# Patient Record
Sex: Male | Born: 1945 | Race: White | Hispanic: No | Marital: Single | Smoking: Current every day smoker
Health system: Southern US, Community
[De-identification: ages and names within clinical notes are randomized; demographics above are authoritative.]

## PROBLEM LIST (undated history)

## (undated) DIAGNOSIS — G459 Transient cerebral ischemic attack, unspecified: Secondary | ICD-10-CM

## (undated) DIAGNOSIS — Z8711 Personal history of peptic ulcer disease: Secondary | ICD-10-CM

## (undated) DIAGNOSIS — L408 Other psoriasis: Secondary | ICD-10-CM

## (undated) DIAGNOSIS — I2510 Atherosclerotic heart disease of native coronary artery without angina pectoris: Secondary | ICD-10-CM

## (undated) DIAGNOSIS — I498 Other specified cardiac arrhythmias: Secondary | ICD-10-CM

## (undated) DIAGNOSIS — R066 Hiccough: Secondary | ICD-10-CM

## (undated) DIAGNOSIS — E119 Type 2 diabetes mellitus without complications: Secondary | ICD-10-CM

## (undated) DIAGNOSIS — E785 Hyperlipidemia, unspecified: Secondary | ICD-10-CM

## (undated) DIAGNOSIS — I255 Ischemic cardiomyopathy: Secondary | ICD-10-CM

## (undated) DIAGNOSIS — I119 Hypertensive heart disease without heart failure: Secondary | ICD-10-CM

## (undated) DIAGNOSIS — I5022 Chronic systolic (congestive) heart failure: Secondary | ICD-10-CM

## (undated) DIAGNOSIS — E538 Deficiency of other specified B group vitamins: Secondary | ICD-10-CM

## (undated) DIAGNOSIS — G450 Vertebro-basilar artery syndrome: Secondary | ICD-10-CM

## (undated) DIAGNOSIS — K219 Gastro-esophageal reflux disease without esophagitis: Secondary | ICD-10-CM

## (undated) DIAGNOSIS — N200 Calculus of kidney: Secondary | ICD-10-CM

## (undated) DIAGNOSIS — I739 Peripheral vascular disease, unspecified: Secondary | ICD-10-CM

## (undated) DIAGNOSIS — I82409 Acute embolism and thrombosis of unspecified deep veins of unspecified lower extremity: Secondary | ICD-10-CM

## (undated) DIAGNOSIS — Z8719 Personal history of other diseases of the digestive system: Secondary | ICD-10-CM

## (undated) DIAGNOSIS — I639 Cerebral infarction, unspecified: Secondary | ICD-10-CM

## (undated) DIAGNOSIS — Z720 Tobacco use: Secondary | ICD-10-CM

## (undated) DIAGNOSIS — I6529 Occlusion and stenosis of unspecified carotid artery: Principal | ICD-10-CM

## (undated) HISTORY — PX: CORONARY ARTERY BYPASS GRAFT: SHX141

## (undated) HISTORY — DX: Atherosclerotic heart disease of native coronary artery without angina pectoris: I25.10

## (undated) HISTORY — DX: Other psoriasis: L40.8

## (undated) HISTORY — DX: Hyperlipidemia, unspecified: E78.5

## (undated) HISTORY — DX: Occlusion and stenosis of unspecified carotid artery: I65.29

## (undated) HISTORY — DX: Cerebral infarction, unspecified: I63.9

## (undated) HISTORY — DX: Gastro-esophageal reflux disease without esophagitis: K21.9

## (undated) HISTORY — PX: INSERT / REPLACE / REMOVE PACEMAKER: SUR710

## (undated) HISTORY — DX: Calculus of kidney: N20.0

## (undated) HISTORY — DX: Deficiency of other specified B group vitamins: E53.8

## (undated) HISTORY — DX: Hiccough: R06.6

## (undated) HISTORY — DX: Transient cerebral ischemic attack, unspecified: G45.9

## (undated) HISTORY — DX: Vertebro-basilar artery syndrome: G45.0

## (undated) HISTORY — DX: Acute embolism and thrombosis of unspecified deep veins of unspecified lower extremity: I82.409

## (undated) HISTORY — DX: Other specified cardiac arrhythmias: I49.8

## (undated) HISTORY — PX: CORONARY ANGIOPLASTY WITH STENT PLACEMENT: SHX49

## (undated) HISTORY — PX: KIDNEY STONE SURGERY: SHX686

## (undated) HISTORY — DX: Tobacco use: Z72.0

---

## 1997-06-28 ENCOUNTER — Inpatient Hospital Stay (HOSPITAL_COMMUNITY): Admission: AD | Admit: 1997-06-28 | Discharge: 1997-07-01 | Payer: Self-pay | Admitting: Cardiovascular Disease

## 1998-07-11 ENCOUNTER — Ambulatory Visit (HOSPITAL_COMMUNITY): Admission: RE | Admit: 1998-07-11 | Discharge: 1998-07-11 | Payer: Self-pay | Admitting: Cardiovascular Disease

## 1999-04-12 ENCOUNTER — Encounter: Payer: Self-pay | Admitting: Cardiovascular Disease

## 1999-04-12 ENCOUNTER — Ambulatory Visit (HOSPITAL_COMMUNITY): Admission: RE | Admit: 1999-04-12 | Discharge: 1999-04-12 | Payer: Self-pay | Admitting: Cardiovascular Disease

## 1999-05-04 ENCOUNTER — Encounter: Payer: Self-pay | Admitting: Emergency Medicine

## 1999-05-04 ENCOUNTER — Emergency Department (HOSPITAL_COMMUNITY): Admission: EM | Admit: 1999-05-04 | Discharge: 1999-05-04 | Payer: Self-pay | Admitting: Emergency Medicine

## 1999-06-04 ENCOUNTER — Encounter: Payer: Self-pay | Admitting: *Deleted

## 1999-06-04 ENCOUNTER — Emergency Department (HOSPITAL_COMMUNITY): Admission: EM | Admit: 1999-06-04 | Discharge: 1999-06-04 | Payer: Self-pay | Admitting: *Deleted

## 1999-11-02 ENCOUNTER — Inpatient Hospital Stay (HOSPITAL_COMMUNITY): Admission: EM | Admit: 1999-11-02 | Discharge: 1999-11-05 | Payer: Self-pay | Admitting: Emergency Medicine

## 2000-03-14 ENCOUNTER — Encounter: Payer: Self-pay | Admitting: Emergency Medicine

## 2000-03-15 ENCOUNTER — Inpatient Hospital Stay (HOSPITAL_COMMUNITY): Admission: EM | Admit: 2000-03-15 | Discharge: 2000-03-16 | Payer: Self-pay | Admitting: Emergency Medicine

## 2000-05-01 ENCOUNTER — Encounter: Payer: Self-pay | Admitting: Family Medicine

## 2000-05-01 ENCOUNTER — Encounter: Admission: RE | Admit: 2000-05-01 | Discharge: 2000-05-01 | Payer: Self-pay | Admitting: Family Medicine

## 2002-07-28 ENCOUNTER — Ambulatory Visit (HOSPITAL_COMMUNITY): Admission: RE | Admit: 2002-07-28 | Discharge: 2002-07-28 | Payer: Self-pay | Admitting: Cardiovascular Disease

## 2005-10-13 ENCOUNTER — Emergency Department (HOSPITAL_COMMUNITY): Admission: EM | Admit: 2005-10-13 | Discharge: 2005-10-14 | Payer: Self-pay | Admitting: Emergency Medicine

## 2006-01-31 ENCOUNTER — Inpatient Hospital Stay (HOSPITAL_COMMUNITY): Admission: EM | Admit: 2006-01-31 | Discharge: 2006-02-02 | Payer: Self-pay | Admitting: Emergency Medicine

## 2006-02-01 ENCOUNTER — Encounter: Payer: Self-pay | Admitting: Cardiology

## 2006-03-11 ENCOUNTER — Inpatient Hospital Stay (HOSPITAL_BASED_OUTPATIENT_CLINIC_OR_DEPARTMENT_OTHER): Admission: RE | Admit: 2006-03-11 | Discharge: 2006-03-11 | Payer: Self-pay | Admitting: Cardiovascular Disease

## 2006-09-18 ENCOUNTER — Inpatient Hospital Stay (HOSPITAL_BASED_OUTPATIENT_CLINIC_OR_DEPARTMENT_OTHER): Admission: RE | Admit: 2006-09-18 | Discharge: 2006-09-18 | Payer: Self-pay | Admitting: Cardiovascular Disease

## 2006-09-18 HISTORY — PX: CORONARY ANGIOPLASTY: SHX604

## 2007-08-15 ENCOUNTER — Inpatient Hospital Stay (HOSPITAL_COMMUNITY): Admission: EM | Admit: 2007-08-15 | Discharge: 2007-08-21 | Payer: Self-pay | Admitting: Emergency Medicine

## 2007-08-19 ENCOUNTER — Ambulatory Visit: Payer: Self-pay | Admitting: Physical Medicine & Rehabilitation

## 2007-08-19 ENCOUNTER — Encounter (INDEPENDENT_AMBULATORY_CARE_PROVIDER_SITE_OTHER): Payer: Self-pay | Admitting: Internal Medicine

## 2007-08-19 ENCOUNTER — Ambulatory Visit: Payer: Self-pay | Admitting: Vascular Surgery

## 2008-05-15 ENCOUNTER — Ambulatory Visit: Payer: Self-pay | Admitting: Cardiovascular Disease

## 2008-05-15 ENCOUNTER — Ambulatory Visit: Payer: Self-pay | Admitting: Internal Medicine

## 2008-05-15 ENCOUNTER — Inpatient Hospital Stay (HOSPITAL_COMMUNITY): Admission: EM | Admit: 2008-05-15 | Discharge: 2008-05-28 | Payer: Self-pay | Admitting: Emergency Medicine

## 2008-05-17 ENCOUNTER — Ambulatory Visit: Payer: Self-pay | Admitting: Surgery

## 2008-05-17 ENCOUNTER — Encounter (INDEPENDENT_AMBULATORY_CARE_PROVIDER_SITE_OTHER): Payer: Self-pay | Admitting: *Deleted

## 2008-06-25 ENCOUNTER — Ambulatory Visit: Payer: Self-pay | Admitting: Surgery

## 2008-06-25 ENCOUNTER — Encounter: Admission: RE | Admit: 2008-06-25 | Discharge: 2008-06-25 | Payer: Self-pay | Admitting: Surgery

## 2008-09-20 ENCOUNTER — Encounter: Payer: Self-pay | Admitting: Internal Medicine

## 2008-09-20 DIAGNOSIS — E119 Type 2 diabetes mellitus without complications: Secondary | ICD-10-CM | POA: Insufficient documentation

## 2008-09-20 DIAGNOSIS — L408 Other psoriasis: Secondary | ICD-10-CM

## 2008-09-20 DIAGNOSIS — G450 Vertebro-basilar artery syndrome: Secondary | ICD-10-CM | POA: Insufficient documentation

## 2008-09-20 DIAGNOSIS — I10 Essential (primary) hypertension: Secondary | ICD-10-CM

## 2008-09-20 DIAGNOSIS — G459 Transient cerebral ischemic attack, unspecified: Secondary | ICD-10-CM | POA: Insufficient documentation

## 2008-09-20 DIAGNOSIS — I498 Other specified cardiac arrhythmias: Secondary | ICD-10-CM | POA: Insufficient documentation

## 2008-09-20 DIAGNOSIS — I119 Hypertensive heart disease without heart failure: Secondary | ICD-10-CM

## 2008-09-20 DIAGNOSIS — I2510 Atherosclerotic heart disease of native coronary artery without angina pectoris: Secondary | ICD-10-CM | POA: Insufficient documentation

## 2008-09-20 HISTORY — DX: Hypertensive heart disease without heart failure: I11.9

## 2008-09-20 HISTORY — DX: Vertebro-basilar artery syndrome: G45.0

## 2008-09-20 HISTORY — DX: Transient cerebral ischemic attack, unspecified: G45.9

## 2008-09-20 HISTORY — DX: Other psoriasis: L40.8

## 2008-10-08 ENCOUNTER — Ambulatory Visit: Payer: Self-pay | Admitting: Internal Medicine

## 2008-10-08 DIAGNOSIS — Z950 Presence of cardiac pacemaker: Secondary | ICD-10-CM

## 2008-12-17 ENCOUNTER — Emergency Department (HOSPITAL_COMMUNITY): Admission: EM | Admit: 2008-12-17 | Discharge: 2008-12-17 | Payer: Self-pay | Admitting: Emergency Medicine

## 2009-05-19 ENCOUNTER — Ambulatory Visit: Payer: Self-pay | Admitting: Internal Medicine

## 2009-08-18 ENCOUNTER — Encounter: Admission: RE | Admit: 2009-08-18 | Discharge: 2009-08-18 | Payer: Self-pay | Admitting: Neurology

## 2009-10-14 ENCOUNTER — Ambulatory Visit: Payer: Self-pay | Admitting: Cardiology

## 2010-03-25 ENCOUNTER — Encounter: Payer: Self-pay | Admitting: Internal Medicine

## 2010-03-26 ENCOUNTER — Encounter: Payer: Self-pay | Admitting: Neurology

## 2010-03-26 ENCOUNTER — Encounter: Payer: Self-pay | Admitting: Surgery

## 2010-04-04 NOTE — Cardiovascular Report (Signed)
Summary: Office Visit   Office Visit   Imported By: Roderic Ovens 06/01/2009 15:56:33  _____________________________________________________________________  External Attachment:    Type:   Image     Comment:   External Document

## 2010-04-04 NOTE — Assessment & Plan Note (Signed)
Summary: medtronic check/sl   Visit Type:  Follow-up Primary Provider:  Laren Boom, MD, [facility]   History of Present Illness: [name] returns today for followup of his PPM.  He is a pleasant middle aged man with a h/o CAD and stroke who developed bradycardia and underwent PPM in 05/2008.  He returns today with complaints of hiccups.   No c/p or sob.    Current Medications (verified): 1)  Hydrocodone-Acetaminophen 5-500 Mg Tabs (Hydrocodone-Acetaminophen) .... Take One Tablet At Bedtime 2)  Glimepiride 4 Mg Tabs (Glimepiride) .... Take 1 or 1/2 Tablets By Mouth Once Daily 3)  Ranitidine Hcl 150 Mg Caps (Ranitidine Hcl) .... Once Daily 4)  Pantoprazole Sodium 40 Mg Tbec (Pantoprazole Sodium) .... Once Daily 5)  Zetia 10 Mg Tabs (Ezetimibe) .... Once Daily 6)  Lipitor 80 Mg Tabs (Atorvastatin Calcium) .... Once Daily 7)  Toprol Xl 50 Mg Xr24h-Tab (Metoprolol Succinate) .... One Full Tablet Once Daily 8)  Lisinopril-Hydrochlorothiazide 20-12.5 Mg Tabs (Lisinopril-Hydrochlorothiazide) .... Take One Tablet Once Daily 9)  Nitrostat 0.4 Mg Subl (Nitroglycerin) .Marland Kitchen.. 1 Tablet Under Tongue At Onset of Chest Pain; You May Repeat Every 5 Minutes For Up To 3 Doses.  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         c/o hiccups.  Vital Signs:  Patient profile:   65 year old male Height:      64 inches Weight:      186 pounds BMI:     32.04 Pulse rate:    87 / minute BP sitting:   104 / 62  (left arm)  Vitals Entered By: Laurance Flatten CMA (May 19, 2009 3:21 PM) Comments Pt did not bring a current med list to this visit. Nor does he know what medications he is taking.   Physical Exam  General:  Well developed, well nourished, in no acute distress. Head:  normocephalic and atraumatic Eyes:  PERRLA/EOM intact; conjunctiva and lids normal. Mouth:  Teeth, gums and palate normal. Oral mucosa normal. Neck:  Neck supple, no JVD. No masses, thyromegaly or abnormal cervical nodes. Chest Wall:  no deformities or breast masses noted Lungs:  Clear bilaterally to auscultation.  No wheezes, rales or rhonchi. Heart:  Regular tachycardia with normal S1 and S2.  PMI is enlarged and laterally displaced.  No murmurs are present. Abdomen:  Bowel sounds positive; abdomen soft and non-tender without masses, organomegaly, or hernias noted. No hepatosplenomegaly. Msk:  Back normal, normal gait. Muscle strength and tone normal. Pulses:  pulses normal in all 4 extremities Extremities:  No clubbing or cyanosis. Neurologic:  Alert and oriented x 3.   PPM Specifications Following MD:  Lewayne Bunting, MD     Referring MD:  [facility] Vendor:  Medtronic     PPM Model Number:  ADDRL1     PPM Serial Number:  [id number] [facility] PPM DOI:  05/27/2008     PPM Implanting MD:  Lewayne Bunting, MD  Lead 1    Location: RA     DOI: 05/27/2008     Model #: 9147  Serial #: [id number]     Status: active Lead 2    Location: RV     DOI: 05/27/2008     Model #: [id number]     Serial #: [id number]     Status: active  Magnet Response Rate:  BOL  85 ERI  65  Indications:  BRADYCARDIA AFTER CABG   PPM Follow Up Remote Check?  No Battery Voltage:  2.79 V     Battery Est. Longevity:  10 years     Pacer Dependent:  No       PPM Device Measurements Atrium  Amplitude: 2.8 mV, Impedance: 611 ohms, Threshold: 1.0 V at 0.4 msec Right Ventricle  Amplitude: 16 mV, Impedance: 676 ohms, Threshold:  0.75 V at 0.4 msec  Episodes MS Episodes:  1     Percent Mode Switch:  <0.1%     Coumadin:  No Ventricular High Rate:  4     Atrial Pacing:  27.2%     Ventricular Pacing:  0.4%  Parameters Mode:  DDDR+     Lower Rate Limit:  60     Upper Rate Limit:  130 Paced AV Delay:  150     Sensed AV Delay:  120 Next Cardiology Appt Due:  11/03/2009 Tech Comments:  No parameter changes.  Device function normal.  4 VHR 5 seconds in duration.  130,394 single PVC's.  No Carelink @ this time.  ROV 6 months clinic. Altha Harm, LPN  May 19, 2009 3:36 PM  MD Comments:  Agree with above.  Impression & Recommendations:  Problem # 1:  CARDIAC PACEMAKER IN SITU (ICD-V45.01) His device is working normally.  Will recheck in several months.  Problem # 2:  HYPERTENSION (ICD-401.9) His blood pressure is well controlled. Continue his current meds and maintain a low sodium diet. His updated medication list for this problem includes:    Toprol Xl 50 Mg Xr24h-tab (Metoprolol succinate) ..... One full tablet once daily    Lisinopril-hydrochlorothiazide 20-12.5 Mg Tabs (Lisinopril-hydrochlorothiazide) .Marland Kitchen... Take one tablet once daily  Problem # 3:  CAD (ICD-414.00) He denies anginal symptoms.  Continue current meds.  I have asked him to stop smoking and spent time encouraging him on the importance of stopping. His updated medication list for this problem includes:    Toprol Xl 50 Mg Xr24h-tab (Metoprolol succinate) ..... One full tablet once daily    Lisinopril-hydrochlorothiazide 20-12.5 Mg Tabs (Lisinopril-hydrochlorothiazide) .Marland Kitchen... Take one tablet once daily    Nitrostat 0.4 Mg Subl (Nitroglycerin) .Marland Kitchen... 1 tablet under tongue at onset of chest pain; you may repeat every 5 minutes for up to 3 doses.  Patient Instructions: 1)  Your physician recommends that you schedule a follow-up appointment in: 6 months with device clinc and 12 months with Dr Ladona Ridgel

## 2010-04-06 NOTE — Miscellaneous (Signed)
Summary: corrected device information  Clinical Lists Changes  Observations: Added new observation of PPM SERL#: [id number] (03/25/2010 11:00)      PPM Specifications Following MD:  Lewayne Bunting, MD     Referring MD:  [facility] PPM Vendor:  Medtronic     PPM Model Number:  ADDRL1     PPM Serial Number:  [id number] PPM DOI:  05/27/2008     PPM Implanting MD:  Lewayne Bunting, MD  Lead 1    Location: RA     DOI: 05/27/2008     Model #: [id number]     Serial #: [id number]     Status: active Lead 2    Location: RV     DOI: 05/27/2008     Model #: 8469     Serial #: [id number]     Status: active  Magnet Response Rate:  BOL  85 ERI  65  Indications:  BRADYCARDIA AFTER CABG   PPM Follow Up Pacer Dependent:  No      Episodes Coumadin:  No  Parameters Mode:  DDDR+     Lower Rate Limit:  60     Upper Rate Limit:  130 Paced AV Delay:  150     Sensed AV Delay:  120

## 2010-06-09 LAB — URINALYSIS, ROUTINE W REFLEX MICROSCOPIC
Bilirubin Urine: NEGATIVE
Ketones, ur: 15 mg/dL — AB
Nitrite: NEGATIVE
Protein, ur: 100 mg/dL — AB
pH: 7.5 (ref 5.0–8.0)

## 2010-06-09 LAB — URINE MICROSCOPIC-ADD ON

## 2010-06-09 LAB — DIFFERENTIAL
Basophils Relative: 0 % (ref 0–1)
Eosinophils Absolute: 0 10*3/uL (ref 0.0–0.7)
Neutrophils Relative %: 89 % — ABNORMAL HIGH (ref 43–77)

## 2010-06-09 LAB — COMPREHENSIVE METABOLIC PANEL
ALT: 13 U/L (ref 0–53)
Alkaline Phosphatase: 106 U/L (ref 39–117)
BUN: 7 mg/dL (ref 6–23)
CO2: 27 mEq/L (ref 19–32)
Calcium: 9.9 mg/dL (ref 8.4–10.5)
GFR calc non Af Amer: 58 mL/min — ABNORMAL LOW (ref >60)
Glucose, Bld: 226 mg/dL — ABNORMAL HIGH (ref 70–99)
Potassium: 3.4 mEq/L — ABNORMAL LOW (ref 3.5–5.1)
Sodium: 137 mEq/L (ref 135–145)

## 2010-06-09 LAB — CBC
HCT: 50.2 % (ref 39.0–52.0)
Hemoglobin: 17.3 g/dL — ABNORMAL HIGH (ref 13.0–17.0)
MCHC: 34.5 g/dL (ref 30.0–36.0)
RBC: 5.62 MIL/uL (ref 4.22–5.81)

## 2010-06-09 LAB — LIPASE, BLOOD: Lipase: 24 U/L (ref 11–59)

## 2010-06-14 ENCOUNTER — Other Ambulatory Visit: Payer: Self-pay | Admitting: Cardiology

## 2010-06-14 DIAGNOSIS — E785 Hyperlipidemia, unspecified: Secondary | ICD-10-CM

## 2010-06-15 LAB — CBC
HCT: 32.5 % — ABNORMAL LOW (ref 39.0–52.0)
HCT: 35.1 % — ABNORMAL LOW (ref 39.0–52.0)
HCT: 37.9 % — ABNORMAL LOW (ref 39.0–52.0)
HCT: 40.8 % (ref 39.0–52.0)
HCT: 41.3 % (ref 39.0–52.0)
HCT: 43.1 % (ref 39.0–52.0)
Hemoglobin: 11.1 g/dL — ABNORMAL LOW (ref 13.0–17.0)
Hemoglobin: 11.6 g/dL — ABNORMAL LOW (ref 13.0–17.0)
Hemoglobin: 11.8 g/dL — ABNORMAL LOW (ref 13.0–17.0)
Hemoglobin: 12.2 g/dL — ABNORMAL LOW (ref 13.0–17.0)
Hemoglobin: 13 g/dL (ref 13.0–17.0)
Hemoglobin: 14.6 g/dL (ref 13.0–17.0)
Hemoglobin: 14.6 g/dL (ref 13.0–17.0)
MCHC: 34.3 g/dL (ref 30.0–36.0)
MCHC: 34.3 g/dL (ref 30.0–36.0)
MCHC: 34.3 g/dL (ref 30.0–36.0)
MCHC: 34.4 g/dL (ref 30.0–36.0)
MCHC: 34.6 g/dL (ref 30.0–36.0)
MCHC: 34.8 g/dL (ref 30.0–36.0)
MCHC: 34.8 g/dL (ref 30.0–36.0)
MCV: 88.7 fL (ref 78.0–100.0)
MCV: 88.7 fL (ref 78.0–100.0)
MCV: 89 fL (ref 78.0–100.0)
MCV: 90.8 fL (ref 78.0–100.0)
MCV: 91 fL (ref 78.0–100.0)
MCV: 91.1 fL (ref 78.0–100.0)
MCV: 89.8 fL (ref 78.0–100.0)
Platelets: 198 10*3/uL (ref 150–400)
Platelets: 203 10*3/uL (ref 150–400)
Platelets: 179 10*3/uL (ref 150–400)
Platelets: 186 10*3/uL (ref 150–400)
Platelets: 206 10*3/uL (ref 150–400)
RBC: 3.57 MIL/uL — ABNORMAL LOW (ref 4.22–5.81)
RBC: 3.7 MIL/uL — ABNORMAL LOW (ref 4.22–5.81)
RBC: 3.78 MIL/uL — ABNORMAL LOW (ref 4.22–5.81)
RBC: 4.27 MIL/uL (ref 4.22–5.81)
RBC: 4.66 MIL/uL (ref 4.22–5.81)
RBC: 4.83 MIL/uL (ref 4.22–5.81)
RBC: 4.69 MIL/uL (ref 4.22–5.81)
RDW: 13.5 % (ref 11.5–15.5)
RDW: 13.6 % (ref 11.5–15.5)
RDW: 14 % (ref 11.5–15.5)
RDW: 14.1 % (ref 11.5–15.5)
RDW: 13.5 % (ref 11.5–15.5)
WBC: 11.1 10*3/uL — ABNORMAL HIGH (ref 4.0–10.5)
WBC: 6.9 10*3/uL (ref 4.0–10.5)
WBC: 7.6 10*3/uL (ref 4.0–10.5)
WBC: 5.1 10*3/uL (ref 4.0–10.5)
WBC: 7.1 10*3/uL (ref 4.0–10.5)
WBC: 7.2 10*3/uL (ref 4.0–10.5)

## 2010-06-15 LAB — POCT I-STAT 3, ART BLOOD GAS (G3+)
Acid-base deficit: 1 mmol/L (ref 0.0–2.0)
Acid-base deficit: 4 mmol/L — ABNORMAL HIGH (ref 0.0–2.0)
Acid-base deficit: 5 mmol/L — ABNORMAL HIGH (ref 0.0–2.0)
Bicarbonate: 20.4 mEq/L (ref 20.0–24.0)
Bicarbonate: 21.6 mEq/L (ref 20.0–24.0)
O2 Saturation: 100 %
O2 Saturation: 90 %
O2 Saturation: 99 %
Patient temperature: 30.5
TCO2: 21 mmol/L (ref 0–100)
TCO2: 24 mmol/L (ref 0–100)
pCO2 arterial: 33.3 mmHg — ABNORMAL LOW (ref 35.0–45.0)
pCO2 arterial: 34 mmHg — ABNORMAL LOW (ref 35.0–45.0)
pCO2 arterial: 43.3 mmHg (ref 35.0–45.0)
pH, Arterial: 7.385 (ref 7.350–7.450)
pH, Arterial: 7.45 (ref 7.350–7.450)
pO2, Arterial: 61 mmHg — ABNORMAL LOW (ref 80.0–100.0)
pO2, Arterial: 88 mmHg (ref 80.0–100.0)

## 2010-06-15 LAB — COMPREHENSIVE METABOLIC PANEL
ALT: 15 U/L (ref 0–53)
AST: 16 U/L (ref 0–37)
Albumin: 3.2 g/dL — ABNORMAL LOW (ref 3.5–5.2)
CO2: 26 mEq/L (ref 19–32)
Chloride: 104 mEq/L (ref 96–112)
GFR calc Af Amer: >60 mL/min (ref >60)
GFR calc non Af Amer: 59 mL/min — ABNORMAL LOW (ref >60)
Potassium: 3.2 mEq/L — ABNORMAL LOW (ref 3.5–5.1)
Sodium: 140 mEq/L (ref 135–145)
Total Bilirubin: 0.5 mg/dL (ref 0.3–1.2)

## 2010-06-15 LAB — BASIC METABOLIC PANEL
BUN: 8 mg/dL (ref 6–23)
CO2: 22 mEq/L (ref 19–32)
CO2: 23 mEq/L (ref 19–32)
CO2: 25 mEq/L (ref 19–32)
Calcium: 7.8 mg/dL — ABNORMAL LOW (ref 8.4–10.5)
Calcium: 8.7 mg/dL (ref 8.4–10.5)
Calcium: 8.8 mg/dL (ref 8.4–10.5)
Calcium: 8.9 mg/dL (ref 8.4–10.5)
Chloride: 103 mEq/L (ref 96–112)
Chloride: 105 mEq/L (ref 96–112)
Creatinine, Ser: 1.14 mg/dL (ref 0.4–1.5)
Creatinine, Ser: 1.21 mg/dL (ref 0.4–1.5)
Creatinine, Ser: 1.28 mg/dL (ref 0.4–1.5)
Creatinine, Ser: 1.3 mg/dL (ref 0.4–1.5)
Creatinine, Ser: 1.31 mg/dL (ref 0.4–1.5)
GFR calc Af Amer: >60 mL/min (ref >60)
GFR calc Af Amer: >60 mL/min (ref >60)
GFR calc Af Amer: >60 mL/min (ref >60)
GFR calc Af Amer: >60 mL/min (ref >60)
GFR calc Af Amer: >60 mL/min (ref >60)
GFR calc Af Amer: >60 mL/min (ref >60)
GFR calc Af Amer: >60 mL/min (ref >60)
GFR calc non Af Amer: 57 mL/min — ABNORMAL LOW (ref >60)
GFR calc non Af Amer: >60 mL/min (ref >60)
GFR calc non Af Amer: >60 mL/min (ref >60)
GFR calc non Af Amer: 58 mL/min — ABNORMAL LOW (ref >60)
GFR calc non Af Amer: >60 mL/min (ref >60)
Potassium: 3.4 mEq/L — ABNORMAL LOW (ref 3.5–5.1)
Potassium: 4.3 mEq/L (ref 3.5–5.1)
Potassium: 3.5 mEq/L (ref 3.5–5.1)
Potassium: 3.7 mEq/L (ref 3.5–5.1)
Sodium: 134 mEq/L — ABNORMAL LOW (ref 135–145)
Sodium: 137 mEq/L (ref 135–145)
Sodium: 138 mEq/L (ref 135–145)
Sodium: 140 mEq/L (ref 135–145)
Sodium: 139 mEq/L (ref 135–145)

## 2010-06-15 LAB — GLUCOSE, CAPILLARY
Glucose-Capillary: 112 mg/dL — ABNORMAL HIGH (ref 70–99)
Glucose-Capillary: 118 mg/dL — ABNORMAL HIGH (ref 70–99)
Glucose-Capillary: 118 mg/dL — ABNORMAL HIGH (ref 70–99)
Glucose-Capillary: 119 mg/dL — ABNORMAL HIGH (ref 70–99)
Glucose-Capillary: 120 mg/dL — ABNORMAL HIGH (ref 70–99)
Glucose-Capillary: 120 mg/dL — ABNORMAL HIGH (ref 70–99)
Glucose-Capillary: 122 mg/dL — ABNORMAL HIGH (ref 70–99)
Glucose-Capillary: 126 mg/dL — ABNORMAL HIGH (ref 70–99)
Glucose-Capillary: 128 mg/dL — ABNORMAL HIGH (ref 70–99)
Glucose-Capillary: 132 mg/dL — ABNORMAL HIGH (ref 70–99)
Glucose-Capillary: 136 mg/dL — ABNORMAL HIGH (ref 70–99)
Glucose-Capillary: 139 mg/dL — ABNORMAL HIGH (ref 70–99)
Glucose-Capillary: 153 mg/dL — ABNORMAL HIGH (ref 70–99)
Glucose-Capillary: 155 mg/dL — ABNORMAL HIGH (ref 70–99)
Glucose-Capillary: 178 mg/dL — ABNORMAL HIGH (ref 70–99)
Glucose-Capillary: 219 mg/dL — ABNORMAL HIGH (ref 70–99)
Glucose-Capillary: 73 mg/dL (ref 70–99)
Glucose-Capillary: 75 mg/dL (ref 70–99)
Glucose-Capillary: 85 mg/dL (ref 70–99)
Glucose-Capillary: 87 mg/dL (ref 70–99)
Glucose-Capillary: 94 mg/dL (ref 70–99)
Glucose-Capillary: 96 mg/dL (ref 70–99)
Glucose-Capillary: 96 mg/dL (ref 70–99)
Glucose-Capillary: 149 mg/dL — ABNORMAL HIGH (ref 70–99)
Glucose-Capillary: 159 mg/dL — ABNORMAL HIGH (ref 70–99)
Glucose-Capillary: 161 mg/dL — ABNORMAL HIGH (ref 70–99)
Glucose-Capillary: 176 mg/dL — ABNORMAL HIGH (ref 70–99)
Glucose-Capillary: 191 mg/dL — ABNORMAL HIGH (ref 70–99)
Glucose-Capillary: 197 mg/dL — ABNORMAL HIGH (ref 70–99)

## 2010-06-15 LAB — DIFFERENTIAL
Basophils Absolute: 0 10*3/uL (ref 0.0–0.1)
Basophils Relative: 1 % (ref 0–1)
Eosinophils Absolute: 0.1 10*3/uL (ref 0.0–0.7)
Lymphs Abs: 1.4 10*3/uL (ref 0.7–4.0)
Neutrophils Relative %: 62 % (ref 43–77)

## 2010-06-15 LAB — LIPID PANEL
Cholesterol: 268 mg/dL — ABNORMAL HIGH (ref 0–200)
HDL: 24 mg/dL — ABNORMAL LOW (ref >39)
Total CHOL/HDL Ratio: 11.2 RATIO
Triglycerides: 600 mg/dL — ABNORMAL HIGH (ref <150)

## 2010-06-15 LAB — POCT I-STAT, CHEM 8
BUN: 18 mg/dL (ref 6–23)
BUN: 9 mg/dL (ref 6–23)
Chloride: 107 mEq/L (ref 96–112)
Creatinine, Ser: 1.2 mg/dL (ref 0.4–1.5)
Creatinine, Ser: 1.6 mg/dL — ABNORMAL HIGH (ref 0.4–1.5)
Glucose, Bld: 133 mg/dL — ABNORMAL HIGH (ref 70–99)
Potassium: 4.5 mEq/L (ref 3.5–5.1)
Potassium: 4.6 mEq/L (ref 3.5–5.1)
Sodium: 138 mEq/L (ref 135–145)
Sodium: 138 mEq/L (ref 135–145)

## 2010-06-15 LAB — APTT: aPTT: 22 seconds — ABNORMAL LOW (ref 24–37)

## 2010-06-15 LAB — POCT I-STAT 4, (NA,K, GLUC, HGB,HCT)
Glucose, Bld: 106 mg/dL — ABNORMAL HIGH (ref 70–99)
Glucose, Bld: 109 mg/dL — ABNORMAL HIGH (ref 70–99)
Glucose, Bld: 110 mg/dL — ABNORMAL HIGH (ref 70–99)
HCT: 28 % — ABNORMAL LOW (ref 39.0–52.0)
HCT: 37 % — ABNORMAL LOW (ref 39.0–52.0)
HCT: 39 % (ref 39.0–52.0)
HCT: 42 % (ref 39.0–52.0)
Hemoglobin: 11.2 g/dL — ABNORMAL LOW (ref 13.0–17.0)
Hemoglobin: 12.6 g/dL — ABNORMAL LOW (ref 13.0–17.0)
Hemoglobin: 14.3 g/dL (ref 13.0–17.0)
Potassium: 4 mEq/L (ref 3.5–5.1)
Sodium: 132 mEq/L — ABNORMAL LOW (ref 135–145)
Sodium: 135 mEq/L (ref 135–145)
Sodium: 136 mEq/L (ref 135–145)
Sodium: 137 mEq/L (ref 135–145)
Sodium: 139 mEq/L (ref 135–145)

## 2010-06-15 LAB — HEPARIN LEVEL (UNFRACTIONATED)
Heparin Unfractionated: 0.36 IU/mL (ref 0.30–0.70)
Heparin Unfractionated: 0.46 IU/mL (ref 0.30–0.70)
Heparin Unfractionated: <0.1 IU/mL — ABNORMAL LOW (ref 0.30–0.70)

## 2010-06-15 LAB — BLOOD GAS, ARTERIAL
Bicarbonate: 20.3 mEq/L (ref 20.0–24.0)
Drawn by: 12971
O2 Saturation: 94.5 %
pCO2 arterial: 32.7 mmHg — ABNORMAL LOW (ref 35.0–45.0)
pH, Arterial: 7.409 (ref 7.350–7.450)
pO2, Arterial: 71.9 mmHg — ABNORMAL LOW (ref 80.0–100.0)

## 2010-06-15 LAB — POCT I-STAT 3, VENOUS BLOOD GAS (G3P V)
O2 Saturation: 87 %
pCO2, Ven: 36.8 mmHg — ABNORMAL LOW (ref 45.0–50.0)
pH, Ven: 7.395 — ABNORMAL HIGH (ref 7.250–7.300)
pO2, Ven: 38 mmHg (ref 30.0–45.0)

## 2010-06-15 LAB — CARDIAC PANEL(CRET KIN+CKTOT+MB+TROPI)
Total CK: 16 U/L (ref 7–232)
Troponin I: 0.42 ng/mL — ABNORMAL HIGH (ref 0.00–0.06)

## 2010-06-15 LAB — URINE MICROSCOPIC-ADD ON

## 2010-06-15 LAB — BRAIN NATRIURETIC PEPTIDE: Pro B Natriuretic peptide (BNP): 495 pg/mL — ABNORMAL HIGH (ref 0.0–100.0)

## 2010-06-15 LAB — CK TOTAL AND CKMB (NOT AT ARMC)
CK, MB: 2.8 ng/mL (ref 0.3–4.0)
Relative Index: INVALID (ref 0.0–2.5)
Total CK: 46 U/L (ref 7–232)

## 2010-06-15 LAB — MAGNESIUM
Magnesium: 2.6 mg/dL — ABNORMAL HIGH (ref 1.5–2.5)
Magnesium: 3 mg/dL — ABNORMAL HIGH (ref 1.5–2.5)

## 2010-06-15 LAB — URINALYSIS, ROUTINE W REFLEX MICROSCOPIC
Bilirubin Urine: NEGATIVE
Glucose, UA: NEGATIVE mg/dL
Ketones, ur: 15 mg/dL — AB
Protein, ur: NEGATIVE mg/dL
pH: 6.5 (ref 5.0–8.0)

## 2010-06-15 LAB — TROPONIN I: Troponin I: 0.2 ng/mL — ABNORMAL HIGH (ref 0.00–0.06)

## 2010-06-15 LAB — HEPATIC FUNCTION PANEL
Albumin: 2.5 g/dL — ABNORMAL LOW (ref 3.5–5.2)
Total Protein: 5.7 g/dL — ABNORMAL LOW (ref 6.0–8.3)

## 2010-06-15 LAB — VALPROIC ACID LEVEL: Valproic Acid Lvl: 54.2 ug/mL (ref 50.0–100.0)

## 2010-06-15 LAB — PLATELET COUNT: Platelets: 180 10*3/uL (ref 150–400)

## 2010-06-15 LAB — TYPE AND SCREEN
ABO/RH(D): A POS
Antibody Screen: NEGATIVE

## 2010-06-15 LAB — CREATININE, SERUM: GFR calc Af Amer: >60 mL/min (ref >60)

## 2010-06-15 LAB — TSH: TSH: 1.654 u[IU]/mL (ref 0.350–4.500)

## 2010-06-15 LAB — PROTIME-INR: INR: 1 (ref 0.00–1.49)

## 2010-07-13 ENCOUNTER — Other Ambulatory Visit: Payer: Self-pay | Admitting: *Deleted

## 2010-07-13 DIAGNOSIS — R066 Hiccough: Secondary | ICD-10-CM

## 2010-07-13 MED ORDER — PANTOPRAZOLE SODIUM 40 MG PO TBEC: 40.0000 mg | DELAYED_RELEASE_TABLET | Freq: Every day | ORAL | Status: DC

## 2010-07-13 NOTE — Telephone Encounter (Signed)
Faxed refill for pantoprazole to [facility] [place]

## 2010-07-18 ENCOUNTER — Other Ambulatory Visit: Payer: Self-pay | Admitting: Cardiology

## 2010-07-18 DIAGNOSIS — E7800 Pure hypercholesterolemia, unspecified: Secondary | ICD-10-CM

## 2010-07-18 NOTE — Discharge Summary (Signed)
[name], [name]               ACCOUNT NO.:  [id number]   MEDICAL RECORD NO.:  [id number]          PATIENT TYPE:  INP   LOCATION:  2022                         FACILITY:  [facility]   PHYSICIAN:  Evelene Croon, M.D.     DATE OF BIRTH:  Dec 21, 1945   DATE OF ADMISSION:  05/15/2008  DATE OF DISCHARGE:                               DISCHARGE SUMMARY   FINAL DIAGNOSIS:  Severe three-vessel coronary artery disease.   IN-HOSPITAL DIAGNOSES:  1. Postoperative bradycardia.  2. Diabetes mellitus, uncontrolled with a hemoglobin A1c of 10.3      preoperatively.  3. 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  4. Mild volume overload postoperatively.   SECONDARY DIAGNOSES:  1. History of heavy smoking.  2. Diabetes.  3. Hypertension.  4. Cerebrovascular disease status post transient ischemic attack in      2007 and stroke June 2009 with left hemiparesis.  The patient made      fairly good recovery from that stroke and is ambulatory with      chronic hiccups.  5. Medical noncompliance.  6. History of multivessel coronary artery disease status post      myocardial infarction and stenting in the past.  7. 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  8. History of psoriasis.  9. History of kidney stones status post surgery.   IN-HOSPITAL OPERATIONS AND PROCEDURES:  1. Cardiac catheterization.  2. Coronary artery bypass grafting x5 using a left internal mammary      artery graft to left anterior descending coronary artery, saphenous      vein graft to second diagonal branch of left anterior descending,      saphenous vein graft to posterior descending branch of right      coronary artery, sequential saphenous vein graft to first and third      marginal branches of the left circumflex coronary artery.      Endoscopic vein harvesting from right leg was done.   HISTORY AND PHYSICAL AND HOSPITAL COURSE:  The patient is a 65 year old  noncompliant diabetic, hypertensive heavy smoker with a known history of  coronary artery disease status post stenting in the past.  He also  suffered a stroke in June 2006 and history of TIA in 2007.  He now  presents with intermittent substernal chest pain.  A non-ST-segment  elevation MI.  Cardiac catheterization was done by Dr. Reyes Ivan which  showed severe three-vessel coronary artery disease.  Ejection fraction  was 40%.  Following cardiac catheterization, Dr. Laneta Simmers was consulted.  Dr. Laneta Simmers discussed with the patient undergoing coronary artery bypass  grafting.  He discussed risks and benefits with the patient.  The  patient acknowledged understanding and agreed to proceed.  Surgery was  scheduled for May 21, 2008.  Preoperatively, the patient underwent  bilateral carotid duplex ultrasound showing bilateral 60-80% ICA  stenosis.  He also had preoperative ABIs showing greater than 1.0.  Preoperative hemoglobin A1c was elevated at 10.3.  The patient remained  stable preoperatively.  For details of the patient's past medical  history and  physical exam, please see dictated H and P.   The patient was taken to the operating room on May 21, 2008 where he  underwent coronary artery bypass grafting x5 using a left internal  mammary artery graft to left anterior descending coronary artery,  saphenous vein graft to second diagonal branch to the left anterior  descending, saphenous vein graft to posterior descending branch of the  right coronary artery, sequential saphenous vein graft to first and  third marginal branches of the left circumflex coronary artery.  Endoscopic vein harvesting was done from the right leg.  The patient  tolerated this procedure and was transferred to the Intensive Care Unit  in stable condition.  Postoperatively, the patient was noted to be  hemodynamically stable.  He was extubated on the evening of the surgery.  Post-extubation, the patient was noted to be  alert and oriented x4.  While in the Intensive Care Unit, the patient was noted to be  vasodilated.  He was on Neoral and this was able to be weaned and  discontinued.  His blood pressure was noted to be stable.  He was  started on a low-dose beta blocker.  The patient was in normal sinus  rhythm.  Chest x-ray done postop day #1 was clear.  He had minimal  drainage from chest tubes and chest tubes discontinued in normal  fashion.  The patient was noted preoperatively to have poorly controlled  diabetes with a hemoglobin A1c of 10.3.  He was continued on Lantus  insulin and blood sugars were followed closely.  His  hemoglobin/hematocrit were stable at 11.8 and 34.3%.  He had mild volume  overload and received several doses of Lasix.  The patient was  ambulating well with cardiac rehab.  He was felt to be stable and  transferred out to 2000 on postop day #2.  While on telemetry floor, the  patient's vitals continued to be monitored.  His blood pressure remained  stable.  Unfortunately, the patient had several episodes of bradycardia  with heart rate down to the 20s-30s.  His first episode was associated  with nausea and felt to be a vagal event.  His Lopressor was  discontinued and external pacer was turned on.  Cardiology was consulted  and agreed with a possible vagal event.  His heart rate was monitored.  Unfortunately, the patient had several more episodes of bradycardia over  the next several days.  Due to persistent bradycardia, it was felt that  the patient will require a permanent pacemaker.  The patient was placed  n.p.o. and on May 27, 2008 a dual-chamber pacemaker was implanted  without difficulty.  It was working appropriately.  On May 27, 2008,  the patient was restarted on Norvasc for blood pressure.  We will  continue to monitor and may restart the patient on Lopressor prior to  discharge.  We will continue to monitor the patient's blood pressure.  During this time, the  patient remained hemodynamically stable.  His  volume overload improved.  The patient was below his preoperative weight  by 1 kg on May 27, 2008.  The patient's blood sugars were continued to  be monitored.  Lasix insulin was discontinued and he was started on  metformin 1000 mg b.i.d.  Blood sugars were followed closely and they  remained stable.  All incisions were noted to be clean, dry, and intact  and healing well.  The patient was out of bed and ambulating well  without difficulty.  During this time from pulmonary function, the  patient remained stable.  He was able to be weaned off oxygen saturating  greater than 90% on room air.   On May 27, 2008, the patient's vital signs were noted to be stable.  He was afebrile.  Dual-chamber Pacemaker was working appropriately.  Blood pressure was stable.  Saturating greater than 90% on room air.  Most recent lab work showed sodium of 140, potassium of 3.9, chloride of  103, bicarbonate of 30, BUN of 13, creatinine of 1.2, and glucose of 94.  White blood cell count of 11.1, hemoglobin of 11.1, hematocrit 32.5, and  platelet count was 140.  The patient is tentatively ready for discharge  home in the a.m. pending he remained stable and interrogation of  pacemaker stable.  We will plan to discontinue the patient's external  pacing wires prior to discharge.   FOLLOWUP APPOINTMENTS:  A followup appointment has been arranged with  Dr. Laneta Simmers for June 15, 2008 at 1 o'clock p.m.  The patient will need  to obtain PMI chest x-ray 30 minutes prior to this appointment.  The  patient will need to follow up with Dr. Patty Sermons in 2 weeks.  He need  to contact Dr. Yevonne Pax office to make these arrangements.   ACTIVITY:  The patient was instructed no driving until released to do  so, no heavy lifting over 10 pounds.  He is told to ambulate 3-4 times  per day, progress as tolerated, and continue his breathing exercises.   DIET:  The patient educated  on diet to be low-fat, low-salt well as  diabetic diet.   INCISIONAL CARE:  The patient is told to shower washing his incisions  using soap and water.  He is to contact us if he develops any drainage  or opening from any of his incision sites.   DISCHARGE MEDICATIONS:  1. Norvasc 5 mg daily.  2. Depakote 500 mg b.i.d.  3. Folate 1 mg daily.  4. Nexium 40 mg daily.  5. Lipitor 80 mg at night.  6. Aspirin 325 mg daily.  7. Metformin 1000 mg b.i.d.  8. Oxycodone 5 mg 1-2 tablets q.4-6 h. p.r.n. pain.      Theda Belfast, [place]      Evelene Croon, M.D.  Electronically Signed    KMD/MEDQ  D:  05/27/2008  T:  05/28/2008  Job:  [id number]   cc:   [name], M.D.

## 2010-07-18 NOTE — Telephone Encounter (Signed)
escribe request  

## 2010-07-18 NOTE — Discharge Summary (Signed)
[name], [name]               ACCOUNT NO.:  [id number]   MEDICAL RECORD NO.:  [id number]          PATIENT TYPE:  INP   LOCATION:  4732                         FACILITY:  [facility]   PHYSICIAN:  Herbie Saxon, MDDATE OF BIRTH:  1946/01/04   DATE OF ADMISSION:  08/15/2007  DATE OF DISCHARGE:  08/21/2007                               DISCHARGE SUMMARY   ADDENDUM:   DISCHARGE DIAGNOSES:  1. Right cerebellar cerebrovascular accident.  2. Bradycardia resolved.  3. Coronary artery disease, status post percutaneous transluminal      coronary angioplasty.  4. Diabetes, stable.  5. Hypertension, stable.  6. Tobacco abuse.  7. Medical noncompliance.  8. Hyperlipidemia.  9. Hypo homocystinemia.  10.History of psoriasis.   Now that the patient's bradycardia has improved since he has been taken  off the beta-blocker in the last 48 hours, cardiac is not considering  pacemaker placement at present.  The patient declined inpatient rehab  placement and is also undecided about the stent procedure for his  vertebrobasilar stenosis.  He is declining the vertebrobasilar artery  stenting at this time.   DISCHARGE CONDITION:  Stable.   DIET:  Diet will be low-sodium, hearty-healthy, 1800 calorie ADA, low-  cholesterol.   ACTIVITY:  Increase slowly as tolerated.  The patient still has  dizziness on standing or walking.   FOLLOWUP:  Follow up with primary care physician at [facility] in the next 3 to 5 days.  Followup with Dr. Patty Sermons of  Cardiology in 3 weeks.   MEDICATIONS ON DISCHARGE:  1. Amlodipine 5 mg daily.  2. Meclizine 25 mg q.8 h.  3. K-Dur 20 mEq daily.  4. Depakote 500 mg twice daily.[to be possibly discontinued after dr      Caryl Never reviews patient]  5. Folic acid 1 mg daily.  6. Aspirin 81 mg daily.  7. Plavix 75 mg daily.  8. Pravastatin 40 mg daily.  9. Nexium 40 mg daily.  10.Lisinopril and hydrochlorothiazide 20/12.5 mg daily.  11.Spironolactone 25 mg daily.   PHYSICAL EXAMINATION:  GENERAL:  On examination, a middle-aged man,  obese, not in acute respiratory distress.  VITAL SIGNS:  Temperature is 98, pulse is 73, respiratory rate 18, and  blood pressure 133/71.  HEENT:  Pupils equal, round, reactive to light and accommodation.  LUNGS:  Chest is clinically clear.  HEART:  Sounds S1 and S2, regular, rate, and rhythm.  ABDOMEN:  Benign.  NEUROLOGIC:  No acute ataxia.  There is no dysmetria, no  disdiadochokinesia.  He is alert and oriented in time, place, and  person.  Power is 4+ globally.  Peripheral pulses present.  No pedal  edema.  Cranial nerves II through XII grossly intact.   LABORATORY DATA:  Glucose is 148.  WBC is 10.6, hematocrit 50, platelet  count is 210.  Chemistry shows sodium of 137, potassium 3.7, chloride  104, bicarbonate 21, glucose 130, BUN 10, and creatinine 1.1.   He has been sent home with home PT and OT assistance.      Herbie Saxon, MD  Electronically Signed  MIO/MEDQ  D:  08/21/2007  T:  08/21/2007  Job:  [id number]   cc:   [name], M.D.  [facility]

## 2010-07-18 NOTE — H&P (Signed)
[name], [name]               ACCOUNT NO.:  [id number]   MEDICAL RECORD NO.:  [id number]          PATIENT TYPE:  INP   LOCATION:  1844                         FACILITY:  [facility]   PHYSICIAN:  Altha Harm, MDDATE OF BIRTH:  1946/02/26   DATE OF ADMISSION:  08/15/2007  DATE OF DISCHARGE:                              HISTORY & PHYSICAL   PRIMARY CARE PHYSICIAN:  [facility].   CHIEF COMPLAINT:  Weakness and falling over to the left side.   HISTORY OF PRESENT ILLNESS:  This is a 65 year old gentleman with a  history of hypertension, diabetes, coronary artery disease, status post  CVA in the past who is noncompliant with his medications.  The patient,  according to his wife, was driving today when he complained of tingling  on the right side of his face and weakness in his left upper extremity.  She states that the patient fell over to his left side, but never lost  consciousness.  At that time, she called EMS and the patient was brought  to the emergency room for further evaluation.  According to the  patient's wife, the patient has been in his usual state of health and  has had no complaints prior to the episode today.  The patient has had  no fever or chills.  He has had no nausea, vomiting or diarrhea until  being seen in the emergency room where he has been having some nausea  and vomiting since arrival to the emergency room.  In the emergency  room, the patient was found to have a markedly elevated blood pressure  and was placed on a nitroglycerin drip.  The patient was also found to  be bradycardic, in addition to his blood pressure being excessively  elevated.   PAST MEDICAL HISTORY:  1. Significant for coronary artery disease, status post PTCA.  2. Diabetes, type 2.  3. Hypertension.  4. Questionable CVA in the past.   FAMILY HISTORY:  Noncontributory.   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  He  smokes approximately 1  pack per day times 40 years.  There is no alcohol  or drug use.   CURRENT MEDICATIONS:  According to the patient's wife, the patient is  noncompliant with all of his medications and she does not know all the  medications.  However, she does note the patient is supposed to be  taking Plavix and aspirin.  The wife will get the medications and bring  them back to the hospital within the next 24 hours.   ALLERGIES:  NO KNOWN DRUG ALLERGIES.   REVIEW OF SYSTEMS:  The patient is unable to contribute to review of  systems at this time, except as stated.  He is having dizziness and  nausea at this time.   STUDIES IN EMERGENCY ROOM:  Show the following:  A white blood cell  count of 6.1, hemoglobin of 15.9, hematocrit of 46, platelet count of  228.  Sodium 139, potassium 3.5, chloride 106, bicarb 24, BUN eight,  creatinine 1.13.  CT of the head without contrast  shows no CT evidence  of acute infarction.  Impression:  Interval lacunar type infarct in the  right thalamus compared with January 31, 2006.  Subcortical  periventricular white matter, disease consistent with small vessel  ischemia.   PHYSICAL EXAMINATION:  When I am seeing the patient in the emergency  room, the patient appears in some distress secondary to his symptoms of  dizziness.  He is laying in a supine position in bed.  VITAL SIGNS:  Blood pressure is 198/110, his heart rate is 46 and  respiratory rate is 20, O2 sats were 99% on 2 liters.  His temperature  is 96.7.  HEENT EXAMINATION:  The patient is normocephalic, atraumatic.  The patient has some discrepancy pupillary size with the left pupil  being approximately 6-mm in the right pupil being 4-mm.  His oropharynx  is moist.  No exudates, erythema or lesions are noted.  I cannot  appreciate any facial asymmetry.  NECK EXAMINATION:  Trachea is midline.  No masses, no thyromegaly, no  JVD, no carotid bruit.  RESPIRATORY EXAMINATION:  The patient has normal respiratory  effort,  equal excursion bilaterally.  No wheezing or rhonchi noted.  CARDIOVASCULAR:  He is profoundly bradycardia with heart rate in the  40s.  On the monitor, he is sinus bradycardic with a heart rate of 42.  I cannot appreciate any murmurs, rubs or gallops.  ABDOMINAL EXAMINATION:  Abdomen is obese, soft, nontender, nondistended.  No masses, no hepatosplenomegaly.  The patient does have a annular rash  on the surface of the abdomen.  LYMPH NODE SURVEY:  He has got no cervical, axillary or inguinal  lymphadenopathy noted.  NEUROLOGICAL:  The patient has no asymmetry noted.  He has got no gross  neurological deficits noted and he has no focal neurological deficits  noted.  His cranial nerves II-XII are grossly intact. Hand grip is equal  bilaterally.  There is no ulnar drift noted.  DTRs are hyperactive in  bilateral upper extremities and hypoactive in bilateral lower  extremities.  The patient is able to move his bilateral lower  extremities without difficulty and equally.  That is the extent that can  be obtained on neurologic examination on this patient at this time.  PSYCHIATRIC:  The patient is alert and oriented x3.  However, because of  his nausea and dizziness, he is unable to participate in the examination  any further.   ASSESSMENT/PLAN:  The patient presents with:  Hypertensive urgency/emergency: The patient is currently on a  nitroglycerin drip.  I will switch over to a Cardene drip and right now  give him hydralazine emergently to lower his blood pressure somewhat, as  I do believe that the bradycardia may be related to Cushing's reflex  with the elevated blood pressures.   In terms of the bradycardia, I will moniter pt's heart rate and make  further plans dependent on pt's condition.   I am also going to have this patient get an urgent MRI of the brain as I  suspect a cerebellar infarct although the CT does not show any bleeding.  The patient has what appears to be  some anisocoria developing; by report  from the nurse, this was not present on his initial presentation to the  emergency room.   In terms of diabetes, that will be managed with a sliding scale as the  patient's wife does not know his medications at this time.   The patient be given best practice with GI prophylaxis  and he will be  given SCDs for deep vein thrombosis prophylaxis as the patient with this  blood pressure is at a risk for a bleed and I will not use any  hematologic agents at this time.   The patient is supposed to be on Plavix and aspirin; however, has not  been taking them and we will employ those when the patient is out of his  period of hypertensive urgency.   The patient is requesting that he was seen by his cardiologist, Dr.  Patty Sermons, and I have called and notified the service of Dr. Patty Sermons  of this.  He will see the patient in the morning unless there is an  urgent need for the patient to be seen tonight.  At this time, I do not  see an urgent need for Cardiology to see the patient as the immediate  therapy is to lower the blood pressure to abate the symptoms.  I will  try to maintain the systolic blood pressures to 160 and 180, diastolic  blood pressures no greater than 100 and the patient will be transferred  to ICU as soon as possible.      Altha Harm, MD  Electronically Signed     MAM/MEDQ  D:  08/15/2007  T:  08/15/2007  Job:  [id number]

## 2010-07-18 NOTE — Consult Note (Signed)
[name], [name]               ACCOUNT NO.:  [id number]   MEDICAL RECORD NO.:  [id number]          PATIENT TYPE:  INP   LOCATION:                               FACILITY:  [facility]   PHYSICIAN:  Hillis Range, MD       DATE OF BIRTH:  26-Oct-1945   DATE OF CONSULTATION:  DATE OF DISCHARGE:                                 CONSULTATION   CONSULTING PHYSICIAN:  Evelene Croon, MD   REASON FOR CONSULTATION:  Bradycardia.   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  He was initially admitted on May 15, 2008,  with unstable angina and an elevated troponin.  He underwent left heart  catheterization, which revealed severe three-vessel coronary artery  disease.  He subsequently had a five-vessel CABG performed on May 21, 2008.  He has had a relatively uneventful postoperative course.  He was  extubated on May 21, 2008.  He was noted to have mild bradycardia.  He  did well without any symptoms until May 24, 2008, at 4:57 p.m. when he  developed significant nausea and retching.  He did not have emesis, but  was observed on the monitor to have significant bradycardia with heart  rates in the 30s and several pauses up to 2.9 seconds.  Despite this  bradycardia, the patient denies any symptoms of presyncope or syncope.  He reports chronic and frequent dizziness, though his monitor has  predominantly shown heart rates in the 70s and 80s during these dizzy  episodes.  He denies any recent episodes of syncope.  He did apparently  have altered mental status with his prior strokes, but denies any  unexplained syncope or presyncope in the past.  He is otherwise without  complaint at this time.   PAST MEDICAL HISTORY:  1. Coronary artery  disease status post five-vessel coronary artery      bypass graft.  2. 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  3. 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  4. Diabetes mellitus.  5. Hypertension.  6. Hyperlipidemia.  7. Psoriasis.  8. Renal calculi.  9. Prior bradycardia with beta-blockers.  10.Ongoing tobacco use.  11.Medical noncompliance.   ALLERGIES:  No known drug allergies.   CURRENT MEDICATIONS:  1. Crestor 40 mg daily.  2. Depakote 500 mg b.i.d.  3. Aspirin 325 mg daily.  4. Bisacodyl 10 mg daily.  5. Tylenol 1000 mg q.6 h.  6. Colace 200 mg daily.  7. Protonix 40 mg daily.  8. Lasix 40 mg daily.  9. Potassium chloride 40 mEq daily.  10.Insulin sliding scale.  11.Metformin 1000 mg b.i.d.  12.Metoprolol 12.5 mg b.i.d. (currently on hold).   SOCIAL HISTORY:  The patient lives in [place].  He has a history  of  tobacco use and smokes about 30 cigarettes per day.  He denies alcohol  or drug use.   FAMILY HISTORY:  Notable for coronary artery disease.   REVIEW OF SYSTEMS:  All systems are reviewed and negative except as  outlined in the HPI above.   PHYSICAL EXAMINATION:  VITAL SIGNS:  Blood pressure 136/76, heart rate  82, respirations 18, sats 98% on room air, afebrile.  Telemetry reveals  sinus rhythm with heart rates in the 70s.  GENERAL:  The patient is an obese male in no acute distress.  He is  alert and oriented x3.  HEENT:  Normocephalic, atraumatic.  Sclerae clear.  Conjunctivae pink.  Oropharynx clear.  NECK:  Supple.  No thyromegaly, JVD, or bruits.  LUNGS:  Clear to auscultation bilaterally with a few bibasilar rales.  HEART:  Regular rate and rhythm.  There is a mild friction rub.  No  murmurs or gallops.  GI:  Soft, nontender, nondistended.  Positive bowel sounds.  EXTREMITIES:  No clubbing or cyanosis.  The patient does  have 1+ edema  bilaterally.  SKIN:  The patient has diffuse psoriasis.  His surgical scar appears to  be healing nicely.  MUSCULOSKELETAL:  No deformity or atrophy.  NEUROLOGIC:  Cranial nerves II through XII are intact.  Strength and  sensation are intact.  PSYCH:  Flat affect, depressed mood.   EKG reveals sinus rhythm at 84 beats per minute with nonspecific ST/T-  wave changes.   LABORATORY DATA:  TSH normal.  Creatinine 1.2, potassium 3.9, sodium  140, valproic acid 54, hematocrit 32, platelets 140.  White blood cell  count 11.   IMPRESSION:  [name] is a pleasant 65 year old gentleman with  multiple comorbidities as outlined above.  He was now recovering status  post recent coronary artery bypass grafting.  He has had bradycardia  following surgery, which was most pronounced yesterday evening during an  episode of nausea with significant retching.  I suspect that this  represents a vagal episode and is a reversible cause of bradycardia.  The patient has also been treated previously with beta-blockers with  bradycardias observed.  He does not appear to have significant symptoms  with his bradycardia and denies any recent presyncope or syncope.  I  therefore think that the most prudent choice of action would be to  discontinue his beta-blocker and to observe him on telemetry for another  24 hours.  I have decreased his pacemaker backup rate to 30 beats per  minute, which will allow [place] to assess his underlying conduction.  The  patient  were to have any pauses of 4 seconds, presyncope, syncope, or  symptomatic bradycardia outside of the setting of a vagal event, then  pacemaker might be warranted at that time.  However, should he do well  without any further episodes of bradycardia, then I do not think that  further electrophysiologic evaluation will be necessary.      Hillis Range, MD  Electronically Signed     JA/MEDQ  D:  05/25/2008  T:  05/26/2008  Job:   [id number]   cc:   Evelene Croon, M.D.  [name], M.D.

## 2010-07-18 NOTE — Op Note (Signed)
[name], [name]               ACCOUNT NO.:  [id number]   MEDICAL RECORD NO.:  [id number]          PATIENT TYPE:  INP   LOCATION:  2022                         FACILITY:  [facility]   PHYSICIAN:  Doylene Canning. Ladona Ridgel, MD    DATE OF BIRTH:  1945/03/21   DATE OF PROCEDURE:  05/27/2008  DATE OF DISCHARGE:                               OPERATIVE REPORT   PROCEDURE PERFORMED:  Implantation of a dual-chamber pacemaker.   INDICATION:  Symptomatic bradycardia after coronary artery bypass  grafting.   INTRODUCTION:  The patient is a 65 year old man with known coronary  artery disease, status post bypass surgery.  Postoperatively, he has had  persistence of intermittent episodes of symptomatic bradycardia with  pauses of over 2 seconds and sustained heart rates in the 20-30 range.  These are symptomatic and he is now referred for permanent pacemaker  insertion.   PROCEDURE:  After informed consent was obtained, the patient was taken  to the diagnostic EP lab in the fasting state.  After usual preparation  and draping, intravenous fentanyl and midazolam was given for sedation.  A 30 mL of lidocaine was infiltrated in the left infraclavicular region.  Initial attempts to cannulate the left subclavian vein were unsuccessful  and 10 mL of IV contrast was injected into the left upper extremity  venous system demonstrating that the left subclavian vein was in fact  patent, though it was somewhat superiorly oriented.  The vein was then  punctured x2 and the Medtronic model 5076, 58-cm active fixation pacing  lead, serial #[id number] was advanced into the right ventricle.  The  Medtronic model [id number], 52-cm active fixation pacing lead, serial  #[id number] was advanced into the right atrium.  Mapping was  successfully carried out in the right ventricle.  At the final site, the  R-waves were 11, the impedance was 780 ohms and threshold 0.6 V of 0.5  msec.  A prominent injury current was noted with active  fixation of the  lead.  With the ventricular lead in satisfactory position, our attention  was then turned to placement of the atrial lead, which was placed in the  anterolateral portion of the right atrium.  In this location, P-waves  measured 3 mV and the patient's impedance was 544 ohms once the lead was  actively fixed, threshold 0.6 V at 0.5 msec, 10 V pacing did not  stimulate the diaphragm and again there was a large injury current noted  with active fixation of the lead.  With both the atrial and ventricular  leads in satisfactory position, they were secured to the subpectoralis  fascia with a figure-of-eight silk suture.  The sewing sleeve was also  secured with silk suture.  Electrocautery was then utilized to make  subcutaneous pocket.  Kanamycin irrigation was utilized to irrigate the  pocket and electrocautery was utilized to assure hemostasis.  The  Medtronic Adapta L dual-chamber pacemaker, serial [id number] was  connected to the atrial and ventricular leads and placed back into the  subcutaneous pocket.  Generator secured with silk suture.  Additional  kanamycin was then utilized to irrigate  the pocket and the incision was  closed with 2-0 Vicryl and 3-0 Vicryl.  Benzoin was painted on the skin.  Steri-Strips were applied and a pressure dressing was placed.  The  patient was returned to his room in satisfactory condition.   COMPLICATIONS:  There were no immediate procedure complications.   RESULTS:  This demonstrate successful implantation of a Medtronic dual-  chamber pacemaker and the patient with symptomatic bradycardia.       Doylene Canning. Ladona Ridgel, MD  Electronically Signed     GWT/MEDQ  D:  05/27/2008  T:  05/27/2008  Job:  [id number]   cc:   [name], M.D.

## 2010-07-18 NOTE — Cardiovascular Report (Signed)
NAMEESAIAS, [name]               ACCOUNT NO.:  [id number]   MEDICAL RECORD NO.:  [id number]          PATIENT TYPE:  OIB   LOCATION:  1965                         FACILITY:  [facility]   PHYSICIAN:  Vesta Mixer, M.D. DATE OF BIRTH:  March 04, 1946   DATE OF PROCEDURE:  09/18/2006  DATE OF DISCHARGE:                            CARDIAC CATHETERIZATION   HISTORY:  [name] is a 65 year old gentleman with a known history of  coronary artery disease.  He is status post PTCA and stenting of his  left anterior descending artery many years ago.  He returns with  symptoms of angina at rest and with exertion.  He is referred for heart  catheterization for further evaluation.   The right femoral artery was easily cannulated using the modified  Seldinger technique.   HEMODYNAMICS:  LV pressure is 141/3 with an aortic pressure of 148/77.   ANGIOGRAPHY:  Left main - the left main has minor luminal  irregularities.   Left anterior descending artery has mild to moderate diffuse disease.  There is a 30-40% proximal stenosis.  There is a stent in the mid-LAD  which has mild in-stent restenosis of about 20-30%.  The first diagonal  artery is very small.  This vessel has a 90% stenosis, and this vessel  is so small that it is not a candidate for angioplasty.   The second diagonal artery is a moderate to large vessel.  There is a  50% stenosis at its origin.   The left circumflex artery is a moderate-sized branch.  There is a  proximal 30-40% stenosis.  There is a mid stenosis of about 50-60% at  the division of the OM and the obtuse marginal branches.   The first obtuse marginal artery is fairly small.  The second obtuse  marginal artery is very tiny and has a severe proximal stenosis.  The  third obtuse marginal artery/posterolateral branch is a fairly large  vessel.  There is an 80% stenosis in its mid section.  This could be the  stenosis that is responsible for his angina pain.  The right  coronary  artery is occluded proximally.  There are extensive collaterals that  fill the right coronary artery from right-to-right collaterals as well  as left-to-right collaterals.   The left ventriculogram was performed in the 30 RAO position.  It  reveals overall well-preserved left ventricular systolic function.  There appears to be some hypokinesis of the posterior wall.  The  ejection fraction is approximately 60%.   COMPLICATIONS:  None.   CONCLUSIONS:  Moderate coronary artery disease involving all 3 coronary  arteries.  He has severe disease involving the right coronary artery and  moderate to severe disease of the left circumflex artery.  We can  consider PTCA and stenting of the obtuse marginal artery.   The vessel is moderately calcified so that there is some increased risk.  His LAD remains fairly patent, so I do not think that coronary artery  bypass grafting is a good option at this point.  We will also try to  increase his medical therapy.  We  will also try encourage him to stop  smoking.           ______________________________  Vesta Mixer, M.D.     PJN/MEDQ  D:  09/18/2006  T:  09/19/2006  Job:  [id number]   cc:   [name], M.D.

## 2010-07-18 NOTE — Consult Note (Signed)
NAMECERRONE, DEBOLD               ACCOUNT NO.:  [id number]   MEDICAL RECORD NO.:  [id number]          PATIENT TYPE:  INP   LOCATION:  4732                         FACILITY:  [facility]   PHYSICIAN:  Gustavus Messing. Orlin Hilding, M.D.DATE OF BIRTH:  Sep 14, 1945   DATE OF CONSULTATION:  08/17/2007  DATE OF DISCHARGE:                                 CONSULTATION   CHIEF COMPLAINT:  Dizziness, nausea, and hiccups.   HISTORY OF PRESENT ILLNESS:  Mr. Steele is a 65 year old white male  with history of hypertension, diabetes, coronary artery disease, and  previous stroke and TIAs as well as medical noncompliance.  According to  the patient, he was driving down [facility] Friday when he complained of  feeling dizzy, nauseated, tingling on the right side of his face and  left side of body, was apparently able to pull over, and slumped to the  left but without loss of consciousness.  It is unclear that he actually  lost consciousness to me or what is listed in the history.  His wife  called EMS.  When he arrived to the emergency room, he was found to have  significant hypertension with blood pressure of 211/116 and bradycardic  in the 50s.  There is a history of previous TIAs with at least 3  episodes of right body weakness and previous TIA more remotely affecting  the left side.  Once again, one note says that he actually passed out  while driving, another noted says that he did not lose consciousness.  When he was seen in the emergency room, he did not have any focal  abnormalities but has had problems with nausea and vomiting and  dizziness.  A CT was negative for any acute abnormalities although an  interval lacunar infarction was noted in the right thalamus as compared  to a study from 2007.  However, he has continued to have nausea,  vomiting, and hiccups.  His blood pressure is being treated as this is  bradycardia, but an MRI scan of the brain was obtained which shows  several small acute strokes in  the right posterior inferior cerebellum  which corresponds with the pica territory and MRA also shows new  occlusion or stenosis of the right vertebral artery and right pica.  This was diseased before but this appears to have progressed.  The  patient denies any headache currently, although he had had headaches  intermittently in the past.   REVIEW OF SYSTEMS:  Out of the 12-system review, the patient denies  pain.  He has had angina in the past.  He has had headaches as noted.  Obviously, does have the dizziness as already described.  He has some  psoriasis and that is the extent of his review of systems.   PAST MEDICAL HISTORY:  Significant for multiple TIAs in the past at the  hospitalization in 2007 for problems related to that.  He has  hypertension, peripheral vascular disease, angina, he has had PTCA with  stent, coronary artery disease, and COPD.  Last cardiac catheterization  was September 18, 2006.  He does have chronic migraine,  stroke, psoriasis,  and diabetes.  There are concerns about noncompliance with medication.   MEDICATIONS:  He was supposed to be taking on admission were  spironolactone, lisinopril, hydrochlorothiazide, Nexium, pravastatin,  metoprolol, Plavix, and aspirin.  Once again, it is uncertain how  compliant he has been.  His current medications are Norvasc, aspirin,  Plavix, Presalin, hydrochlorothiazide, NovoLog insulin, lisinopril,  meclizine, nicardipine, nitroglycerin he was on a drip because of his  blood pressure, ondansetron, Protonix, Phenergan, Zocor, Aldactone, and  Thorazine for the hiccups as well as additional Zofran.  He was also  been given some potassium supplementation.   ALLERGIES:  He had abdominal pain with METFORMIN in the past.   SOCIAL HISTORY:  He is married.  He does smoke a pack cigarettes a day  and has never used alcohol or recreational drugs according to his  history.   FAMILY HISTORY:  Negative for strokes according to the  patient.   OBJECTIVE:  VITAL SIGNS:  Temperature is 98.5, pulse 57, blood pressure  170/77, respirations 16, CBG is 126.  HEAD:  Normocephalic, atraumatic.  NECK:  Supple without bruits.  HEART:  Regular rate and rhythm.  LUNGS:  Clear except for a few quite crackles in the right posterior  lung base.  NEUROLOGIC:  He is awake but drowsy which is probably sedation from  medication.  He answers 2/2 questions correctly, obeys 2/2 commands  correctly.  He has normal gaze, no visual field loss.  He has a slight  right nasolabial fold flattening.  No drift in either upper or lower  extremities with pretty good grip strength.  No ataxia on finger-to-nose  or heel-to-shin on either side.  Decreased sensation in the left lower  extremity.  No aphasia.  Moderate dysarthria.  No neglect.  He scores 4  on NIH stroke scale.  In addition, deep tendon reflexes were absent.  He  had an upgoing toe on the left, neutral on the right.  Pupils were small  but equal.   MRI shows old small vessel strokes including the pons and thalamus as  well as multiple small acute strokes in the right posterior inferior  cerebellum and stenosis or occlusion of the right vertebral and pica.   LABORATORY DATA:  Fairly unremarkable.  Hemoglobin A1c is 6.4, lipids of  303, LDL 231, HDL 35.   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  The right vertebral artery and PICA  occlusion is the immediate cause of his infarction, however, the  etiology of the occlusion is unclear (i.e.,whether this is secondary to  hypertension or an embolic process).  I think an embolic event is  probably less likely.  He has had a series of transient ischemic attacks  over the years.   RECOMMENDATIONS:  He needs ST evaluation for speech and swallow.  He  needs a bedside swallow evaluation more immediately.  ST will need to  see him any way for language and speech.  He needs OT and PT.   We need  to check a homocystine level.  I agree with carotid Doppler and echo.  I  will give him a trial of Depakote for the hiccups, hopefully this will  allow [place] to cut down on the Thorazine and reduce the drowsiness.  Stroke  team will follow.      Catherine A. Orlin Hilding, M.D.  Electronically Signed     CAW/MEDQ  D:  08/17/2007  T:  08/17/2007  Job:  [id number] 

## 2010-07-18 NOTE — Consult Note (Signed)
NAMEJEET, SHOUGH               ACCOUNT NO.:  [id number]   MEDICAL RECORD NO.:  [id number]          PATIENT TYPE:  INP   LOCATION:  2926                         FACILITY:  [facility]   PHYSICIAN:  Evelene Croon, M.D.     DATE OF BIRTH:  [birth year]   DATE OF CONSULTATION:  05/17/2008  DATE OF DISCHARGE:                                 CONSULTATION   REASON FOR CONSULTATION:  Severe 3-vessel coronary artery disease status  post  non-ST segment elevation MI.   CLINICAL HISTORY:  I was asked by Dr. Reyes Ivan to evaluate Mr. Silveria for  consideration of coronary artery bypass graft surgery.  He is a 65-year-  old gentleman with history of heavy smoking, diabetes, hypertension,  cerebrovascular disease, and medical noncompliance as well as history of  multivessel coronary artery disease status post myocardial infarction  and stenting in the past.  He was admitted on May 15, 2008 after  presenting with intermittent 8/10 chest pain off and on since the prior  morning.  He ruled in for non-ST segment elevation MI with initial  troponin of 0.2 increasing to 0.42, initial CPK of 46 increasing to 59  with MB of 2.8 increasing to 4.2.  He underwent cardiac catheterization  today which showed mild distal left main tapering.  The LAD had proximal  70% stenosis.  There is a patent mid LAD stenosis with less than 20% in-  stent restenosis.  There is some moderate distal irregularity in the LAD  with 50% stenosis distally.  The diagonal branch was a moderate-sized  vessel with mild luminal irregularities.  Left circumflex was a  nondominant vessel with proximal 70% stenosis.  There were 3 moderate-to-  large size marginal branches.  The second marginal had ostial narrowing.  The third marginal had 95% proximal in-stent restenosis.  The fourth  marginal had mild irregularities.  The right coronary was a dominant  vessel with proximal occlusion.  The distal posterior descending and  posterior lot of  branches filled by collaterals from the right and left.  Ejection fraction is 40% with moderate inferior hypokinesis and  diastolic pressure of about 30 mmHg.   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  He has made a  fairly good recovery from that stroke and is ambulatory, but has chronic  hiccups related to the stroke.  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  He has a history  of coronary artery disease status post stenting of his LAD and left  circumflex 3 or 4 years ago.  He has a history of psoriasis.  He has a  history of kidney stones and has had surgery for that.   REVIEW OF SYSTEMS:  GENERAL:  He has had no fever or chills.  He has had  no recent weight changes.  He does report fatigue but is not very  active.  EYES:  Negative.  ENT:  Negative.  ENDOCRINE:  He has adult-  onset diabetes.  Denies hypothyroidism.  CARDIOVASCULAR:  As above.  He  denies PND or orthopnea.  He has had exertional dyspnea.  Denies  palpitations or peripheral edema.  RESPIRATORY:  Denies cough or sputum  production.  He does have chronic hiccups since his stroke.  He said  these occurred for about 3 days at times at least once per week.  GI:  He has had some nausea and vomiting intolerance which he relates to his  hiccups.  He has a history of gastroesophageal reflux disease and has  been treated with proton pump inhibitor.  Denies melena or bright red  blood per rectum.  NEUROLOGICAL:  As noted above.  He is status post  stroke in June 2009.  Denies any focal weakness or numbness.  He said he  did have some burning pain in his left arm.  Denies any headache or  syncope.  MUSCULOSKELETAL:  Denies arthralgias or myalgias.  PSYCHIATRIC:  Negative.  HEMATOLOGICAL:  Negative.   ALLERGIES:  None.   SOCIAL  HISTORY:  He lives alone.  He smokes daily.  He is married, but  does not live with his wife.  He has been out of work since his stroke.  He smokes about 30 cigarettes per day.  He denies alcohol use.   FAMILY HISTORY:  Strongly positive for cardiac disease in his mother,  father, and multiple siblings.   MEDICATIONS:  Prior to his admission, he has been on Plavix in the past  but only intermittently.  He had been on aspirin but stopped that really  ran out.   OTHER MEDICATIONS:  1. Amlodipine 5 mg daily.  2. Meclizine 25 mg q.8 h.  3. Depakote 500 mg b.i.d.  4. Potassium 20 mEq daily.  5. Folate 1 mg daily.  6. Aspirin 81 mg daily.  7. Plavix 75 mg daily which he takes intermittently.  8. Pravachol 40 mg daily.  9. Nexium 40 mg daily.  10.Lisinopril/HCTZ 20/12.5 daily.  11.Spironolactone 25 mg daily.   PHYSICAL EXAMINATION:  VITAL SIGNS:  Blood pressure 143/79, pulse 69 and  regular.  Respiratory rate is 16 and unlabored.  GENERAL:  He is a disheveled-appearing white male in no distress.  HEENT:  Normocephalic and atraumatic.  Pupils are equal and reactive to  light.  Extraocular muscles are intact.  His throat is clear.  NECK:  Normal carotid pulses bilaterally.  There are no bruits.  There  is no JVD.  There is no adenopathy or thyromegaly.  CARDIAC:  Regular rate and rhythm with normal S1 and S2.  There is no  murmur, rub, or gallop.  LUNGS:  Clear.  ABDOMINAL:  Active bowel sounds.  His abdomen is soft, mildly obese, and  nontender.  There are no palpable masses or organomegaly.  EXTREMITIES:  No peripheral edema.  Pedal pulses are strongly palpable  bilaterally.  SKIN:  Warm and dry.  He does have psoriasis involving back and  extremities.  NEUROLOGIC:  He is awake, alert, and oriented x3.  Motor strength is 5/5  in all extremities.  Sensation is grossly intact.   Electrocardiogram shows normal sinus rhythm with minimal voltage for  LVH.  No repolarization  abnormality.   IMPRESSION:  Mr. Vanvranken has severe three-vessel coronary artery disease  presenting with non-ST segment elevation myocardial infarction.  I  agreed coronary artery bypass graft surgery is the probably best long-  term treatment for him following by maximum cardiac risk  factor  reduction.  I do not think stenting will be a good option for him, given  the diffuse nature of his coronary artery disease with right coronary  occlusion and the fact that he has been noncompliant with medications.  He did receive 600 mg of Plavix on admission and therefore, we will  await until Friday of this week to do surgery.  I discussed the  operative procedure with him including alternatives, benefits, and risks  including but not limited to bleeding, blood transfusion, infection,  stroke, myocardial infarction,  graft failure and death.  I also discussed the importance of maximum  cardiac risk factor reduction including complete smoking cessation.  He  understands and agrees to proceed.  We will plan to obtain preoperative  carotid Dopplers this week.      Evelene Croon, M.D.  Electronically Signed     BB/MEDQ  D:  05/17/2008  T:  05/18/2008  Job:  [id number]   cc:   [name], M.D.

## 2010-07-18 NOTE — Cardiovascular Report (Signed)
NAMEZAIR, BORAWSKI               ACCOUNT NO.:  [id number]   MEDICAL RECORD NO.:  [id number]          PATIENT TYPE:  INP   LOCATION:  2926                         FACILITY:  [facility]   PHYSICIAN:  Elmore Guise., M.D.DATE OF BIRTH:  Sep 12, 1945   DATE OF PROCEDURE:  05/17/2008  DATE OF DISCHARGE:                            CARDIAC CATHETERIZATION   INDICATIONS FOR PROCEDURE:  Unstable angina, mild troponin elevation.   HISTORY OF PRESENT ILLNESS:  Mr. Kotarski is a 65 year old male with past  medical history of coronary artery disease, stroke, diabetes mellitus,  hypertension, dyslipidemia, and medical noncompliance who presented with  a 3-day history of unstable angina symptoms.  He had mild troponin  elevation with peak troponin of 0.57.  He is now referred for cardiac  catheterization.   DESCRIPTION OF PROCEDURE:  The patient was brought to the cardiac cath  lab.  After appropriate informed consent, he was prepped and draped in  sterile fashion.  Approximately 10 mL of 1% lidocaine was used for local  anesthesia.  A 5-French sheath was placed in the right femoral artery.  Coronary angiography, LV angiography were then performed.  The patient  tolerated the procedure well and was transferred from the cardiac cath  lab in stable condition.  Due to hypertension, the patient was given 10  mg of IV labetalol during the procedure.   FINDINGS:  1. Left main:  Mild distal tapering noted.  2. LAD:  Proximal 70% stenosis with patent mid stent with less than      20% in-stent restenosis, mild-to-moderate distal luminal      irregularities noted.  3. D1:  Small-to-moderate size vessel with mild-to-moderate luminal      irregularities.  4. LCX:  Nondominant with proximal 70% stenosis, has a very high      branching obtuse marginal which is a small vessel with mild-to-      moderate luminal irregularities.  5. OM-2 is also a small vessel with moderate ostial/proximal disease.  6. OM-3  has a proximal/ostial stent with 95% in-stent restenosis.      There is a mid 40% stenosis noted with distal luminal      irregularities.  7. OM-4:  Mild-to-moderate luminal irregularities.  8. RCA:  Dominant with proximal occlusion.  Distal vessel fills      faintly via left-to-right collaterals.  There is moderate right-to-      right collaterals from proximal RCA noted filling the PDA and PLV      with retrograde filling to the distal right coronary.  9. LV:  EF 40% with moderate inferior hypokinesis.  LVEDP is 30 mmHg.   IMPRESSION:  1. Obstructive native vessel disease.  2. Mild left ventricular dysfunction, ejection fraction 40%.  3. Elevated left ventricular end-diastolic pressures.   PLAN:  At this time, I would recommend aggressive medical therapy.  We  will restart his heparin if his symptoms return.  He will need a CVTS  consult for surgical revascularization.  With his intermittent use of  Plavix, he will need to wait approximately 3 days to limit his  postoperative bleeding  potential.      Elmore Guise., M.D.  Electronically Signed     TWK/MEDQ  D:  05/17/2008  T:  05/17/2008  Job:  [id number]

## 2010-07-18 NOTE — H&P (Signed)
[name], [name]               ACCOUNT NO.:  [id number]   MEDICAL RECORD NO.:  [id number]          PATIENT TYPE:  INP   LOCATION:  2926                         FACILITY:  [facility]   PHYSICIAN:  Christell Faith, MD   DATE OF BIRTH:  1945/07/24   DATE OF ADMISSION:  05/15/2008  DATE OF DISCHARGE:                              HISTORY & PHYSICAL   PRIMARY CARE PHYSICIAN:  [facility].   CHIEF COMPLAINT:  Chest pain.   HISTORY OF PRESENT ILLNESS:  This is a 65 year old white man with a  history of multivessel coronary artery disease.  He is an ongoing smoker  who is noncompliant with his medicines due to chronic hiccups  following stroke in the past.  He has been having intermittent 8/10  chest pressure since yesterday morning.  He is having pain at rest  approximately every 2 hours.  He has taken greater than 25 sublingual  nitroglycerin over the past 2 days.  The patient has been out of work  for the past 10 months, no longer lives with his wife, and is not sure  what medicines he may or may not be taking at the current time.  He does  note that he recently ran out of aspirin and that he keeps forgetting  to buy more.   PAST MEDICAL HISTORY:  1. Diabetes mellitus type 2.  2. Hypertension.  3. History of TIA in 2007 and cerebellar stroke in June 2009.  4. History of coronary artery disease.  Last catheterization was July      2008, it showed 20%-30% in-stent restenosis in the mid LAD, 90%      lesion of a small first diagonal vessel, 60% mid left circumflex,      80% large obtuse marginal, and occluded right coronary with left-to-      right collaterals.  5. 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  6. 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  7. Psoriasis.   SOCIAL HISTORY:  Lives in [place], alone.  He formerly worked in a  quarry.  He has been out of work for 10 months.  He smokes greater than  a pack a day of  cigarettes, does not use alcohol, and drinks a lot of  [facility].   FAMILY HISTORY:  Mother, father, and multiple siblings all have coronary  artery disease.   ALLERGIES:  None.   MEDICINES:  He does not know his medicine list.  He states that he  recently stopped taking aspirin because he ran out.  He intermittently  takes Plavix.   REVIEW OF SYSTEMS:  Positive for angina in the past 2 days, previous to  that no angina.  He saw Dr. Patty Sermons in clinic last week and was  feeling fairly well.  He has chronic dyspnea and this is probably due to  COPD.  He has chronic hiccups following a stroke.  He has some nausea  and GERD, which is controlled with a proton pump inhibitor.  Otherwise,  the balance of 14 systems are reviewed and are negative.  PHYSICAL EXAMINATION:  VITAL SIGNS:  Temperature 97.4, pulse 76,  respiratory rate 14.  Initial blood pressure 162/87, on my exam 117/70.  Saturation 99% on room air.  GENERAL:  This is a blunted white man, in no acute distress.  HEENT:  Pupils are round and reactive.  Sclerae clear.  No oral lesions.  Mucous membranes are moist.  NECK:  Supple.  Neck veins are flat.  No carotid bruits.  No  thyromegaly.  LUNGS:  Prominent expiratory phase, but no overt wheezing.  Otherwise  clear.  CARDIAC:  Normal rate, regular rhythm.  No murmur.  ABDOMEN: Soft, nontender, nondistended.  No bruits.  EXTREMITIES:  No edema.  Dorsalis pedis and radial pulses 2+  bilaterally.  SKIN:  Psoriasis plaquing on the back and other scattered areas.  NEUROLOGIC:  Not fully tested, but the patient is awake, alert, and  oriented x3 with no acute stroke deficits.   DIAGNOSTIC TESTS:  Chest x-ray shows no acute disease.  EKG is abnormal,  sinus rhythm, rate 79 beats per minute, with inferolateral ST depression  and T-wave inversions that were not present on previous EKG.   LABORATORY DATA:  White blood cell 5.1, hemoglobin 14.8, platelets 206.  Sodium 134,  potassium 3.7, BUN 8, creatinine 1.2, glucose 223.  CK 46,  CK-MB 2.8, troponin 0.2.   IMPRESSION:  This is a 65 year old white man with multivessel coronary  disease, now with high-risk unstable angina.  He has an abnormal EKG and  borderline troponin.   PLAN:  1. Admit to the step-down unit to a monitored bed.  Cycle cardiac      enzymes and EKGs.  2. We will aggressively medically manage with unfractionated heparin,      aspirin, and we will reload with 600 mg of Plavix and then start 75      mg a day.  If his troponins rise or he develops further chest pain,      we will initiate glycoprotein 2B3A inhibitor.  3. He will be kept n.p.o. after midnight "Sunday night, for possible      cath Monday morning; however, the patient does have a long history      of medical noncompliance and stenting may carry the risk of Plavix      noncompliance in the future.  4. He has a history of bradycardia, on beta-blockers; however, this      was also at the time of an acute stroke.  We will cautiously      initiate low-dose Lopressor and hold for heart rate less than 65.  5. We will check a fasting lipid panel and initiate 80 mg Lipitor      daily.  6. He will receive tobacco abuse counseling.  7. He is unsure if he takes any oral pills for diabetes, clearly he is      hyperglycemic and will be managed with insulin in the hospital.  8. The patient's significant other is going to work on obtaining an      accurate medicine list.  In addition, we will obtain a copy of Dr.      Brackbill's note from last week.  9. He has a history of significant hypertension, but he is currently      normotensive.  If he has persistent blood pressures above 140 or      15" 0 systolic, we will start on nitroglycerin drip.      Christell Faith, MD  Electronically Signed  NDL/MEDQ  D:  05/15/2008  T:  05/16/2008  Job:  [id number]

## 2010-07-18 NOTE — Consult Note (Signed)
[name], [name]               ACCOUNT NO.:  [id number]   MEDICAL RECORD NO.:  [id number]          PATIENT TYPE:  INP   LOCATION:  2913                         FACILITY:  [facility]   PHYSICIAN:  Colleen Can. Deborah Chalk, M.D.DATE OF BIRTH:  1945/11/21   DATE OF CONSULTATION:  08/16/2007  DATE OF DISCHARGE:                                 CONSULTATION   Thank you much for asking [place] to see [name].  He presented with  marked hypertension, bradycardia, and findings consistent with  cerebellar CVA with significant dizziness and nausea.   He is 61.  He has been noncompliant and he has a long history of  hypertension, diabetes, 3-vessel coronary artery disease, and CVA.  He  presents to the hospital with tingling of the right side of his face  with weakness in the left upper extremity while driving.  There was no  loss of consciousness.  He is brought to emergency room where he had  nausea and vomiting.  Blood pressure markedly elevated 198/110.  His  heart rate is 46.   PAST MEDICAL HISTORY:  History of coronary artery disease with remote  PCI to the LAD.  His last catheterization was in July 2008 when he had 3-  vessel disease.  He has severe disease in his right coronary artery,  moderate-to-severe disease in left circumflex.  His LAD was free of  significant obstruction.  He has had a history of diabetes,  hypertension, questionable prior CVA, history of COPD, ongoing tobacco  abuse, and noncompliance with his medicines.   ALLERGIES:  None.   CURRENT MEDICATIONS:  1. IV Protonix.  2. Zocor 40 mg a day.  3. IV nitroglycerin.  4. IV Cardene.   FAMILY HISTORY:  Strongly positive for coronary disease in his father  with multiple uncles.  His mother had severe COPD.   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.  He has a history  of ongoing tobacco abuse.  No alcohol.   REVIEW OF SYSTEMS:  He is noncompliant with medicines and a large part  related to cough.   PHYSICAL  EXAMINATION:  GENERAL:  He is a muscular well-developed male.  His eyes are closed because of symptoms of vertigo and dizziness.  VITAL SIGNS:  Blood pressure is 173/88.  Heart rate is in the 50s.  SKIN:  Warm and dry.  Color is suntanned.  LUNGS:  Reasonably clear.  HEART:  Regular rate and rhythm without murmur.  ABDOMEN:  Soft.  EXTREMITIES:  Without edema.  NEUROLOGIC:  Deferred.   LABORATORY DATA:  Hematocrit is 46, white count 6000, and platelets  228,000.  Potassium 3.1, BUN 7, creatinine 0.9, and glucose 136.  Troponin was 0.17 and decreased to 0.11.  CKs are all negative x2.  Total cholesterol was 303 with LDL cholesterol of 231, HDL 35,  triglycerides 184.  Hemoglobin A1c 6.4.  TSH is 0.567.  CT of the head  showed no acute infarction in a lacunar-type infarction of the right  thalamus since November 2007.  EKG shows sinus bradycardia with T-wave  changes and LVH.  OVERALL IMPRESSION:  1. Hypertension, hypertensive crisis/bradycardia/cerebrovascular      accident.  2. Longstanding history of noncompliance.  3. Severe 3-vessel disease with questionable significant rise in      troponin with negative cardiac enzymes.  4. Ongoing tobacco abuse.  5. Acute cerebellar infarct associated with dizziness.   PLAN:  We will gradually control blood pressure.  I think IV  nitroglycerin was a good idea in addition IV Cardene.  With pauses, we  will have to limit beta-blocker.  We will follow with you.      Colleen Can. Deborah Chalk, M.D.  Electronically Signed     SNT/MEDQ  D:  08/16/2007  T:  08/17/2007  Job:  [id number]   cc:   [name], M.D.

## 2010-07-18 NOTE — Op Note (Signed)
[name], [name]               ACCOUNT NO.:  [id number]   MEDICAL RECORD NO.:  [id number]          PATIENT TYPE:  INP   LOCATION:  2310                         FACILITY:  [facility]   PHYSICIAN:  Evelene Croon, M.D.     DATE OF BIRTH:  1945-09-24   DATE OF PROCEDURE:  05/21/2008  DATE OF DISCHARGE:                               OPERATIVE REPORT   PREOPERATIVE DIAGNOSIS:  Severe 3-vessel coronary artery disease.   POSTOPERATIVE DIAGNOSIS:  Severe 3-vessel coronary artery disease.   OPERATIVE PROCEDURE:  Median sternotomy, extracorporeal circulation,  coronary artery bypass graft surgery x5 using a left internal mammary  artery graft to the left anterior descending coronary artery, with a  saphenous vein graft to the second diagonal branch to the left anterior  descending, a saphenous vein graft to the posterior descending branch of  the right coronary artery, and a sequential saphenous vein graft to the  first and third marginal branch of the left circumflex coronary artery.  Endoscopic vein harvesting from the right leg.   ATTENDING SURGEON:  Evelene Croon, MD   ASSISTANT:  Doree Fudge, PA-C   ANESTHESIA:  General endotracheal.   CLINICAL HISTORY:  This patient is a 65 year old noncompliant, diabetic,  hypertensive, heavy smoker with a known history of coronary disease,  status post stenting in the past.  He also suffered a stroke in June  2006 and had a history of TIA in 2007.  He now presents with  intermittent substernal chest pain and non-ST-segment elevation MI.  Cardiac catheterization by Dr. Reyes Ivan shows severe 3-vessel coronary  artery disease.  The left main had mild distal tapering.  The LAD had a  proximal 70% stenosis.  There is a patent mid LAD stent with less than  20% in-stent restenosis.  The first diagonal branch was a moderate-sized  vessel with mild-to-moderate luminal irregularities.  The left  circumflex was a nondominant vessel with proximal 70%  stenosis.  There  was a small-to-moderate size first marginal branch and a very small  second marginal.  The third marginal was a largest vessel with proximal  in-stent restenosis of 95%.  The fourth marginal was a smaller vessel  with mild irregularities.  The right coronary artery was dominant vessel  and was occluded proximally with filling of distal vessel by collaterals  in the left and right.  Ejection fraction was 40%.  After review of the  catheterization examination with the patient, it was felt that coronary  artery bypass graft surgery was the best treatment to prevent further  ischemia and infarction.  I discussed the operative procedure with the  patient including alternatives, benefits, and risks including but not  limited to bleeding, blood transfusion, infection, stroke, myocardial  infarction, graft failure, and death.  Also discussed the importance of  maximum cardiac risk factor reduction.  He understood and agreed to  proceed.   OPERATIVE PROCEDURE:  The patient was taken to the operating room and  placed on the table in supine position.  After induction of general  endotracheal anesthesia, a Foley catheter was placed in the bladder  using  sterile technique.  Then the chest, abdomen, and both lower  extremities were prepped and draped in the usual sterile manner.  The  chest was opened through a median sternotomy incision and pericardium  opened in midline.  Examination of the heart showed good ventricular  contractility.  The ascending aorta was moderately diseased with  calcific plaque present distally just proximal to takeoff of the  innominate artery.  There was also a calcified plaque in the aortic  root.  The mid ascending aorta was mildly thickened, but had no  significant plaque.   Then, the left internal mammary artery was harvested from the chest wall  as a pedicle graft.  This was a medium-caliber vessel with excellent  blood flow through it.  At the same  time, a segment of greater saphenous  vein was harvested from the left leg using endoscopic vein harvest  technique.  This vein was of medium size and good quality.  We initially  examined the saphenous vein graft adjacent to the right knee, but this  vein was small and felt to be suboptimal and was therefore not  harvested.   Then, the patient was heparinized and when an adequate activated  clotting time was achieved, the distal ascending aorta was cannulated  using a 20-French aortic cannula for arterial inflow.  Venous outflow  was achieved using a 2-stage venous cannula through the right atrial  appendage.  Antegrade cardioplegia and vent cannula was inserted in the  aortic root.   The patient was placed on cardiopulmonary bypass and distal coronaries  were identified.  He had severe diffuse calcific coronary disease in all  of his vessels.  The LAD was diffusely diseased with calcific plaque and  this extended down to the apex.  There was one area in the mid portion  of the vessel that was soft enough anteriorly to graft, although in this  area of the posterior wall, the vessel was completely calcified.  The  patient had a moderate-sized first diagonal branch that did not show off  on angiogram, which was obviously occluded and I could not see any lumen  within that vessel.  The second diagonal branch was a small-to-moderate  sized vessel that was mildly diseased and was graftable.  The first  marginal was a smaller graft of the vessel.  The second marginal was  smaller.  The third marginal was a large intramyocardial vessel that was  located in the mid portion and was suitable for grafting.  The fourth  marginal was also smaller diffusely diseased vessel and not felt to be  graftable.  The right coronary artery was diffusely diseased.  It gave  off a small-to-moderate sized posterior descending vessel that was  suitable grafting.  The posterior and lateral branch was  intramyocardial  and lying beneath a large vein and was not visualized.   Then, the aorta was cross-clamped and 1000 mL of cold blood antegrade  cardioplegia was administered in the aortic root with quick arrest of  the heart.  Systemic hypothermia to 28 degrees centigrade and topical  hypothermia with iced saline was used.  Temperature probe was placed in  the septum and an insulating pad in the pericardium.   The first distal anastomosis was performed to the first marginal branch.  The internal diameter of this vessel was about 1.5 mm.  The conduit used  was a segment of greater saphenous vein.  The anastomosis was performed  in a sequential side-to-side manner using continuous  7-0 Prolene suture.  Flow was noted through the graft and was good.   The second distal anastomosis was performed to the third marginal  branch.  The internal diameter of this vessel was about 1.75 mm.  The  conduit used was the same segment of greater saphenous vein.  The  anastomosis performed in a sequential end-to-side manner using a  continuous 7-0 Prolene suture.  Flow was noted through the graft and was  excellent.  Another dose of cardioplegia was given down the vein graft  and aortic root.   The third distal anastomosis was performed to the posterior descending  coronary artery.  The internal diameter was about 1.6 mm.  The conduit  used was a second segment of greater saphenous vein. The anastomosis  performed in an end-to-side manner using continuous 7-0 Prolene suture.  Flow was noted through the graft and was excellent.   The fourth distal anastomosis was performed to the second diagonal  branch.  The internal diameter was 1.6 mm.  The conduit used was a third  segment of greater saphenous vein.  The anastomosis performed in an end-  to-side manner using continuous 7-0 Prolene suture.  Flow was noted  through the graft and was excellent.   The fifth distal anastomosis was performed to the mid  LAD.  The internal  diameter of this vessel was about 1.5-1.6 mm.  A 1-mm probe would not  pass proximally very far.  A 1.5 probe was passed distally to the apex.  The conduit used was a left internal mammary graft, was brought through  an opening in the left pericardium anterior to the phrenic nerve.  It  was anastomosed to the LAD in an end-to-side manner using continuous 8-0  Prolene suture.  The pedicle was sutured to the epicardium with 6-0  Prolene sutures.  The patient was rewarmed to 37 degrees centigrade.  Another dose of cardioplegia was given.  With cross clamp in place, the  3 proximal vein graft anastomoses were performed to the aortic root in  an end-to-side manner using continuous 6-0 Prolene suture.  Then, the  clamp was removed from mammary pedicle.  There was rapid warming of the  ventricular septum and return of spontaneous ventricular fibrillation.  The cross-clamp was removed with time of 114 minutes and there was  spontaneous return sinus rhythm.  The proximal and distal anastomoses  appeared hemostatic and allowed the grafts satisfactory.  The graft  marker was placed around the proximal anastomoses.  Two temporary right  ventricular and right atrial pacing wires placed and brought out through  the skin.   When, the patient was rewarmed to 37 degrees centigrade, he was weaned  from cardiopulmonary bypass on no inotropic agents.  Total bypass time  was 141 minutes.  Cardiac function appeared good with a cardiac output  of 6 liters per minute.  Protamine was given and venous and aortic  cannulas were removed without difficulty.  Hemostasis was achieved.  Three chest tubes were placed with the tube in the posterior  pericardium, one in left pleural space and one in the anterior  mediastinum.  The pericardium was loosely reapproximated over the heart.  The sternum was closed with #6 stainless steel wires.  Fascia was closed  with continuous #1 Vicryl suture.   Subcutaneous tissue was closed with  continuous 2-0 Vicryl and the skin with 3-0 Vicryl subcuticular closure.  The lower extremity vein harvest site was closed in layers in a similar  manner.  The sponge, needles, and instrument counts were correct  according to the scrub nurse.  Dry sterile dressing applied over the  incisions around the chest tubes with Pleur-Evac suction.  The patient  remained hemodynamically stable and was transported to the SICU in  guarded, but stable condition.   In summary, this patient has severe diffuse 3-vessel coronary artery  disease with calcified coronaries and I would not recommend attempting  redo surgery in this patient in the future.      Evelene Croon, M.D.  Electronically Signed     BB/MEDQ  D:  05/21/2008  T:  05/22/2008  Job:  [id number]   cc:   [name], M.D.

## 2010-07-18 NOTE — Assessment & Plan Note (Signed)
OFFICE VISIT   [name], [name]  DOB:  03/13/1945                                        June 25, 2008  CHART #:  [id number]   The patient has returned today for followup, status post coronary artery  bypass graft surgery x5 on May 21, 2008.  He has had no chest pain or  shortness of breath.  He has continued to smoke, although he said he is  only smoking about 8 cigarettes per day now compared 2 packs per day  preoperatively.  His only complaint is of nausea and loss of appetite  after taking metformin.  He said he has stopped taking his medicine and  these symptoms completely resolved, but as soon as he takes it, they  come right back.  This was started by Dr. Patty Sermons postoperatively for  diabetes.   PHYSICAL EXAMINATION:  VITAL SIGNS:  Blood pressure 146/81, pulse is 90  and regular, respiratory rate is 18, unlabored.  Oxygen saturation on  room air is 98%.  GENERAL:  He looks well.  CARDIAC:  Regular rate and rhythm with normal heart sounds.  LUNGS:  Clear.  CHEST:  Chest incision is healing well and the sternum is stable.  EXTREMITIES:  Leg incision is healing well.  There is no lower extremity  edema.   Chest x-ray shows clear lung fields and no pleural effusions.   His medications are amlodipine 5 mg daily, Depakote 500 mg b.i.d.,  folate 1 mg daily, aspirin 325 mg daily, Nexium 40 mg daily, Lipitor 80  mg nightly, metformin 1000 mg b.i.d., and oxycodone p.r.n. for pain.   IMPRESSION:  Overall, the patient is recovering well following his  surgery.  He is going to call Dr. Patty Sermons to discuss changing his  Glucophage to another medication for his diabetes.  I encouraged him to  continue walking as much as possible.  I told him he could return to  driving a car, but should refrain from lifting anything heavier than 10  pounds for a total of 3 months from date of surgery.  I strongly  encouraged him to completely abstain from cigarette  smoking.  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   Evelene Croon, M.D.  Electronically Signed   BB/MEDQ  D:  06/25/2008  T:  06/26/2008  Job:  [id number]

## 2010-07-18 NOTE — Discharge Summary (Signed)
NAMEBRECKON, Jacob               ACCOUNT NO.:  [id number]   MEDICAL RECORD NO.:  [id number]          PATIENT TYPE:  INP   LOCATION:  4732                         FACILITY:  [facility]   PHYSICIAN:  Altha Harm, MDDATE OF BIRTH:  Nov 08, 1945   DATE OF ADMISSION:  08/15/2007  DATE OF DISCHARGE:                               DISCHARGE SUMMARY   INTERIM DISCHARGE SUMMARY:   DATE OF DISCHARGE:  Not yet determined.   DISCHARGE DIAGNOSES:  1. 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  2. Hypertensive urgency, resolved.  3. Diabetes type 2.  4. Hyperlipidemia.  5. Gait disturbance and ataxia.  6. History of coronary artery disease, status post percutaneous      transluminal coronary angioplasty in the past.  7. Bradycardia.   DISCHARGE MEDICATIONS:  To be determined at the time of discharge.   CONSULTANTS:  1. Pramod P. Pearlean Brownie, MD, neurology.  2. [name], M.D., cardiology.   PROCEDURES:  1. Status post cerebral angiogram which shows:  An      angiographically0occluded right vertebral tubal artery at the level      of C1.  A 70-75% stenosis of the left vertebrobasilar junction      distal to the left PICA and approximately 70-80% stenosis of the      right vertebrobasilar junction distal to the right PICA.      Approximately 50% stenosis of the mid basilar artery.  A 30-40%      stenosis of the left vertebral artery origin.  Moderate calcific      atherosclerotic disease with minimal stenosis involving the left      internal carotid artery proximally.  2. MRA of the head, which shows the occlusion of the distal right      vertebral artery consistent with the findings on the angiogram.  3. MRI of the brain, which shows acute infarct in the right cerebellum      and with occluded right distal vertebral artery.  4. CT of the head without contrast done on June 12, which shows no CT      evidence of acute infarction.  5. 2-D  echocardiogram ordered at admission, results are still pending.   ALLERGIES:  None.  Metformin causes abdominal pain in the patient.   CODE STATUS:  Full code.   CHIEF COMPLAINT:  Weakness, falling to the left side and dizziness.   HISTORY OF PRESENT ILLNESS:  Please see the H&P dictated by Dr. Ashley Royalty  for details of the HPI.  However, this is a 65 year old gentleman with a  history of hypertension, diabetes, coronary artery disease and status  post CVA in the past who was driving down the road when he was noted to  have some tingling on the right side of his face and weakness on the  left.  The patient also complained of profound dizziness and was brought  to the emergency room for further evaluation.   HOSPITAL COURSE:  1. Hypertensive emergency.  In the emergency room the patient had      blood pressures in excess  of 220 systolic over 115 diastolic.  The      patient was treated initially with nitroglycerin drip and with IV      hydralazine, which initially brought the blood pressure down.      However, the patient began to have dizziness in excess and      vomiting.  The patient was sent urgently for an MR of the brain      which showed a cerebellar infarct, which would explain the      patient's symptoms of dizziness.  The patient was then changed over      to a Cardene drip and admitted to the ICU, where he resolved his      hypertension and was put on oral medications.  He is still in      discussion with neurology about alternatives to this treatment.      Norvasc 5 mg p.o. daily, hydrochlorothiazide 12.5 mg p.o. daily and      lisinopril 20 mg p.o. daily.  A beta blocker has been avoided in      this patient despite his history of having an MI in the past due to      the fact that the patient had significant bradycardia with heart      rates down into the 30s.  2. Cerebellar infarct.  As noted, the patient was found to have a      cerebellar infarct on MRI of the brain.   MRI and three-way      angiogram showed findings as noted above.  The patient was offered      opportunity for stenting of the vertebrobasilar arteries, which he      has declined at this time.  Neurology is still in discussion about      alternatives to this treatment.  A homocysteine level was also done      which was found to be 16.5, and the patient was started on folic      acid.  In terms of his gait disorder and his decreased function as      a result of the stroke, the patient has been evaluated by PT, OT      and speech therapy, all of whom feel that the patient would benefit      from inpatient rehab.  The patient, however, is very reluctant to      consent to this and is in discussion right now with rehab and with      an admissions coordinator from rehab.  I have spoken with the      patient's daughter Florentina Addison and made her aware of the patient's need      for rehab and his apprehension about consenting to it.  The family      will have further discussion with the patient to try to convince      him to accept inpatient rehab.  3. Bradycardia.  The patient was found to be bradycardic, thus beta      blockers were avoided.  Dr. Deborah Chalk, who was covering for Dr.      Patty Sermons, was consulted with this.  Currently they are still      titrating medications on the patient.  Dr. Patty Sermons has been      seeing the patient since yesterday.  At this point he has made no      further recommendations for any interventions or treatments      regarding the bradycardia.  4. Diabetes type  2.  The patient's blood sugars have been well-      controlled during this hospitalization.  A hemoglobin A1c was done      on admission to the hospital, which was found to be 6.4.  5. Hyperlipidemia.  The patient was found to have a markedly elevated      cholesterol of 303 with an LDL of 231, a triglyceride of 184.  The      patient was put on pharmacologic therapy for his hyperlipidemia and      is to  continue to have it rechecked in 6 weeks for need for      titration.   This brings me up to speed on this patient's hospital course up until  the 16th.      Altha Harm, MD  Electronically Signed     MAM/MEDQ  D:  08/19/2007  T:  08/19/2007  Job:  [id number]   cc:   [name], M.D.  Pramod P. Pearlean Brownie, MD

## 2010-07-18 NOTE — H&P (Signed)
NAME:  THINH, CUCCARO NO.:  [id number]   MEDICAL RECORD NO.:  [id number]            PATIENT TYPE:   LOCATION:                               FACILITY:  [facility]   PHYSICIAN:  Vesta Mixer, M.D.      DATE OF BIRTH:   DATE OF ADMISSION:  09/18/2006  DATE OF DISCHARGE:                              HISTORY & PHYSICAL   HISTORY:  [name] is a 65 year old gentleman with a history of  coronary artery disease.  He is admitted for a heart catheterization  after having episodes of chest pain and electrocardiogram changes.   Mr. Danese has a long history of coronary artery disease, peripheral  vascular disease and chronic obstructive pulmonary disease.  He was  admitted in January and had a heart catheterization.  At that time he  was found to have moderate to severe disease involving his left  circumflex coronary artery and a completely occluded right coronary  artery that filled via the collaterals.  His LAD had only moderate  stenosis and he was treated medically.  He did fairly well for the next  six months, but then over the past several days has started having  episodes of chest discomfort.  These episodes of chest discomfort  occurred various times and are not necessarily associated with exertion.  He has had several episodes of rest.  They typically are relieved with  nitroglycerin.  He also has noticed left exertional chest pain.  He  presented today to see Dr. [name] and was found to have some  mild electrocardiogram changes.  He is pain-free today.  He denies any  syncope or pre-syncope.  He denies any PND or orthopnea.   CURRENT MEDICATIONS:  1. Lipitor 80 mg q.d.  2. Ramipril 10 mg q.d.  3. Plavix 75 mg q.d.  4. Zetia 10 mg q.d.  5. Nexium 40 mg q.d.  6. Micardis/hydrochlorothiazide 80/12.5 mg once q.d.  7. Toprol XL 100 mg q.d.  8. Lovaza one q.d.  9. Viagra p.r.n.   ALLERGIES:  The patient is intolerant to METFORMIN which causes  abdominal pain.   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.  He smokes  one pack of cigarettes a day.  He does not drink.   FAMILY HISTORY:  Strongly positive for coronary artery disease in his  father and multiple uncles.   REVIEW OF SYSTEMS:  He has not been experiencing any bleeding ulcers.  He denies any heat or cold intolerance, weight gain or weight loss.  He  denies any syncope or pre-syncope, PND or orthopnea.   PHYSICAL EXAMINATION:  GENERAL:  He is a middle-aged gentleman, in no  acute distress.  He is alert and oriented x3.  His mood and affect are  normal.  VITAL SIGNS:  Weight 193 pounds, blood pressure 160/90, heart rate 54.  HEENT/NECK:  Reveals 2+ carotids.  He has no bruits, no jugular venous  distention, no thyromegaly.  LUNGS:  Clear to auscultation.  HEART:  Regular rate.  S1 and S2.  ABDOMEN:  Good bowel  sounds.  Nontender.  EXTREMITIES:  No clubbing, cyanosis or edema.  NEUROLOGIC:  Nonfocal.   His electrocardiogram reveals sinus bradycardia.  He has voltage for  left ventricular hypertrophy.  He has T-wave inversions in leads V5 and  V6.  These are new from his previous electrocardiogram in December 2007.   IMPRESSION:  Mr. Polo presents with known coronary artery disease.  He is having symptoms of unstable angina.  He now has some  electrocardiogram abnormalities.   PLAN:  I have strongly advised him to stop smoking.  Because of his  symptoms, we will admit him to the hospital tomorrow for a diagnostic  heart catheterization.  He has severe disease involving the left  circumflex artery, the right coronary artery and I suspect that at some  point he will need coronary artery bypass graft surgery.  We have held  off because his left anterior descending coronary artery is not all that  severe.   He is also mildly hypertensive.  We will increase the  hydrochlorothiazide component of his Micardis/hydrochlorothiazide.  We  have  discussed the risks, benefits and options of heart catheterization.  He understands and agrees to proceed.           ______________________________  Vesta Mixer, M.D.     PJN/MEDQ  D:  09/17/2006  T:  09/17/2006  Job:  [id number]   cc:   [name], M.D.

## 2010-07-21 NOTE — H&P (Signed)
NAME:  [name], [name] NO.:  [id number]   MEDICAL RECORD NO.:  [id number]                   PATIENT TYPE:  OIB   LOCATION:                                       FACILITY:  [facility]   PHYSICIAN:  Vesta Mixer, M.D.              DATE OF BIRTH:  [birth year]   DATE OF ADMISSION:  07/28/2002  DATE OF DISCHARGE:                                HISTORY & PHYSICAL   HISTORY OF PRESENT ILLNESS:  The patient is a middle-aged gentleman with a  history of hypertension, hypercholesterolemia, and coronary artery disease.  He has had a history of ongoing cigarette abuse.  He presents today for  evaluation of continued episodes of angina.   The patient was last seen by me in January of 2002.  At that time, he had a  heart catheterization which revealed a significant disease involving the  left circumflex artery and right coronary artery.  The right coronary artery  was completely occluded and filled via left to right collaterals. The  circumflex disease was fairly diffuse and did not appear to be a good  candidate for angioplasty.  He was treated medically at that time.  His left  anterior descending artery had a previous stent procedure and had a 30 to  40% just after the stent.   He did fairly well for years.  Recently he started having episodes of chest  pain.  He did not use any nitroglycerin because he states that it gives him  such a headache.  His significant other who accompanied him to the office  today stated that he really has been having chest pain for the past several  weeks, but did not want to admit it or use nitroglycerin because of the side  effects.   He presents today for further evaluation and for pre catheterization visit.   CURRENT MEDICATIONS:  1. Lipitor 80 mg a day.  2. Norvasc 10 mg a day.  3. Toprol XL 100 mg a day.  4. Altace 10 mg a day.  5. Plavix 75 mg a day.  6. Nexium 40 mg a day.  7. Hydrochlorothiazide 25 mg a day.  8.  Zetia 10 mg a day.   ALLERGIES:  No known drug allergies.   PAST MEDICAL HISTORY:  1. Coronary artery disease.  2. Hypertension.  3. Hypercholesterolemia.  4. Ongoing cigarette abuse.   SOCIAL HISTORY:  The patient continues to smoke.   REVIEW OF SYSTEMS:  Essentially negative.   PHYSICAL EXAMINATION:  GENERAL: He is a middle-aged gentleman in no acute  distress.  He is alert and oriented x3. His mood and affect are normal.  VITAL SIGNS:  Weight 191, blood pressure 140/96 with a heart rate of 80.  HEENT:  2+ carotids.  There are no bruits.  No JVD and no thyromegaly.  LUNGS:  Clear to auscultation.  HEART:  Regular rate and  rhythm S1 and S2, with no murmurs, rubs, or  gallops.  ABDOMEN:  Good bowel sounds and is nontender.  EXTREMITIES:  No cyanosis, clubbing, or edema.  NEUROLOGY:  Nonfocal.   Recent EKG reveals normal sinus rhythm. He has no acute ST or T wave  changes. He does have small Q waves in the inferior leads.   The patient presents with increasing episodes of angina.  I have agreed that  we need to proceed with heart catheterization.  I suspect that he may need  coronary artery bypass graft at some time.  I encouraged him to use  nitroglycerin or to at least use a half of nitroglycerin pill to help  relieve some of these episodes of chest pain.  We have scheduled a  catheterization for next week.  I have asked him to call me right away if he  has any worsening problems.                                               Vesta Mixer, M.D.    PJN/MEDQ  D:  07/23/2002  T:  07/23/2002  Job:  [id number]   cc:   [name], M.D.  [address]  [place]  [place] [address]  Fax: [phone]   Teena Irani. Arlyce Dice, M.D.  [address]  [place]  [place] [address]  Fax: [phone]

## 2010-07-21 NOTE — H&P (Signed)
. [facility]  Patient:    [name], [name]                      MRN: [id number] Adm. Date:  [id number] Attending:  Lyn Records. Iii Dictator:   Anselm Lis, N.P. CC:         Clovis Pu. Patty Sermons, M.D.  Teena Irani. Arlyce Dice, M.D.   History and Physical  CARDIOLOGIST:  Maisie Fus A. Patty Sermons, M.D., [facility].  PRIMARY CARE Nylan Nevel:  Teena Irani. Arlyce Dice, M.D., at [facility].  DATE OF BIRTH:  1945/07/21  PROBLEMS: 1. Unstable angina pectoris in a 65 year old with known history of coronary    artery disease; prior stent proximal/mid coronary artery disease,    percutaneous transluminal coronary angioplasty mid circumflex, and stent    proximal and ostial obtuse marginal #2, August 2001 (Richard A.    Alanda Amass, M.D.).  He has known old proximal right coronary artery stenosis    with collaterals from right and left coronary artery. 2. History of hypertensive cardiac disease with severe concentric left    ventricular hypertrophy.  Ejection fraction of 50%.  Mild hypokinesis,    basilar inferior wall; anterolateral and posterior apical hypokinesis (from    catheterization August 2001).  Internal mammary arteries okay from heart    catheterization August 2001. 3. History of dyslipidemia; Lipitor was increased from 40 to 80 p.o. q.d. a    few days earlier. 4. History of hypertension. 5. Atherosclerotic peripheral vascular disease; 40% right renal artery; 50/60%    left common iliac artery and 40% right iliac artery. 6. Ongoing tobacco abuse. 7. History of chronic obstructive pulmonary disease/chronic bronchitis. 8. No surgical history.  HISTORY OF PRESENT ILLNESS:  [name] is a pleasant 64 year old gentleman with known history of CAD; status post proximal and mid LAD stents, proximal ostial OM #2 stents and PTCA of mid circumflex August of last year.  He has known high-grade (question 100%) RCA occlusion with dual  collateral flow from proximal right and left coronary artery system.  He has known hypertensive heart disease, ongoing tobacco abuse, history of dyslipidemia and hypertension.  [name] has been experiencing anterior chest tightness with exertion over the last two days, interestingly, occurring only after noontime. He would have associated nausea.  He presented to his primary care doctor where he was triaged to [facility] emergency room.  He remained pain-free.  His first set of cardiac enzymes are negative.  His EKG reveals NSR with inferolateral T wave abnormalities.  He was started on Lovenox the evening of admission subcu.  He has had nonsustained runs of ventricular tachycardia, rare, but longest run was six beats.  PAST MEDICAL HISTORY:  As above.  PAST SURGICAL HISTORY:  None.  ALLERGIES:  No known drug allergies.  Okay with seafood, shellfish, and iodine products.  MEDICATIONS:  Patient could not remember his list of medications but believes nothing has been changed except increase in Lipitor from discharge last August.  From that admission, they are as follows: 1. Lipitor increased to 80 mg p.o. q.d. 2. Altace 2 mg p.o. q.d. 3. Aspirin 325 mg two tablets q.i.d. 4. Norvasc 10 mg p.o. q.d. 5. Toprol XL 50 mg per day. 6. Imdur 30 mg p.o. q.d. 7. Ranitidine.  SOCIAL HISTORY AND HABITS:  Tobacco:  One pack per day for 40 years. ETOH:  Negative.  Patient is divorced from his first wife; lives with a companion  with whom he has a 103-year-old child.  A 73 year old daughter and 27 year old child from prior marriage.  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  FAMILY HISTORY:  Mother is 63, alive and well, though she has had a prior heart attack, age 74s.  She is a patient of Dr. Patty Sermons.  Father died of a heart attack, age 32s.  Patient has five siblings (full-blooded) of whom one brother had a heart attack at age 89.   He has two half-brothers one of whom had a heart attack at age 65.  He has two half-sisters.  REVIEW OF SYSTEMS:  As in HPI.  PAST MEDICAL HISTORY:  Otherwise, he complains of episodic light-headedness, dizziness, and some extremity numbness and weakness.  He does get some choking episodically when he tries to swallow food, upper esophagus.  Denies melena, bright red blood per rectum, constipation, or diarrhea.  No dysuria or hematuria.  Arthritic-type complaints in his fingers.  He denies pedal edema, orthopnea, nor PND.  PHYSICAL EXAMINATION:  VITAL SIGNS:  Blood pressure 135/82, heart 67 and regular, respiratory rate 20, temperature 98.5.  GENERAL:  He is a well-nourished, middle-aged male in no acute distress.  HEAD, EYES, EARS, NOSE, and THROAT:  Brisk bilateral carotid upstroke without bruits.  NECK:  No JVD.  CHEST:  Lung sounds clear with equal bilateral excursion.  CARDIAC:  Regular rate and rhythm without murmur, rub, or gallop.  Normal S1 and S2.  ABDOMEN:  Soft, nondistended, normal active bowel sounds.  Negative abdominal aorta, renal, and femoral bruit.  EXTREMITIES:  Distal pulse intact; negative pedal edema.  NEUROLOGIC:  Cranial nerves 2-12 grossly intact; alert and oriented x 3.  GENITORECTAL:  Exam is deferred.  LABORATORY TESTS AND DATA:  Sodium 138, K 3.6, chloride 104, BUN 15, creatinine 1.8, with repeat creatinine of 1.4.  Glucose 103.  Hemoglobin 15.5 with hematocrit of 44.3, WBC 9.5, and platelets of 312.  CK 44 with MB fraction of 0.6 and troponin I of 0.01 x 2.  EKG revealed inferior lateral T wave abnormalities.  IMPRESSION:  As per problem list above.  PLAN: 1. Cardiac catheterization by Alvia Grove., M.D.  Risks, potential    complications, benefits, and alternatives of procedure discussed in detail.    Patient indicates questions and concerns have been addressed and is     agreeable to proceed. 2. Will advise patient at time  of discharge to change his aspirin to enteric    coated once daily. DD:  03/15/00 TD:  03/15/00 Job: [id number]

## 2010-07-21 NOTE — Op Note (Signed)
[place]  Patient:    CAUY, MELODY                      MRN: [id number] Proc. Date: 11/03/99 Adm. Date:  [id number] Attending:  Berry, Jonathan [place] CC:         Teena Irani. Arlyce Dice, M.D.  Lennette Bihari, M.D.   Operative Report  PROCEDURE:  Retrograde central aortic catheterization, selective coronary angiography by Judkins technique, left ventricular angiogram RAO and LAO projections, subselective LIMA, RIMA, abdominal aortic angiogram midstream PA projection, proximal iliac angiogram PA projection, PTCA and stent high grade proximal - mid LAD stenosis in the setting of unstable angina pectoris, PTCA using double wire technique, mid circumflex stenosis, and stent proximal, and ostial OM-2 stenosis, aggrastat bolus plus infusion, weight adjusted heparin, pretreatment with aspirin and plavix.  DESCRIPTION OF PROCEDURE:   This 65 year old white divorced father of 3 with 3 grandchildren is a heavy smoker and works full time at a [employer].  He has a long history of coronary disease but has been treated medically for multivessel disease.  He has hypertension, severe LVH with concentric thickening of the IVS and LVPW, good systolic function and his last catheterization was, April 12, 1999, and demonstrated moderately severe LAD, CX-OM disease and total mid RCA with collaterals and moderate PVD without claudication.  He was admitted on November 02, 1999, with unstable angina, myocardial infarction was ruled out by serial enzymes and EKGs, and he was referred for coronary angiography.  This was done with 6 French 4 cm taper preformed Cordis coronary and pigtail catheters using Hexabrix dye throughout the procedure.  The RFA was entered by modified Seldinger technique with an 18 thin wall needle with a single anterior puncture and entire procedure was done through a 6 [place] short Cordis side arm sheath.  The patient was given intermittent  bolused heparin monitoring his ACP and during the interventional procedure was given Aggrastat bolus plus infusion, 150 mg of plavix p.o., intracoronary nitroglycerin 1400 mcg and intracoronary adenosine 24 mcg.  He was given versed and nubaine for sedation.  1% Xylocaine was used for local anesthesia.  Coronary angiography was followed by LV angiogram and then subselective LIMA and RIMA, and subsequently abdominal aortic angiogram.  The patient tolerated the diagnostic procedure well.  RESULTS:  HEMODYNAMICS:   LV 140/0; LVDP 16-18 mmHg.                 CA 140/70 mmHg.  There was no gradient between the LV and CA on catheter pullback.  FLUOROSCOPY:  Fluoroscopy showed mild to moderate LAD and proximal circumflex coronary calcification fluoroscopically and heavy proximal right coronary calcification.  There was no intercardiac or valvular calcification seen.  The LIMA and RIMA were widely patent.  There was no subclavian or brachial cephalic stenosis.  There was 50% proximal left vertebral concentric narrowing that was smooth but good antegrade flow of the vertebrals bilaterally.  The infrarenal abdominal aorta had moderately severe atherosclerotic disease. There was no aneurysm or stenosis, however, except for mild dilatation of the distal aorta before the iliac bifurcation.  The left common iliac had 50-60% concentric narrowing.  The right common iliac had 40% concentric narrowing. The hypogastrics were intact bilaterally.  The left renal artery had 40% narrowing and there were irregularities of the right renal artery, both were single.  The SMA and celiac axis were intact and the IMA was intact.  LEFT VENTRICULAR ANGIOGRAM:  The left ventricular angiogram demonstrated hypokinesis of the mid anterolateral wall and the posteroapical segment and mild hypokinesis of the basilar inferior wall.  Estimated EF, however, was approximately 50% and angiographically there was severe  concentric LVH.  There was no mitral regurgitation.  The left main coronary was large and normal except for nonobstructive calcification.  Left anterior descending:  The left anterior artery had irregularities in the proximal third.  There was 75% stenosis beyond the very small first diagonal and after the large septal perforator and a tandem 75% stenotic lesion in the mid portion of the LAD.  The remainder of the LAD had irregularities and there was about 40% narrowing beyond the second diagonal at the junction of the distal third but good flow to the apex where it bifurcated.  There was diffuse disease that was not high grade in the distal LAD.  There was a small optional diagonal branch that had irregularities.  There was a very thin first marginal branch that had tandem 70% proximal lesions with good antegrade flow.  In the mid circumflex there was a large OM-2 that had a high grade 95% ostial stenosis with proximal disease and tandem 80% stenosis.  The superior bifurcation branch had 90% stenosis but was very small.  The inferior branch was the large branch.  The circumflex in the mid portion just beyond this segmentally had 60% irregular narrowing and was of moderate size.  There were collaterals to the distal right coronary from the proximal circumflex and the distal circumflex.  The right coronary had subtotal 95-99% stenosis after a 90% proximal third lesion in the mid portion.  There was however, very faint antegrade slow flow to the distal LAD.  There was also collaterals from the left atrial branch to the distal LAD as well as from the left coronary system as outlined.  There is a chronic finding.  The patient has chronic right coronary occlusion and good collateralization from the proximal RCA, circumflex and distal LAD.  There is some jeopardy from the circumflex and LAD because of progression of disease in these vessels. The LAD has progressed at tandem lesions in  the proximal-mid portion and the  CX OM-2 is severely progressed and may be the patients culprit lesion.  The patient is a candidate at age 45 for intervention.  He does have hyperlipidemia under therapy, hypertension, and smoking.  He is nondiabetic.  Informed consent was obtained to proceed.  The patient was given weight adjusted heparin and intermittent bolus heparin throughout the procedure.  He was started on aggrastat bolus plus infusion and was given plavix and aspirin.  We approached the LAD first with a 6 [place] JL-4 guiding catheter for intubation.  The lesion was crossed with an HTS 0.014 inch wire.  Initially we felt that we could stent these 2 tandem lesions primarily and separately with good intervening vessel between them.  The distal lesion was therefore stented with a 3.09 AVES7 stent which was deployed at 12-35.  A second 30-12 stent was then deployed and the length of this actually did overlap the distal stent just beyond the DX1.  This was deployed at 12-49 and redilated at 13-40. Scout injections, however, showed that there was still residual stenosis of 60-70% just after the small DX1 which we felt needed to be covered so this was covered with a third tandem 3.0/9 S7 stent just beyond the DX1 and large SP2. This was successfully deployed at 10-38 and post dilated at the overlap  with 14-44.  Scout injection showed stenosis reduction of 20% in this segment of the LAD.  The small DX1 was intact and the large septal perforator was intact. There was a 40% disease beyond the DX2 that persisted and there was good TIMI-3 flow to the distal vessel.  The patient tolerated this well.  We then approached the circumflex artery, with the same guiding catheter.  Several guidewires had to be used and reshaped to enter the circumflex because of the entrance angle.  This was finally done with a [place] choice PT wire.  We were not able, because of poor wire movement, however, even  with a 2-0 balloon to enter the OM-2 branch with this wire.  Several wires were used including a 0.010 crossit.  We were able to cross the OM-2 with a 0.014 inch traverse wire.  The 2-0 balloon was then used to dilate the high grade ostial OM-2 stenosis (2.0/15 ACS open sail balloon) and a 14-43 and a 16-42 with this undersized balloon.  This over the wire balloon was then useful to exchange the guidewire to a long HTF wire which was placed in the distal OM-2.  A second wire was then used into the distal circumflex since there was haziness beyond the OM-2 and some plaque shift into the mid circumflex.  The mid circumflex was then dilated with 2.5/15 cross sail at 6-62 and 6-44.  The 2.5 balloon was then transferred to the OM-2 guidewire and this was redilated at 8-53.  There was focal and segmental dissection with haziness and the patient had chest pain with ST elevation when first dilating the OM-2.  There was inferior ST elevation compatible with posterior ischemia and hand injection showed patent vessels so I suspected this was from atheroembolic debris from the OM-2 initial dilatation since that was the first one done with the 2-0 balloon.  This was treated with intracoronary adeonise, a total of 12 mcg x 2, with resolution of pain and ST depression in about 10-15 minutes.  During that time the OM-2 was upgraded to a 2.5 balloon for dilatation and subsequent focal dissection.  It was felt best to stent the OM-2 and the 2.5/13 BX velocity was positioned covering the ostia with good visualization because of the double wire bifurcation and hand injections and deployed at 12-33 post dilated at 16-51.  Final size was approximately 2.8 mm.  There was focal stenosis just beyond the stent that required dilatation with the 2.5 balloon cross sail with resolution with IC nitroglycerin.  We then redilated the mid circumflex at 6-44, and 6-55.  There was some haziness in the mid circumflex but no  obvious dissection.  Good TIMI-3 flow. The mid circumflex had less than 30% residual narrowing. The OM-2 ostial lesion had 0 to less than 10% narrowing and the ostial appeared covered. There was good TIMI-3 flow to both branches, ST segment depression, ST segment changes resolved, and there was no chest pain.  The patient was clinically stable.  We felt best to terminate the procedure at this point.  The patient has successful tandem stenting of his proximal-mid LAD with good results.  Ultimately this will hold up in this patient with this good sized vessel.  He has bifurcation, stenting of the OM-2 and PTCA of the circumflex with double wire technique, and will need to be followed closely for this.  He has intact collaterals to the totally occluded RCA and segmental, and a good EF of 55% despite a mid anterolateral  and basilar inferior wall motion abnormality with significant concentric hypertrophy.  He has had episodic hypertension in the lab up to a 180 and 190 with chest pain.  That came down nicely down to 140-150 with medical therapy.  I recommend vigorous continued medical therapy and obviously at his young age he needs to discontinue smoking, get into rehabilitation program, continued treatment of hyperlipidemia.  I would consider adding ACE therapy to his regimen for his coronary disease and LVH. He might be a candidate for long-term plavix therapy in addition to aspirin as well.  CATHETERIZATION DIAGNOSES:  1. Atherosclerotic heart disease, unstable angina.  2. High grade 3 vessel disease.  3. 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  4. Segmental wall motion abnormality with well preserved global LV function,     EF 55%, severe LVH.  5. Systemic hypertension, hypertensive heart disease, LVH.  6. successful tandem PTCA and stenting, proximal-mid LAD.  7. successful bifurcation PTCA and stenting with stent - ostial and  proximal     OM-2 and PTCA mid CX.  8. Cigarette abuse.  9. COPD. 10. Chronic bronchitis. 11. Hyperlipidemia. DD:  11/03/99 TD:  11/04/99 Job: [id number]

## 2010-07-21 NOTE — H&P (Signed)
[name], [name]               ACCOUNT NO.:  [id number]   MEDICAL RECORD NO.:  [id number]          PATIENT TYPE:  INP   LOCATION:  4729                         FACILITY:  [facility]   PHYSICIAN:  [name], M.D. DATE OF BIRTH:  Nov 19, 1945   DATE OF ADMISSION:  01/31/2006  DATE OF DISCHARGE:                              HISTORY & PHYSICAL   CHIEF COMPLAINT:  Possible stroke.   HISTORY:  This 65 year old Caucasian gentleman is admitted to the  emergency room at [facility] with a possible TIA or stroke manifesting with  numbness of the left side of his face.  The patient states that he has  had this type of symptom off and on in the past, perhaps dating back  several months but this time it has been more persistent.  There has  been slight slurring of speech.  He has had a headache which is not  uncommon for him. He has a history of vascular disease and has a past  history of hypertension, hypercholesterolemia, coronary artery disease,  ongoing smoking and diabetes. He had cardiac catheterization by Dr.  Elease Hashimoto in 04/2002 showing three-vessel disease and showing patency of  the previous stent to the LAD which had been placed two years earlier.  Continued medical therapy was recommended.  The patient had a normal  adenosine Cardiolite stress test on 07/04/05.  He has had no recent  chest pain.   PRESENT HOME MEDICINES:  Are Lipitor or 80 mg daily, Toprol XL 100 mg  daily, Altace 10 mg daily, Nitrostat 1/150 sublingually p.r.n., Ecotrin  325 daily, Plavix 75 mg daily, Nexium 40 mg daily, Zetia 10 mg daily,  Micardis HCT 40/12.5 daily, Amaryl 4 mg taking half tablet daily.   The patient is allergic to metformin which causes abdominal pain.   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  He smokes a few cigarettes a day by his description. He has not  drunk any alcohol in 10 years.   The patient is presently divorced and  has a steady girlfriend.  He has two children by his previous wife.   REVIEW OF SYSTEMS:  Reveals that he has not been experiencing any peptic  ulcer disease.  He has occasional hemorrhoids with bleeding.  GENITOURINARY: No symptoms.  NEUROLOGICAL:  He has frequent headaches  which he attributes to eye strain and the need for possible new glasses.  RESPIRATORY:  Reveals occasional nonproductive cough.   FAMILY HISTORY:  Strongly positive for coronary disease in his father  and multiple uncles.  He also has a strong history of diabetes.   PHYSICAL EXAMINATION:  On physical exam blood pressure is 187/93, pulse  65 regular, respirations are normal.  General Appearance:  Reveals a  well-developed, well-nourished tan gentleman in no acute distress.  The  face shows slight asymmetry with smoothing of the nasolabial fold on the  left and slight drooping of mouth on the left.  The pupils were equal  and reactive.  Carotids reveal no bruits. Jugular venous pressure  normal.  CHEST:  Clear with scattered rhonchi. The heart reveals no  murmur, gallop or rub.  ABDOMEN:  Soft without hepatosplenomegaly or  masses.  EXTREMITIES:  He moves all extremities well.  He has good  peripheral pulses.  No edema.   His CT brain scan shows no acute change and shows an old lacunar  infarct. EKG shows normal sinus rhythm with LVH with strain. Initial  labs include hemoglobin 17.6, hematocrit 50.9, white count 6000,  platelet count normal, INR 1.0.  Sodium 138, potassium 3.9, BUN 9,  creatinine 1.1, blood sugar 206.  Liver function studies are normal.  EKG shows LVH and nonspecific T-wave changes.   DIAGNOSTIC IMPRESSION:  1. Probable recent small CVA with persisting left facial numbness.  2. Hypertensive cardiovascular disease.  3. Diabetes.  4. Hyperlipidemia.  5. Ongoing cigarette abuse.  6. Polycythemia probably secondary to cigarettes.  7. Known three-vessel  coronary artery disease status post stent to his      LAD in about 2002 with a normal adenosine Cardiolite in May 2007.   DISPOSITION:  Were admitting to telemetry.  Will get carotid Doppler's.  Will get a 2-D echo. Will get an MRI and MRA of the brain. Will ask Dr.  Billie Ruddy to see for neurology consultation. Will continue aspirin  and Plavix and other meds for now.  Will counsel about smoking  cessation.           ______________________________  [name], M.D.     TB/MEDQ  D:  01/31/2006  T:  02/01/2006  Job:  [id number]   cc:   Deanna Artis. Sharene Skeans, M.D.

## 2010-07-21 NOTE — H&P (Signed)
NAME:  [name], [name] NO.:  [id number]   MEDICAL RECORD NO.:  [id number]           PATIENT TYPE:   LOCATION:                               FACILITY:  [facility]   PHYSICIAN:  Vesta Mixer, M.D. DATE OF BIRTH:  [birth year]   DATE OF ADMISSION:  03/11/2006  DATE OF DISCHARGE:                              HISTORY & PHYSICAL   [name] is a 65 year old gentleman with a known history of three  vessel coronary artery disease.  He is admitted for heart  catheterization after having an abnormal stress Cardiolite study.  Mr.  Snyder has has no known three vessel coronary artery disease dating  back from a cath in 2002.  At that time, he was found to have a left  anterior descending artery stenosis with a previously placed LAD stent.  I do not have the exact date that the stent was placed.  He had a 20-30%  stenosis just prior to the stent and a 30-40% stenosis just after the  stent.  The left circumflex artery had a 70-80% stenosis at the origin  of the first obtuse marginal artery.  He also appeared to have a stent  placed in the left circumflex artery.  The right coronary artery is a  moderate size vessel and was dominant.  It was occluded/subtotally  occluded.  His left ventricular systolic function was well preserved  with an EF of 55%.  Because the patient's LAD looked fairly well  preserved, he was referred for medical management.   The patient recently had an episode of a TIA.  He has also felt fairly  fatigued and run down for the past couple weeks.  He has had lots of  shortness of breath with some intermittent episodes of chest pain.  He  had a stress Cardiolite study which revealed inferobasal ischemia.  He  is referred for heart catheterization for further evaluation.   CURRENT MEDICATIONS:  Lipitor 80 mg a day, Altace 10 mg a day, Plavix 75  mg a day, Zetia 10 mg a day, Nexium 40 mg a day, Micardis 80 mg/12.5 mg  once a day, Amaryl 2 mg a day, Toprol XL  50 mg a day, Aggrenox twice a  day, nitroglycerin as needed.   ALLERGIES:  None.   PAST MEDICAL HISTORY:  1. Coronary artery disease  2. COPD.  3. Peripheral vascular disease.   SOCIAL HISTORY:  The patient still smokes about half-a-pack cigarettes a  day.   FAMILY HISTORY:  Noncontributory.   PHYSICAL EXAMINATION:  GENERAL:  He is a middle aged gentleman in no acute distress.  He is  alert and oriented x3.  His mood and affect are normal.  VITAL SIGNS:  His weight is 189 which is down 6 pounds.  Blood pressure  is 140/90 with a heart rate of 88.  HEENT: Exam reveals 2+ carotids.  He has no bruits, no JVD, no  thyromegaly.  LUNGS: Clear to auscultation.  HEART: Regular rate, normal S1 and S2.  ABDOMEN:  Good bowel sounds, it is nontender.  EXTREMITIES:  He has no  clubbing, cyanosis or edema.  NEUROLOGICAL:  Exam is nonfocal.   [name] presents with episodes of progressive shortness of breath  and some chest discomfort.  He has an abnormal stress Cardiolite study.  He already has known coronary artery disease.  We have scheduled him for  heart catheterization.  We have discussed the risks, benefits and  options of heart catheterization.  He understands and agrees to proceed.           ______________________________  Vesta Mixer, M.D.     PJN/MEDQ  D:  03/08/2006  T:  03/08/2006  Job:  [id number]   cc:   [name], M.D.

## 2010-07-21 NOTE — Discharge Summary (Signed)
[name], [name]               ACCOUNT NO.:  [id number]   MEDICAL RECORD NO.:  [id number]          PATIENT TYPE:  INP   LOCATION:  4729                         FACILITY:  [facility]   PHYSICIAN:  [name], M.D. DATE OF BIRTH:  April 12, 1945   DATE OF ADMISSION:  01/31/2006  DATE OF DISCHARGE:  02/02/2006                               DISCHARGE SUMMARY   FINAL DIAGNOSIS:  1. Probable small cerebrovascular accident with resolving left facial      numbness.  2. Hypertensive cardiovascular disease.  3. Diabetes mellitus.  4. Hyperlipidemia.  5. Ongoing cigarette abuse.  6. Polycythemia probably secondary to cigarette usage.  7. Known three-vessel coronary artery disease with stenting of his LAD      in about 2002.   OPERATIONS PERFORMED:  Carotid Dopplers, transcranial Dopplers, 2-D  echo, MRI and MRA of brain.   HISTORY:  This 65 year old gentleman was admitted from work on the  morning of January 29, 2006 when he noted numbness of the left side of  his face. He has also had a headache.  He has multiple risk factors for  vascular disease including hypertension, hypercholesterolemia, coronary  disease, ongoing smoking, diabetes, and poor compliance to diet.  He had  cardiac catheterization in 2004 showing known three-vessel disease and  showing that his previous stent to the LAD was still patent.  His last  adenosine Cardiolite stress test on Jul 04, 2005 showed no reversible  ischemia.   PHYSICAL EXAM:  VITAL SIGNS:  Blood pressure was elevated at 187/93,  pulse was 65 regular.  HEENT:  Unremarkable except for some smoothing of the nasal labial fold  on the left and slight drooping of mouth on left. Speech was okay. The  carotids revealed no bruits.  CHEST:  Clear with scattered rhonchi.  HEART:  Heart revealed no murmur.  ABDOMEN: Negative.  EXTREMITIES:  Showed that he moves all extremities well and has good  peripheral pulses and no edema.  He had an initial CT brain  scan in  emergency room showing no acute change and showing old lacunar infarct.   INITIAL LABS:  Included a hemoglobin of 17.6, hematocrit 50.9, blood  sugars 206, BUN 9, creatinine 1.1.  Liver function studies were normal.  EKG showed only nonspecific ST-T wave abnormalities.  Chest x-ray showed  no active disease.   HOSPITAL COURSE:  The patient was admitted.  We continued him on his  aspirin and Plavix initially. We gave him counseling on smoking  cessation.  We asked Dr. Sharene Skeans of neurology to see the patient. We  went ahead and got carotid Dopplers, 2-D echo and MRI and MRA of the  brain. The studies showed evidence of remote lacunar infarcts and Dr.  Gerald Leitz' suggestion was to continue with aspirin and continue with  noninvasive workup.  He was placed on telemetry.  He had episodes  profound bradycardia early in the morning of February 01, 2006 and his  Toprol was decreased from 100 mg to 25 mg daily.  He had no further  bradycardia. EKG showed no acute changes. His two-dimensional  echocardiogram showed  no embolic source. Hemoglobin A1c was elevated at  7.3. He was seen by Dr. Pearlean Brownie who reinforced the need for smoking  cessation and recommended that aspirin be stopped and Aggrenox be  started.  This was done prior to admission.  Transcranial Doppler  results are still pending at the time of dictation.  By February 02, 2006  he was stable enough to be discharged home and he will be followed up by  neurology in 2 weeks and by Dr. Patty Sermons in 3 weeks.   DISCHARGE MEDICINES:  1. Lipitor 80 mg daily.  2. Altace 10 mg daily.  3. Plavix 75 mg daily.  4. Zetia 10 mg daily.  5. Nexium 40 mg daily.  6. Micardis HCT 40/12.5 one daily.  7. Amaryl 4 mg half tablet daily.  8. Toprol XL 25 mg daily.  9. Aggrenox one daily for 2 weeks and then twice a day.  10.Nitrostat 1/150 sublingually p.r.n.   He is to see Dr. Pearlean Brownie or Dr. Anne Hahn or Dr. Sharene Skeans in [facility] in about  2 weeks and see Dr. Patty Sermons in 3 weeks.  He is to  stop aspirin.  He may return to work on February 04, 2006.   CONDITION ON DISCHARGE:  Is improved.   ACCESSORY LABORATORY STUDIES:  Sodium 133, potassium 3.7, blood sugar  117, creatinine 1.1, BUN 7, calcium normal, CK-MB 1.9, troponin I 0.01.  B natruretic peptide less than 30, hemoglobin A1c 7.3.  Cholesterol is  220, triglycerides 304, HDL 33, LDL 126.           ______________________________  [name], M.D.     TB/MEDQ  D:  02/02/2006  T:  02/04/2006  Job:  [id number]   cc:   Pramod P. Pearlean Brownie, MD

## 2010-07-21 NOTE — Discharge Summary (Signed)
[place]  Patient:    [name], [name]                      MRN: [id number] Adm. Date:  [id number] Disc. Date: [id number] Attending:  Berry, Jonathan [place] Dictator:   [facility] CC:         Teena Irani. Arlyce Dice, M.D., primary care  Lennette Bihari, M.D., [facility]   Discharge Summary  ADMISSION DIAGNOSES:  1. Unstable angina.  2. History of coronary artery disease.     a. Last catheterization was April 12, 1999.  3. Peripheral vascular disease.     a. At last catheterization he had the following:        1. 20 to 30% left subclavian.        2. 30 to 40% right renal artery stenosis.        3. 70 to 80% left common iliac artery stenosis.  4. Normal left ventricular function.  5. Status post echocardiogram on June 20, 1999, revealing moderate severe     concentric left ventricular hypertrophy.  6. Mild mitral regurgitation on echo on June 20, 1999.  7. 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  8. Moderate aortic valve sclerosis without stenosis on June 20, 1999, echo.  9. Hyperlipidemia. 10. Hypertension.  DISCHARGE DIAGNOSES:  1. Unstable angina.  2. History of coronary artery disease.     a. Last catheterization was April 12, 1999.  3. Peripheral vascular disease.     a. At last catheterization he had the following:        1. 20 to 30% left subclavian.        2. 30 to 40% right renal artery stenosis.        3. 70 to 80% left common iliac artery stenosis.  4. Normal left ventricular function.  5. Status post echocardiogram on June 20, 1999, revealing moderate severe     concentric left ventricular hypertrophy.  6. Mild mitral regurgitation on echo on June 20, 1999.  7. 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  8. Moderate aortic valve sclerosis without stenosis on June 20, 1999, echo.  9. Hyperlipidemia. 10. Hypertension. 11. Status post  cardiac catheterization on November 02, 1999, by Richard A.     Alanda Amass, M.D.  He also performed a complex intervention of the     following:     a. Percutaneous transluminal coronary angioplasty stent to the proximal        mid left anterior descending.     b. Percutaneous transluminal coronary angioplasty stent at the bifurcation        of the circumflex and the second obtuse marginal and third obtuse        marginal.  Of note, during the procedure the patient had transient ST        elevation with pain during percutaneous transluminal coronary        angioplasty of the second obtuse marginal requiring intravenous        nitroglycerin and intravenous adenosine for relief.  The patient then        became pain-free and Side-to-side were back to baseline. 12. Status post pauses, therefore, beta-blocker dose decreased.  HISTORY OF PRESENT ILLNESS:  [name] is a 65 year old male with a history of significant three vessel CAD, hypertension, hyperlipidemia, tobacco use who presented to [facility] Emergency Room on November 02, 1999, with  complaints of chest pain.  He stated that at 2:15 p.m. 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  It was in the middle of his chest and was a burning and tightness sensation.  He did have shortness of breath.  As well, he had left arm heaviness and burning.  He became nauseous but no emesis.  He was outside so it was difficult to assess whether he had increased diaphoresis.  At the time of entry to ER his chest pain had resolved.  However, he did continue to have discomfort in his arm.  He stated that the chest pain had lasted for approximately one hour and was relieved after one sublingual nitroglycerin which was given to him at [facility].  He states that he was at work and a Radio broadcast assistant took him to [employer] and there he was given  aspirin 81 mg x 4 and sublingual nitroglycerin x 1.  EMS had given him no further treatments.  He states that for quite some time he has had no chest pain at rest but did have chest pain with exertion.  However, he had not noticed any increase in frequency or duration recently.  On examination at that time his heart rate was 93, blood pressure 165/91.  EKG revealed normal sinus rhythm at 62 beats per minute with nonspecific STT changes but no change since prior EKG.  At that time labs were pending.  At that time, we planned to admit [name] to telemetry.  We will check serial enzymes to rule out MI.  He will be placed on aspirin and Plavix, IV heparin and IV nitroglycerin.  As well, we plan to increase beta-blockers.  We will plan for cardiac catheterization and risk factor modification.  HOSPITAL COURSE:  On November 03, 1999, [name] underwent cardiac catheterization by Dr. Susa Griffins.  Please see his dictated catheterization report for further details.  This was a very complex procedure and is very detailed, so again, please see the dictated report.  Overall, the patient was found to have high grade three-vessel disease.  He had an old total RCA occlusion with dual collaterals from proximal right and left coronaries.  Dr. Alanda Amass was able to perform successful tandem PTCA and stenting with proximal mid LAD.  As well, he performed successful bifurcation PTCA and stenting with stent in the osteal and proximal OM2 and PTCA of the mid circumflex.  Of note, the patient did develop transient ST elevations with pain during PTCA of the OM2 requiring IV nitroglycerin and IV adenosine for relief.  The patient was then pain-free and ST changes were back to baseline. He planned to check postoperative enzymes and planned for Aggrastat and Plavix post procedure.  Also of note, in the catheterization he was found to have LVH with an EF of 55%.  He had 40% left renal artery  stenosis, 50 to 60% left  common iliac artery stenosis and 40% right common iliac artery stenosis.  IMAs were okay.  On November 04, 1999, [name] was without complaints.  CK enzymes post catheterization had revealed CK of 46, MB of 3.2.  Lipid profile revealed cholesterol 217, triglyceride 180, HDL 36, LDL 145.  At that time Lennette Bihari, M.D., planned to continue high dose Zocor and add a bile acid resin of Welchol for persistent elevated LDL of 145.  As well, at that time [name]  had developed some sinus pauses.  Therefore his atenolol was decreased to 50 mg q.d.  Because the beta-blocker would need to be decreased, we plan to increase the Altace to 10 mg a day for blood pressure control.  On November 05, 1999, [name] was doing well without complaints.  His potassium had been repleted and now was at 3.5.  His medications had been adjusted and he was now stable and felt to be ready for discharge home.  At this time he is afebrile at 96.8.  He has a pulse of 65, blood pressure 120/78, oxygen saturation 96% on room air. CBC and BMP are all stable and his groin is stable.  HOSPITAL CONSULTS:  None.  HOSPITAL PROCEDURES: Cardiac catheterization on November 03, 1999, by Dr. Susa Griffins. Please see his dictated catheterization report, as this was a very complicated and prolonged procedure.  He performed successful tandem PTCA and stenting of the proximal mid LAD.  As well, he performed successful bifurcation PTCA and stenting of the ostial and proximal OM2 and PTCA of the mid circumflex.  Please note that this was a very complex intervention and was a three hour procedure.  As well, the patient did develop transient ST elevation and pain during the PTCA of the OM2 requiring IV nitroglycerin and IV adenosine for relief. The patient then became pain-free and STs returned to baseline.  He did not have any elevated cardiac enzymes post procedure.  HOSPITAL LABORATORIES:   Lipid profile reveals cholesterol 217, triglyceride 180, HDL 36, LDL 145.  Cardiac enzymes are negative with CKs of 45, 45, 39, and 46.  CK-MBs 1.5, 1.2, 1.6, 3.2.  Troponin is 0.05, less than 0.03, and 0.04.  Metabolic profile was stable throughout the hospitalization.  He did have some transient hypokalemia that were repleted.  However, on November 05, 1999, metabolic profile showed sodium 138, potassium 3.5, glucose 97, BUN 10, creatinine 1.2.  LFTs were all within normal range.  As well, CBC was stable with wbc 7.9, hemoglobin 15.9, hematocrit 43.5 and platelets 250 on November 02, 1999.  This remained stable throughout the hospitalization.  Chest x-ray on November 02, 1999, shows cardiomegaly with no active disease.  EKG throughout the hospitalization remained the same with normal sinus rhythm, a Q in lead III only with nonspecific STT changes and no change since prior EKG.  As well, there were no new changes on EKG post procedure.  DISCHARGE MEDICATIONS:  1. Altace 10 mg one p.o. q.d.  2. Lipitor 40 mg q.h.s.  3. Welchol 625 mg three tablets twice a day.  4. Enteric coated aspirin 325 mg once a day.  5. Plavix 75 mg once a day for one month.  6. Norvasc 10 mg one p.o. q.d.  7. Toprol XL 50 mg one p.o. q.d.  8. Imdur 30 mg one p.o. q.d.  9. Nitroglycerin 0.4 mg sublingual as directed. 10. Ranitidine as before. 11. Stop gemfibrozil. 12. Stop Pletal.  DISCHARGE INSTRUCTIONS: 1. No strenuous activity, lifting greater than five pounds, driving or sexual    activity for three days. 2. Low salt, low fat diet. 3. May gently wash groin with water and soap. 4. Call the office at [phone] if any bleeding or increased size or pain    of the groin.  FOLLOW-UP:  On Tuesday call our office at [phone] to make an appointment to see Dr. Tresa Endo in two to three weeks. DD:  11/14/99 TD:  11/15/99 Job: [id number]

## 2010-07-21 NOTE — Cardiovascular Report (Signed)
NAME:  [name], [name]                         ACCOUNT NO.:  [id number]   MEDICAL RECORD NO.:  [id number]                   PATIENT TYPE:  OIB   LOCATION:  2891                                 FACILITY:  [facility]   PHYSICIAN:  Vesta Mixer, M.D.              DATE OF BIRTH:  Jul 27, 1945   DATE OF PROCEDURE:  07/28/2002  DATE OF DISCHARGE:                              CARDIAC CATHETERIZATION   INDICATIONS:  The patient is a 65 year old gentleman with a history of three  vessel coronary artery disease.  He is status post PTCA and stenting of his  left anterior descending artery in the past.  He has known disease involving  his left circumflex artery and his right coronary artery.  He was last  catheterized approximately two years ago.  He started having some recurrent  episodes of angina and is referred for a heart catheterization   PROCEDURE:  Left heart catheterization with coronary angiography.   PROCEDURE:  The right femoral artery was easily cannulated using a modified  Seldinger technique.   HEMODYNAMICS:  LV pressure was 142/32 with an aortic pressure of 142/76.   ANGIOGRAPHY:  The left main coronary artery has minor luminal  irregularities.   The left anterior descending artery has mild to moderate disease throughout.  The stent located in the mid segment is patent.  There is a minimal amount  of instent restenosis.  There is moderate stenoses on the distal aspect of  this stent.  The first and second diagonal vessel have mild to moderate  irregularities.   The left circumflex artery is a fairly large vessel and is diffusely  diseased.  There is a proximal 70-75% stenosis.  This is followed by a very  tortuous segment with a 60-70% stenosis.  Following this the circumflex  artery gives off a large first obtuse marginal branch which has a 90%  stenosis.  The remainder of the circumflex artery is diffusely diseased to  approximately 50%.   The right coronary artery is  occluded proximally.  It fills via left to  right and right to right collaterals.   The left subclavian artery is normal.   The left ventriculogram was performed in a 30 RAO position.  It reveals  normal left ventricular systolic function.  Ejection fraction is  approximately 65%.  There is no mitral regurgitation.   COMPLICATIONS:  None.   CONCLUSION:  Severe three vessel coronary artery disease.  He has a patent  stent in his left anterior descending.  He has moderate to severe disease in  his circumflex artery and severe disease involving his right coronary  artery.  He will continue with medical therapy.  We will have him follow up  with [name], M.D.  Vesta Mixer, M.D.    PJN/MEDQ  D:  07/28/2002  T:  07/28/2002  Job:  [id number]

## 2010-07-21 NOTE — Consult Note (Signed)
[name], [name]               ACCOUNT NO.:  [id number]   MEDICAL RECORD NO.:  [id number]          PATIENT TYPE:  INP   LOCATION:  4729                         FACILITY:  [facility]   PHYSICIAN:  Deanna Artis. Hickling, M.D.DATE OF BIRTH:  08-09-1945   DATE OF CONSULTATION:  01/31/2006  DATE OF DISCHARGE:                                 CONSULTATION   CHIEF COMPLAINT:  Left facial numbness and slurred speech.   HISTORY OF THE PRESENT CONDITION:  The patient is a 65 year old right-  handed Caucasian gentleman who has had recurrent episodes of  numbness  over the left side of his face over the past several months. The patient  has always had rather quick resolution of these symptoms. He also states  that he has numbness in his left arm when he is driving a car. If he  drops the arm down to his side, it feels better. He says that the  numbness extends from his shoulder all the way to his fingertips.   The patient has risk factors for stroke including hypertension,  dyslipidemia, type 2 diabetes mellitus, and smoking.   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 The patient had a recent  adenosine Cardiolite stress test [id number]. It was negative. He has not  had symptoms of chest pain, palpitations, nausea or diaphoresis.   CURRENT MEDICATIONS:  Lipitor, Toprol XL, Altace, aspirin, Plavix,  Nexium, Zetia, Micardis, Omacor, and Amaryl.   DRUG ALLERGIES:  METFORMIN.   REVIEW OF SYSTEMS:  The patient has normal appetite and fairly normal  sleep patterns. He has not had significant weight gain. HEAD/NECK:  No  otitis media, pharyngitis, sinusitis. PULMONARY:  No asthma, bronchitis,  pneumonia though he has occasional nonproductive cough presumably from  smoking. CARDIOVASCULAR:  History of heart disease and hypertension.  GASTROINTESTINAL:  No nausea, vomiting, diarrhea, constipation.  GENITOURINARY:  No urinary tract  infection or hematuria.  MUSCULOSKELETAL:  No fractures or skeletal deformities. SKIN:  No rash  or neurocutaneous lesions. HEMATOLOGIC:  No anemia, bruisability,  lymphadenopathy. ENDOCRINE:  He has diabetes. No thyroid disease.  NEUROPSYCHIATRIC:  No depression, anxiety.  NEUROLOGIC:  The patient has  frequent tension type headaches. No diplopia. He has had mild dysarthria  today, although he says his speaking voice is normal now. No dysphagia,  tinnitus, syncope, vertigo, weakness, gait disorder or loss of bowel or  bladder control. No seizures. A 12-system review is otherwise negative.   ALLERGIES:  No known environmental allergies.   FAMILY HISTORY:  Positive for coronary artery disease in his father and  uncles all who have had myocardial infarctions. Also positive for  diabetes mellitus.   SOCIAL HISTORY:  The patient is single. He has two children by his first  wife. He works at a [employer] as a Therapist, occupational. He smokes a few  cigarettes per day. He has not used any alcohol in 10 years.   PHYSICAL EXAMINATION:  VITAL SIGNS:  Temperature 97.4, blood pressure  170/94, resting pulse 66, respirations 20, oxygen saturation 99%, weight  193.4 pounds, capillary glucose 105.  HEENT:  No signs of infection.  NECK:  No bruits.  LUNGS:  Clear to auscultation.  HEART:  No murmurs. Pulses normal.  ABDOMEN:  Soft, nontender. Bowel sounds normal.  EXTREMITIES:  Well-formed without edema, cyanosis or altered tone.  NEUROLOGIC:  Mental status:  Awake, alert, attentive, appropriate. No  dysphasia, dyspraxia.  Cranial nerves:  Round reactive pupils. Normal  fundi. Full visual fields, simultaneous stimuli.  Extraocular movements  full and conjugate. Symmetric facial strength. I could not find any  sensory loss. He was able to protrude his tongue and elevate his uvula  in the midline. Motor examination:  Normal strength, tone, and mass.  Good fine motor movements. No pronator drift.  Sensation  intact to cold  and vibration, stereognosis. Cerebellar examination:  Good finger-to-  nose, rapid intact movements. No tremor, dystaxia. Speech, gait, and  station normal. He is able to walk on his heels and toes. Perform tandem  without difficulty. Reflexes were symmetrically diminished. The patient  had bilateral flexor plantar responses.   IMPRESSION:  1. Left facial numbness recurrent, 72.0.  2. Dysarthria 74.5. I am not persuaded that this man has had a stroke.      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 No acute lesions. MRI showed a very equivocal increased      diffusion signal in the left posterior frontal cortical region. I      do not see increased signal in the ADC or the exponential ADC      images. 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 All these are      subcentimeter in nature. 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   PLAN:  The patient will need a noninvasive workup and laboratory workup.  He should continue with his aspirin and Plavix. He has multiple risk  factors which are noted above including positive family history. I  appreciate the opportunity to participate in his care. He will be  followed by the stroke service.      Deanna Artis. Sharene Skeans, M.D.  Electronically Signed     WHH/MEDQ  D:  01/31/2006  T:  02/01/2006  Job:  [id number]   cc:   [name], M.D.

## 2010-07-21 NOTE — Cardiovascular Report (Signed)
[place]  Patient:    [name], [name]                      MRN: [id number] Proc. Date: 04/12/99 Adm. Date:  [id number] Attending:  Virgina Evener CC:         Teena Irani. Arlyce Dice, M.D.             [facility] Govern, office             Cardiac Catheterization Lab                        Cardiac Catheterization  INDICATIONS:  Mr. Leanard Dimaio is a 65 year old white married male with known AD. He had documented coronary disease by catheterization approximately one year ago with total occlusion of his RCA with left-to-left and right-to-left collaterals and diffuse disease in his LAD and circumflex.  He had done well for the past nine months on medical therapy.  Apparently last week while shoveling, he became very dizzy and had more pronounced chest pain, nausea, vomiting, and diaphoresis. He was seen in the office yesterday with a symptom complex suggesting some worsening of discomfort, and he is now referred for repeat catheterization.  HEMODYNAMIC DATA:  Central aortic pressure 119/65.  Left ventricular pressure 119/14.  ANGIOGRAPHIC DATA:  There is evidence for mild calcification of his coronary arteries. 1. The left main coronary artery is angiographically normal and bifurcated into an    LAD and left circumflex system. 2. The left anterior descending artery had evidence for a very small proximal    diagonal vessel which was 90% stenosed.  The LAD after this small first diagonal    vessel had 50% narrowing.  The mid LAD, after the second diagonal vessel, had    50% narrowing. 3. The circumflex coronary artery gave rise to a diminutive first marginal vessel.    The second marginal vessel was very small and diffusely diseased with tandem  80% stenoses, not significantly changed from one year ago.  The third marginal    vessel had a small branch that had a 90% stenosis.  The third marginal vessel    had segmental 60% and 50% stenoses.   The AV groove circumflex had 30% narrowing.    There was evidence for left-to-right collaterals to the right coronary artery    via the left injection. 4. The right coronary artery was subtotally occluded proximally, being 99%, and  then there was 99% mid stenosis before the crux.  There was extensive    collateralization via a conus branch to the distal RCA with filling of the PLA,    PDA, and distal RCA via this injection.  The left subclavian artery was injected and in the event CABG surgery was necessary.  The LIMA was widely patent.  There was mild 20-30% centric narrowing of the left subclavian.  Biplane cine left ventriculography revealed preserved global LV contractility.  The distal aortography showed 30-40% narrowing in the right renal artery. There was a diffuse 70-80% stenosis in the left common iliac artery, and 40-50% narrowing in the right common iliac artery.  IMPRESSION: 1. Normal LV function. 2. Evidence for coronary calcification with subtotal occlusion of the proximal    right coronary artery with extensive right-to-right and left-to-right    collaterals. 3. A 50% proximal, a 50% mid LAD narrowing with diffuse 90% stenoses in a small    first diagonal vessel. 4.  Circumflex vessel with diffuse narrowing of 80% in a small second marginal    vessel and 60% and 50% stenoses in the second marginal vessel with 30% mid AV    groove stenoses.  RECOMMENDATION:  Angiographically, the patients coronary arteries are not significantly changed from one year ago.  In fact, there does appear to be improved antegrade flow in the RCA territory, and the RCA collaterals continue to remain  extensive, both right-to-right and left-to-right.  The patient does have coronary calcification.  Based on his anatomy, angiograms will be reviewed, but most likely increased medical therapy will be recommended.  If the patient fails increased medical therapy, CABG revascularization  surgery may be necessary. DD:  04/12/99 TD:  04/12/99 Job: [id number]

## 2010-07-21 NOTE — Cardiovascular Report (Signed)
[facility]  Patient:    [name], [name]                      MRN: [id number] Proc. Date: 03/15/00 Adm. Date:  [id number] Disc. Date: [id number] Attending:  Lyn Records. Iii CC:         Thomas A. Patty Sermons, M.D.   Cardiac Catheterization  HISTORY:  Mr. Buffalo is a 65 year old gentleman with history of coronary artery disease.  He is status post PTCA and stenting of his left anterior descending artery.  He presented to the emergency room with persistent chest pain and is referred for cardiac catheterization for further evaluation.  PROCEDURE:  Left heart catheterization with coronary angiography.  CARDIOLOGIST:  Alvia Grove., M.D.  TECHNIQUE:  The right femoral artery was easily cannulated using a modified Seldinger technique.  HEMODYNAMICS:  The left ventricular pressure was 128/16 with an aortic pressure of 123/69.  ANGIOGRAPHY:  The left main coronary artery is basically unremarkable.  There are no obstructions.  The left anterior descending artery has moderate irregularities.  The proximal LAD is unremarkable.  It gives off two septal branches, each with a 30 to 40% stenosis proximally.  The mid LAD has a well expanded stent.  There is a 20 to 30% stenosis just prior to the stent and a 30 to 40% stenosis just after the stent.  The stent does not appear to be significantly stenosed within the actual stented segment.  The mid LAD has a mild to moderate irregularity between 20 to 30%.  There is a moderate size diagonal branch which is fairly normal.  The distal LAD has only minor luminal irregularities and no discrete stenoses.  The left circumflex artery has moderate to severe disease.  It is a moderate size vessel.  The circumflex artery has a 70 to 80% stenosis at the origin of a large obtuse marginal artery.  The stenosis is just proximal to a stent. The stent appears to be fairly well deployed, but there is minimal  restenosis within the stent and just after the stent.  There is a tiny obtuse marginal artery that arises in the middle of this complex lesion which has a 90% stenosis.  This vessel is quite small and is not a candidate for revascularization.  The distal circumflex artery has diffuse irregularities between 40 and 50%.  The flow down the distal vessel is well preserved.  The right coronary artery is a moderate size vessel and is dominant.  It is known to be occluded from previous heart catheterizations.  There is a subtotal occlusion in the proximal vessel.  The mid vessel has diffuse 80 to 90% stenosis.  The distal vessel has only minor irregularities.  The distal right coronary artery and posterolateral segment artery fills the collaterals from the left system.  This vessel is basically unchanged from previous heart catheterizations.  LEFT VENTRICULOGRAM:  A left ventriculogram was performed in the 30 RAO position.  It reveals well preserved left ventricular systolic function with an ejection fraction of 55%.  There is minimal mitral regurgitation probably due to the catheter.  The left ventricular size is fairly normal.  COMPLICATIONS:  None.  CONCLUSIONS:  Moderate coronary artery disease primarily involving the left circumflex artery and the right coronary artery.  The left anterior descending artery remains fairly patent, but there are moderate irregularities.  The circumflex lesion is fairly complex and is primarily located just proximal to the previously  placed stent.  The right coronary artery is severely diseased, but there is intact collateral flow.  The right coronary artery is diffusely diseased and would be a relatively poor candidate for percutaneous intervention.  We will continue with medical therapy. DD:  04/05/00 TD:  04/06/00 Job: [id number]

## 2010-07-21 NOTE — Cardiovascular Report (Signed)
NAME:  Jacob, Snyder NO.:  [id number]   MEDICAL RECORD NO.:  [id number]          PATIENT TYPE:  OIB   LOCATION:  1963                         FACILITY:  [facility]   PHYSICIAN:  Vesta Mixer, M.D. DATE OF BIRTH:  Dec 05, 1945   DATE OF PROCEDURE:  03/11/2006  DATE OF DISCHARGE:                            CARDIAC CATHETERIZATION   HISTORY:  [name] is a 65 year old gentleman with a history of known  coronary artery disease.  He has a stent in his mid LAD and he also has  a stent in his first obtuse marginal.  He is known to have an occluded  right coronary artery.  He is referred for heart catheterization after  having an abnormal stress Cardiolite study.  The patient has had several  episodes of syncope recently.   PROCEDURE:  Left heart catheterization.   The left femoral artery was easily cannulated using a modified Seldinger  technique.   HEMODYNAMICS:  The LV pressure was 108/6 with an aortic pressure of  108/65.   Angiography, left main.  The left main is fairly normal.   The left anterior descending artery is diffusely diseased throughout its  course.  There are mild-to-moderate irregularities throughout the course  of the LAD.   There is a 40-50% proximal stenosis.  There is a 30-40% in-stent  restenosis in the mid LAD.  The large diagonal branch has a proximal 30-  40% stenosis.  The LAD is very heavily calcified.   The left circumflex artery is severely and diffusely diseased throughout  its course.  There is a proximal 70-80% stenosis.  The obtuse marginal  artery bifurcates almost immediately as it arises from the circumflex  artery.  The first obtuse marginal branch has a 70% proximal stenosis  that is located within the stent placed previously.  Following this,  there is another long 70-80% stenosis.   The second obtuse marginal branch has minor luminal irregularities and  is a smaller vessel.   The right coronary artery is occluded  proximally.  It fills via  collaterals from the left-to-right system and also very faintly from  right-to-right collaterals.   The left ventriculogram was performed in the 30 RAO position.  It  reveals, overall, normal left ventricular systolic function.  The  ejection fraction is around 65%.   COMPLICATIONS:  None.   CONCLUSION:  Primarily two-vessel coronary artery disease involving the  left circumflex artery and the right coronary artery.  The left anterior  descending artery has moderate disease but it does not appear that these  lesions are critical.  We will continue with medical therapy.  He could  be considered for bypass grafting of the circumflex  artery and the right coronary artery.  We also could consider putting a  saphenous vein graft to his LAD since he does have a 50% stenosis.  I  worry that placing an IMA to the LAD would only result in causing IMA to  become atretic since the flow down the LAD is still fairly good.  He  will follow up with Dr. Patty Sermons.  ______________________________  Vesta Mixer, M.D.     PJN/MEDQ  D:  03/11/2006  T:  03/11/2006  Job:  [id number]   cc:   [name], M.D.

## 2010-08-02 ENCOUNTER — Encounter: Payer: Self-pay | Admitting: Cardiology

## 2010-08-03 ENCOUNTER — Ambulatory Visit (INDEPENDENT_AMBULATORY_CARE_PROVIDER_SITE_OTHER): Payer: BC Managed Care – PPO | Admitting: Cardiology

## 2010-08-03 ENCOUNTER — Encounter: Payer: Self-pay | Admitting: Cardiology

## 2010-08-03 VITALS — BP 150/120 | HR 80 | Ht 66.0 in | Wt 183.2 lb

## 2010-08-03 DIAGNOSIS — Z9889 Other specified postprocedural states: Secondary | ICD-10-CM

## 2010-08-03 DIAGNOSIS — I119 Hypertensive heart disease without heart failure: Secondary | ICD-10-CM

## 2010-08-03 DIAGNOSIS — Z720 Tobacco use: Secondary | ICD-10-CM

## 2010-08-03 DIAGNOSIS — R066 Hiccough: Secondary | ICD-10-CM

## 2010-08-03 DIAGNOSIS — Z951 Presence of aortocoronary bypass graft: Secondary | ICD-10-CM | POA: Insufficient documentation

## 2010-08-03 DIAGNOSIS — F17200 Nicotine dependence, unspecified, uncomplicated: Secondary | ICD-10-CM

## 2010-08-03 HISTORY — DX: Hiccough: R06.6

## 2010-08-03 MED ORDER — METOPROLOL SUCCINATE ER 100 MG PO TB24: 100.0000 mg | ORAL_TABLET | Freq: Every day | ORAL | Status: DC

## 2010-08-03 NOTE — Assessment & Plan Note (Signed)
The patient had coronary artery bypass graft surgery on 05/21/08.  He has not been having any recurrent angina pectoris.  He has not been having any symptoms of congestive heart failure.  His EKG today shows LVH with strain.

## 2010-08-03 NOTE — Assessment & Plan Note (Signed)
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  He is a cups occur about once a week and last about 2 days.  Nothing that he has tried has helped.

## 2010-08-03 NOTE — Assessment & Plan Note (Signed)
The patient is seen after a long absence.  He denies any long chest pain or any recurrent symptoms of TIA or stroke.  He still has exertional dyspnea.  He is unable to work and remains out of work but still has his insurance through his employer.  He states that he has been compliant with taking his medication.

## 2010-08-03 NOTE — Progress Notes (Signed)
[name] Date of Birth:  05-26-1945 [facility] [address]   [address] [place], [place]  [address] [phone]           Fax   [phone]  History of Present Illness: This pleasant 65 year old gentleman is seen after a long absence.  He has a complex past medical history including ischemic heart disease status post CABG on 05/21/08 by Dr. Laneta Simmers.  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  He has a history of long-standing hypertension and has had several prior strokes.  He continues to smoke against advice.He has not been expressing any recent chest pain.  He expects that he may have taken nitroglycerin one time since his last visit.  The patient has claudication with decreased pulses in his left leg  related to his smoking.   Current Outpatient Prescriptions  Medication Sig Dispense Refill  . aspirin 81 MG tablet Take 81 mg by mouth daily.        Marland Kitchen atorvastatin (LIPITOR) 80 MG tablet Take 80 mg by mouth daily.        Marland Kitchen glimepiride (AMARYL) 4 MG tablet Take 4 mg by mouth daily before breakfast.        . lisinopril-hydrochlorothiazide (PRINZIDE,ZESTORETIC) 20-12.5 MG per tablet Take 1 tablet by mouth daily.        . nitroGLYCERIN (NITROSTAT) 0.4 MG SL tablet Place 0.4 mg under the tongue every 5 (five) minutes as needed.        . pantoprazole (PROTONIX) 40 MG tablet Take 1 tablet (40 mg total) by mouth daily.  90 tablet  3  . ranitidine (ZANTAC) 150 MG tablet Take 150 mg by mouth at bedtime.        Marland Kitchen ZETIA 10 MG tablet TAKE 1 TABLET DAILY  30 tablet  1  . DISCONTD: metoprolol (TOPROL-XL) 50 MG 24 hr tablet Take 50 mg by mouth daily.        . metoprolol (TOPROL XL) 100 MG 24 hr tablet Take 1 tablet (100 mg total) by mouth daily.  30 tablet  11  . DISCONTD: chlorproMAZINE (THORAZINE) 10 MG tablet Take 10 mg by mouth 3 (three) times daily.          Allergies  Allergen Reactions  . Imdur (Isosorbide  Mononitrate)     HEADACHES   . Metformin And Related     ABDOMINAL CRAMPS    Patient Active Problem List  Diagnoses  . DIABETES MELLITUS, TYPE II  . HYPERTENSION  . CAD  . BRADYCARDIA  . VERTEBROBASILAR ARTERY SYNDROME  . TIA  . PSORIASIS  . CARDIAC PACEMAKER IN SITU  . Benign hypertensive heart disease without heart failure  . Hx of CABG  . Intractable hiccoughs  . Tobacco abuse    History  Smoking status  . Current Everyday Smoker -- 1.0 packs/day  . Types: Cigarettes  Smokeless tobacco  . Not on file    History  Alcohol Use No    Family History  Problem Relation Age of Onset  . COPD Mother   . Coronary artery disease Father     Review of Systems: Constitutional: no fever chills diaphoresis or fatigue or change in weight.  Head and neck: no hearing loss, no epistaxis, no photophobia or visual disturbance. Respiratory: No cough, shortness of breath or wheezing. Cardiovascular: No chest pain peripheral edema, palpitations. Gastrointestinal: No abdominal distention, no abdominal pain, no change in bowel habits hematochezia or melena. Genitourinary:  No dysuria, no frequency, no urgency, no nocturia. Musculoskeletal:No arthralgias, no back pain, no gait disturbance or myalgias. Neurological: No dizziness, no headaches, no numbness, no seizures, no syncope, no weakness, no tremors. Hematologic: No lymphadenopathy, no easy bruising. Psychiatric: No confusion, no hallucinations, no sleep disturbance.    Physical Exam: Filed Vitals:   08/03/10 1016  BP: 150/120  Pulse: 80  The general appearance reveals a well-developed well-nourished gentleman in no distress.Pupils equal and reactive.   Extraocular Movements are full.  There is no scleral icterus.  The mouth and pharynx are normal.  The neck is supple.  The carotids reveal no bruits.  The jugular venous pressure is normal.  The thyroid is not enlarged.  There is no lymphadenopathy.The chest is clear to percussion  and auscultation. There are no rales or rhonchi. Expansion of the chest is symmetrical.  The heart reveals a soft systolic ejection murmur at the base.  No diastolic murmur.  No gallop or rub.The abdomen is soft and nontender. Bowel sounds are normal. The liver and spleen are not enlarged. There Are no abdominal masses. There are no bruits.  Extremities reveal no edema.  He has pedal pulses bilaterally but they are weaker in the left foot than the right.The skin is warm and dry.  There is no rash.Strength is normal and symmetrical in all extremities.  There is no lateralizing weakness.  There are no sensory deficits.  EKG today shows left ventricular hypertrophy with strain.  He is in normal sinus rhythm.  Assessment / Plan: Because of poorly controlled blood pressure we are increasing his metoprolol ER to 100 mg daily instead of 50.  Continue other medicines the same.  Advised to stop smoking altogether.  Needs to avoid dietary salt.  Recheck in 3 months for office visit and fasting lab work

## 2010-08-03 NOTE — Assessment & Plan Note (Signed)
The patient continues to smoke a half pack of cigarettes a day.  He denies cough.  We counseled him today on the adverse effects of his smoking in terms of his high blood pressure in his previous strokes and heart disease.  He will try to cut back further

## 2010-08-16 ENCOUNTER — Other Ambulatory Visit: Payer: Self-pay | Admitting: *Deleted

## 2010-08-16 DIAGNOSIS — R066 Hiccough: Secondary | ICD-10-CM

## 2010-08-16 MED ORDER — RANITIDINE HCL 150 MG PO TABS: 150.0000 mg | ORAL_TABLET | Freq: Two times a day (BID) | ORAL | Status: DC

## 2010-08-16 NOTE — Telephone Encounter (Signed)
Faxed refill for ranitidine

## 2010-08-17 ENCOUNTER — Other Ambulatory Visit: Payer: Self-pay | Admitting: Gastroenterology

## 2010-08-18 ENCOUNTER — Ambulatory Visit
Admission: RE | Admit: 2010-08-18 | Discharge: 2010-08-18 | Disposition: A | Discharge status: Home / Self Care | Payer: BC Managed Care – PPO | Source: Ambulatory Visit | Attending: Gastroenterology | Admitting: Gastroenterology

## 2010-09-04 ENCOUNTER — Other Ambulatory Visit: Payer: Self-pay | Admitting: *Deleted

## 2010-09-04 DIAGNOSIS — E119 Type 2 diabetes mellitus without complications: Secondary | ICD-10-CM

## 2010-09-04 MED ORDER — GLIMEPIRIDE 4 MG PO TABS: 4.0000 mg | ORAL_TABLET | Freq: Every day | ORAL | Status: DC

## 2010-09-04 NOTE — Telephone Encounter (Signed)
Refilled meds per fax request.  

## 2010-09-05 ENCOUNTER — Ambulatory Visit (INDEPENDENT_AMBULATORY_CARE_PROVIDER_SITE_OTHER): Payer: Medicare Other | Admitting: Internal Medicine

## 2010-09-05 ENCOUNTER — Encounter: Payer: Self-pay | Admitting: Internal Medicine

## 2010-09-05 DIAGNOSIS — Z950 Presence of cardiac pacemaker: Secondary | ICD-10-CM

## 2010-09-05 DIAGNOSIS — I10 Essential (primary) hypertension: Secondary | ICD-10-CM

## 2010-09-05 DIAGNOSIS — I498 Other specified cardiac arrhythmias: Secondary | ICD-10-CM

## 2010-09-05 DIAGNOSIS — F17200 Nicotine dependence, unspecified, uncomplicated: Secondary | ICD-10-CM

## 2010-09-05 DIAGNOSIS — Z720 Tobacco use: Secondary | ICD-10-CM

## 2010-09-05 LAB — PACEMAKER DEVICE OBSERVATION
AL IMPEDENCE PM: 548 Ohm
AL THRESHOLD: 1 V
ATRIAL PACING PM: 31
RV LEAD IMPEDENCE PM: 613 Ohm

## 2010-09-05 NOTE — Progress Notes (Signed)
HPI [name] returns today for followup. He is a 65 year old man with a history of hypertension which has been difficult to control, symptomatic bradycardia status post pacemaker insertion, dyslipidemia, and a remote stroke. He denies chest pain, shortness of breath, or peripheral edema. He continues to smoke cigarettes at one pack per day. Allergies  Allergen Reactions  . Imdur (Isosorbide Mononitrate)     HEADACHES   . Metformin And Related     ABDOMINAL CRAMPS     Current Outpatient Prescriptions  Medication Sig Dispense Refill  . aspirin 81 MG tablet Take 81 mg by mouth daily.        Marland Kitchen atorvastatin (LIPITOR) 80 MG tablet Take 80 mg by mouth daily.        Marland Kitchen glimepiride (AMARYL) 4 MG tablet Take 1 tablet (4 mg total) by mouth daily before breakfast.  30 tablet  11  . lisinopril-hydrochlorothiazide (PRINZIDE,ZESTORETIC) 20-12.5 MG per tablet Take 1 tablet by mouth daily.        . metoprolol (TOPROL XL) 100 MG 24 hr tablet Take 1 tablet (100 mg total) by mouth daily.  30 tablet  11  . nitroGLYCERIN (NITROSTAT) 0.4 MG SL tablet Place 0.4 mg under the tongue every 5 (five) minutes as needed.        . pantoprazole (PROTONIX) 40 MG tablet Take 1 tablet (40 mg total) by mouth daily.  90 tablet  3  . Probiotic Product (ALIGN PO) Take 1 tablet by mouth daily.        . ranitidine (ZANTAC) 150 MG tablet Take 1 tablet (150 mg total) by mouth 2 (two) times daily.  60 tablet  11  . ZETIA 10 MG tablet TAKE 1 TABLET DAILY  30 tablet  1  . DISCONTD: chlorproMAZINE (THORAZINE) 25 MG tablet Take 1-2 tablets by mouth Three times a day. As needed         Past Medical History  Diagnosis Date  . Cerebellar stroke, acute   . IHD (ischemic heart disease)   . Hiccoughs   . Diabetes mellitus   . Hyperlipidemia   . Vitamin B12 deficiency     ROS:   All systems reviewed and negative except as noted in the HPI.   Past Surgical History  Procedure Date  . Coronary artery bypass graft   . Coronary  angioplasty 09/18/2006    WELL PRESERVED LEFT VENTRICULAR SYSTOLIC FUNCTION. THERE APPEARS TBE SOME HYPOKINESIS OF THE POSTERIOR WALL. EF 60%     Family History  Problem Relation Age of Onset  . COPD Mother   . Coronary artery disease Father      History   Social History  . Marital Status: Single    Spouse Name: N/A    Number of Children: N/A  . Years of Education: N/A   Occupational History  . Not on file.   Social History Main Topics  . Smoking status: Current Everyday Smoker -- 1.0 packs/day    Types: Cigarettes  . Smokeless tobacco: Not on file  . Alcohol Use: No  . Drug Use: No  . Sexually Active: Not on file   Other Topics Concern  . Not on file   Social History Narrative  . No narrative on file     BP 180/98  Pulse 85  Ht 5\' 7"  (1.702 m)  Wt 187 lb 6.4 oz (85.004 kg)  BMI 29.35 kg/m2 BP equals 150/90 on my exam Physical Exam:  Well appearing middle aged man, NAD HEENT: Unremarkable Neck:  No JVD, no thyromegally Lymphatics:  No adenopathy Back:  No CVA tenderness Lungs:  Clear with well-healed pacemaker incision. HEART:  Regular rate rhythm, no murmurs, no rubs, no clicks Abd:  positive bowel sounds, no organomegally, no rebound, no guarding Ext:  2 plus pulses, no edema, no cyanosis, no clubbing Skin:  No rashes no nodules Neuro:  CN II through XII intact, motor grossly intact  DEVICE  Normal device function.  See PaceArt for details.   Assess/Plan:

## 2010-09-05 NOTE — Patient Instructions (Signed)
Follow up with pacer clinic/ Paula/Kristin in 6 months

## 2010-09-05 NOTE — Assessment & Plan Note (Signed)
His device is working normally. Battery longevity approximately 12 years. We'll recheck in several months.

## 2010-09-05 NOTE — Assessment & Plan Note (Signed)
His blood pressure is not well controlled. I discussed the importance of maintaining a low sodium diet with the patient. I've also asked him to exercise. He'll continue his current medical therapy.

## 2010-09-05 NOTE — Assessment & Plan Note (Signed)
I have discussed the importance of stopping smoking with the patient. I've asked him to gradually reduce his tobacco intake.

## 2010-10-09 ENCOUNTER — Other Ambulatory Visit: Payer: Self-pay | Admitting: *Deleted

## 2010-10-09 DIAGNOSIS — E7800 Pure hypercholesterolemia, unspecified: Secondary | ICD-10-CM

## 2010-10-09 DIAGNOSIS — E785 Hyperlipidemia, unspecified: Secondary | ICD-10-CM

## 2010-10-09 MED ORDER — EZETIMIBE 10 MG PO TABS: 10.0000 mg | ORAL_TABLET | Freq: Every day | ORAL | Status: DC

## 2010-10-09 NOTE — Telephone Encounter (Signed)
Faxed refill of zetia to [facility]

## 2010-11-14 ENCOUNTER — Other Ambulatory Visit: Payer: Self-pay | Admitting: Cardiology

## 2010-11-14 DIAGNOSIS — I10 Essential (primary) hypertension: Secondary | ICD-10-CM

## 2010-11-14 DIAGNOSIS — I2510 Atherosclerotic heart disease of native coronary artery without angina pectoris: Secondary | ICD-10-CM

## 2010-11-14 DIAGNOSIS — I498 Other specified cardiac arrhythmias: Secondary | ICD-10-CM

## 2010-11-15 ENCOUNTER — Other Ambulatory Visit (INDEPENDENT_AMBULATORY_CARE_PROVIDER_SITE_OTHER): Payer: Medicare Other | Admitting: *Deleted

## 2010-11-15 ENCOUNTER — Ambulatory Visit (INDEPENDENT_AMBULATORY_CARE_PROVIDER_SITE_OTHER): Payer: Medicare Other | Admitting: Cardiology

## 2010-11-15 ENCOUNTER — Encounter: Payer: Self-pay | Admitting: Cardiology

## 2010-11-15 ENCOUNTER — Other Ambulatory Visit: Payer: Self-pay | Admitting: Cardiology

## 2010-11-15 VITALS — BP 140/90 | HR 60 | Wt 185.0 lb

## 2010-11-15 DIAGNOSIS — I739 Peripheral vascular disease, unspecified: Secondary | ICD-10-CM | POA: Insufficient documentation

## 2010-11-15 DIAGNOSIS — Z951 Presence of aortocoronary bypass graft: Secondary | ICD-10-CM

## 2010-11-15 DIAGNOSIS — Z720 Tobacco use: Secondary | ICD-10-CM

## 2010-11-15 DIAGNOSIS — E7800 Pure hypercholesterolemia, unspecified: Secondary | ICD-10-CM

## 2010-11-15 DIAGNOSIS — Z950 Presence of cardiac pacemaker: Secondary | ICD-10-CM

## 2010-11-15 DIAGNOSIS — E785 Hyperlipidemia, unspecified: Secondary | ICD-10-CM

## 2010-11-15 DIAGNOSIS — R066 Hiccough: Secondary | ICD-10-CM

## 2010-11-15 DIAGNOSIS — I2510 Atherosclerotic heart disease of native coronary artery without angina pectoris: Secondary | ICD-10-CM

## 2010-11-15 DIAGNOSIS — E119 Type 2 diabetes mellitus without complications: Secondary | ICD-10-CM

## 2010-11-15 DIAGNOSIS — I6350 Cerebral infarction due to unspecified occlusion or stenosis of unspecified cerebral artery: Secondary | ICD-10-CM

## 2010-11-15 DIAGNOSIS — I498 Other specified cardiac arrhythmias: Secondary | ICD-10-CM

## 2010-11-15 DIAGNOSIS — I10 Essential (primary) hypertension: Secondary | ICD-10-CM

## 2010-11-15 DIAGNOSIS — I119 Hypertensive heart disease without heart failure: Secondary | ICD-10-CM

## 2010-11-15 DIAGNOSIS — F17200 Nicotine dependence, unspecified, uncomplicated: Secondary | ICD-10-CM

## 2010-11-15 DIAGNOSIS — Z9889 Other specified postprocedural states: Secondary | ICD-10-CM

## 2010-11-15 DIAGNOSIS — I639 Cerebral infarction, unspecified: Secondary | ICD-10-CM

## 2010-11-15 LAB — CBC WITH DIFFERENTIAL/PLATELET
Basophils Relative: 2.1 % (ref 0.0–3.0)
Eosinophils Relative: 3.1 % (ref 0.0–5.0)
Lymphocytes Relative: 15.3 % (ref 12.0–46.0)
Monocytes Relative: 10.9 % (ref 3.0–12.0)
Neutrophils Relative %: 68.6 % (ref 43.0–77.0)
RBC: 4.98 Mil/uL (ref 4.22–5.81)
WBC: 6.9 10*3/uL (ref 4.5–10.5)

## 2010-11-15 LAB — BASIC METABOLIC PANEL
CO2: 32 mEq/L (ref 19–32)
Calcium: 9.4 mg/dL (ref 8.4–10.5)
Glucose, Bld: 192 mg/dL — ABNORMAL HIGH (ref 70–99)
Potassium: 4.2 mEq/L (ref 3.5–5.1)
Sodium: 138 mEq/L (ref 135–145)

## 2010-11-15 LAB — HEPATIC FUNCTION PANEL
ALT: 17 U/L (ref 0–53)
AST: 15 U/L (ref 0–37)
Bilirubin, Direct: 0.2 mg/dL (ref 0.0–0.3)
Total Bilirubin: 0.6 mg/dL (ref 0.3–1.2)

## 2010-11-15 LAB — LIPID PANEL
HDL: 38.4 mg/dL — ABNORMAL LOW (ref >39.00)
VLDL: 70 mg/dL — ABNORMAL HIGH (ref 0.0–40.0)

## 2010-11-15 LAB — LDL CHOLESTEROL, DIRECT: Direct LDL: 108.6 mg/dL

## 2010-11-15 MED ORDER — LISINOPRIL-HYDROCHLOROTHIAZIDE 20-12.5 MG PO TABS: 1.0000 | ORAL_TABLET | Freq: Every day | ORAL | Status: DC

## 2010-11-15 NOTE — Assessment & Plan Note (Signed)
The patient continues to smoke about one pack per day.  Continues to have hiccups about 3 days a week.  We gave him some literature on hiccups today which mentions smoking as a risk factor for echo.  He intends to consider quitting because he is tired of having hiccups and he may quit smoking because of that.

## 2010-11-15 NOTE — Assessment & Plan Note (Signed)
The patient has claudication of his left leg and foot when he walks secondary to decreased arterial pulses in his foot secondary to smoking.

## 2010-11-15 NOTE — Assessment & Plan Note (Signed)
The patient has not been expressing any recurrent angina pectoris.  He has not had ischemic testing following his bypass surgery in 2010.

## 2010-11-15 NOTE — Patient Instructions (Signed)
Hiccups, Hiccoughs Hiccups ( also spelled hiccoughs) are caused by a sudden contraction of the muscles between the ribs and the muscle under your lungs (diaphragm). When you hiccup, the top of your windpipe (glottis) closes immediately after your diaphragm contracts. This makes the typical 'hic' sound. A hiccup is a reflex that you cannot stop. Unlike other reflexes such as coughing and sneezing, hiccups do not seem to have any useful purpose. There are three types of hiccups:    Benign bouts: last less than 48 hours.   Persistent: last more than 48 hours but less than one month.   Intractable: last more than one month.  CAUSES Most people have bouts of hiccups from time to time. They start for no apparent reason, last a short while, and then stop. Sometimes they are due to:  A temporary swollen stomach caused by overeating or eating too fast, drinking fizzy drinks, or swallowing air.   A sudden change in temperature (very hot or cold foods or drinks, a cold shower, etc).   Drinking alcohol.   Excess smoking.   Sudden excitement or emotional stress.  Persistent and intractable hiccups are uncommon. Persistent hiccups can become exhausting and distressing. Persistent hiccups can be caused by an underlying disease. Over 100 diseases have been reported to cause persistent or intractable hiccups. Some are common, such as acid reflux and heartburn, and some are very rare. In many cases, there is no apparent cause. TREATMENT    Most cases need no treatment as a bout of benign hiccups usually doesn't last long.   Medicine is sometimes needed to stop persistent hiccups. Medicine may be given intravenously (IV) or by mouth.   Hypnosis or acupuncture may be suggested.   Surgery to affect the nerve that supplies the diaphragm may be tried in severe cases.   Treatment of an underlying cause is needed in some cases.  HOME CARE INSTRUCTIONS  Popular remedies that may stop a bout of hiccups  include:   Gargling ice water.   Swallowing granulated sugar.   Biting on a lemon.   Holding your breath, breathing fast, or breathing into a paper bag.   Bearing down.   Gasping after a sudden fright.  SEEK MEDICAL CARE IF:  Hiccups last more than 48 hours.   Hiccups that do not get better with medicine.   New symptoms show up.   You cannot sleep or eat due to the hiccups.  Document Released: 04/30/2001 Document Re-Released: 05/18/2008 [facility] Patient Information 2011 [place], [place].Smoking Cessation This document explains the best ways for you to quit smoking and new treatments to help. It lists new medicines that can double or triple your chances of quitting and quitting for good. It also considers ways to avoid relapses and concerns you may have about quitting, including weight gain. NICOTINE: A POWERFUL ADDICTION If you have tried to quit smoking, you know how hard it can be. It is hard because nicotine is a very addictive drug. For some people, it can be as addictive as heroin or cocaine. Usually, people make 2 or 3 tries, or more, before finally being able to quit. Each time you try to quit, you can learn about what helps and what hurts. Quitting takes hard work and a lot of effort, but you can quit smoking. QUITTING SMOKING IS ONE OF THE MOST IMPORTANT THINGS YOU WILL EVER DO:  You will live longer, feel better, and live better.   The impact on your body of quitting smoking is  felt almost immediately:   Within 20 minutes, blood pressure decreases. Pulse returns to its normal level.   After 8 hours, carbon monoxide levels in the blood return to normal. Oxygen level increases.   After 24 hours, chance of heart attack starts to decrease. Breath, hair, and body stop smelling like smoke.   After 48 hours, damaged nerve endings begin to recover. Sense of taste and smell improve.   After 72 hours, the body is virtually free of nicotine. Bronchial tubes relax and breathing  becomes easier.   After 2 to 12 weeks, lungs can hold more air. Exercise becomes easier and circulation improves.   Quitting will lower your chance of having a heart attack, stroke, cancer, or lung disease:   After 1 year, the risk of coronary heart disease is cut in half.   After 5 years, the risk of stroke falls to the same as a nonsmoker.   After 10 years, the risk of lung cancer is cut in half and the risk of other cancers decreases significantly.   After 15 years, the risk of coronary heart disease drops, usually to the level of a nonsmoker.   If you are pregnant, quitting smoking will improve your chances of having a healthy baby.   The people you live with, especially your children, will be healthier.   You will have extra money to spend on things other than cigarettes.  FIVE KEYS TO QUITTING Studies have shown that these 5 steps will help you quit smoking and quit for good. You have the best chances of quitting if you use them together: 1. Get ready.  2. Get support and encouragement.  3. Learn new skills and behaviors.  4. Get medicine to reduce your nicotine addiction and use it correctly.  5. Be prepared for relapse or difficult situations. Be determined to continue trying to quit, even if you do not succeed at first.  1. GET READY  Set a quit date.   Change your environment.   Get rid of ALL cigarettes, ashtrays, matches, and lighters in your home, car, and place of work.   Do not let people smoke in your home.   Review your past attempts to quit. Think about what worked and what did not.   Once you quit, do not smoke. NOT EVEN A PUFF!  2. GET SUPPORT AND ENCOURAGEMENT Studies have shown that you have a better chance of being successful if you have help. You can get support in many ways.  Tell your family, friends, and coworkers that you are going to quit and need their support. Ask them not to smoke around you.   Talk to your caregivers (doctor, dentist,  nurse, pharmacist, psychologist, and/or smoking counselor).   Get individual, group, or telephone counseling and support. The more counseling you have, the better your chances are of quitting. Programs are available at [employer] and health centers. Call your local health department for information about programs in your area.   Spiritual beliefs and practices may help some smokers quit.   Quit meters are Photographer that keep track of quit statistics, such as amount of "quit-time," cigarettes not smoked, and money saved.   Many smokers find one or more of the many self-help books available useful in helping them quit and stay off tobacco.  3. LEARN NEW SKILLS AND BEHAVIORS  Try to distract yourself from urges to smoke. Talk to someone, go for a walk, or occupy your time with  a task.   When you first try to quit, change your routine. Take a different route to work. Drink tea instead of coffee. Eat breakfast in a different place.   Do something to reduce your stress. Take a hot bath, exercise, or read a book.   Plan something enjoyable to do every day. Reward yourself for not smoking.   Explore interactive web-based programs that specialize in helping you quit.  4. GET MEDICINE AND USE IT CORRECTLY Medicines can help you stop smoking and decrease the urge to smoke. Combining medicine with the above behavioral methods and support can quadruple your chances of successfully quitting smoking. The U.S. Food and Drug Administration (FDA) has approved 7 medicines to help you quit smoking. These medicines fall into 3 categories.  Nicotine replacement therapy (delivers nicotine to your body without the negative effects and risks of smoking):   Nicotine gum: Available over-the-counter.   Nicotine lozenges: Available over-the-counter.   Nicotine inhaler: Available by prescription.   Nicotine nasal spray: Available by prescription.   Nicotine skin patches  (transdermal): Available by prescription and over-the-counter.   Antidepressant medicine (helps people abstain from smoking, but how this works is unknown):   Bupropion sustained-release (SR) tablets: Available by prescription.   Nicotinic receptor partial agonist (simulates the effect of nicotine in your brain):   Varenicline tartrate tablets: Available by prescription.   Ask your caregiver for advice about which medicines to use and how to use them. Carefully read the information on the package.   Everyone who is trying to quit may benefit from using a medicine. If you are pregnant or trying to become pregnant, nursing an infant, you are under age 77, or you smoke fewer than 10 cigarettes per day, talk to your caregiver before taking any nicotine replacement medicines.   You should stop using a nicotine replacement product and call your caregiver if you experience nausea, dizziness, weakness, vomiting, fast or irregular heartbeat, mouth problems with the lozenge or gum, or redness or swelling of the skin around the patch that does not go away.   Do not use any other product containing nicotine while using a nicotine replacement product.   Talk to your caregiver before using these products if you have diabetes, heart disease, asthma, stomach ulcers, you had a recent heart attack, you have high blood pressure that is not controlled with medicine, a history of irregular heartbeat, or you have been prescribed medicine to help you quit smoking.  5. BE PREPARED FOR RELAPSE OR DIFFICULT SITUATIONS  Most relapses occur within the first 3 months after quitting. Do not be discouraged if you start smoking again. Remember, most people try several times before they finally quit.   You may have symptoms of withdrawal because your body is used to nicotine. You may crave cigarettes, be irritable, feel very hungry, cough often, get headaches, or have difficulty concentrating.   The withdrawal symptoms are  only temporary. They are strongest when you first quit, but they will go away within 10 to 14 days.  Here are some difficult situations to watch for:  Alcohol. Avoid drinking alcohol. Drinking lowers your chances of successfully quitting.   Caffeine. Try to reduce the amount of caffeine you consume. It also lowers your chances of successfully quitting.   Other smokers. Being around smoking can make you want to smoke. Avoid smokers.   Weight gain. Many smokers will gain weight when they quit, usually less than 10 pounds. Eat a healthy diet and stay active.  Do not let weight gain distract you from your main goal, quitting smoking. Some medicines that help you quit smoking may also help delay weight gain. You can always lose the weight gained after you quit.   Bad mood or depression. There are a lot of ways to improve your mood other than smoking.  If you are having problems with any of these situations, talk to your caregiver. SPECIAL SITUATIONS OR CONDITIONS Studies suggest that everyone can quit smoking. Your situation or condition can give you a special reason to quit.  Pregnant women/New mothers: By quitting, you protect your baby's health and your own.   Hospitalized patients: By quitting, you reduce health problems and help healing.   Heart attack patients: By quitting, you reduce your risk of a second heart attack.   Lung, head, and neck cancer patients: By quitting, you reduce your chance of a second cancer.   Parents of children and adolescents: By quitting, you protect your children from illnesses caused by secondhand smoke.  QUESTIONS TO THINK ABOUT Think about the following questions before you try to stop smoking. You may want to talk about your answers with your caregiver.  Why do you want to quit?   If you tried to quit in the past, what helped and what did not?   What will be the most difficult situations for you after you quit? How will you plan to handle them?   Who  can help you through the tough times? Your family? Friends? Caregiver?   What pleasures do you get from smoking? What ways can you still get pleasure if you quit?  Here are some questions to ask your caregiver:  How can you help me to be successful at quitting?   What medicine do you think would be best for me and how should I take it?   What should I do if I need more help?   What is smoking withdrawal like? How can I get information on withdrawal?  Quitting takes hard work and a lot of effort, but you can quit smoking. FOR MORE INFORMATION Smokefree.gov (http://www.davis-sullivan.com/) provides free, accurate, evidence-based information and professional assistance to help support the immediate and long-term needs of people trying to quit smoking. Document Released: 02/13/2001 Document Re-Released: 08/09/2009 [facility] Patient Information 2011 [place], [place].

## 2010-11-15 NOTE — Progress Notes (Signed)
[name] Date of Birth:  1945-08-04 [facility] [address]   [address] [place], [place]  [address] [phone]           Fax   [phone]  History of Present Illness: This pleasant 65 year old gentleman is seen for a scheduled followup office visit.  He has a complex past medical history.  He has known ischemic heart disease.  He underwent coronary artery bypass graft surgery on 05/21/08 by Dr. Laneta Simmers.  He has a history of long-standing hypertension.  He's had several prior strokes.  He continues to smoke against advice.  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  He's had claudication with decreased pulses in his left leg  Current Outpatient Prescriptions  Medication Sig Dispense Refill  . aspirin 81 MG tablet Take 81 mg by mouth daily.        Marland Kitchen atorvastatin (LIPITOR) 80 MG tablet Take 80 mg by mouth daily.        . chlorproMAZINE (THORAZINE) 25 MG tablet Take 25 mg by mouth 3 (three) times daily. Taking 3 daily       . ezetimibe (ZETIA) 10 MG tablet Take 1 tablet (10 mg total) by mouth daily.  30 tablet  11  . glimepiride (AMARYL) 4 MG tablet Take 1 tablet (4 mg total) by mouth daily before breakfast.  30 tablet  11  . lisinopril-hydrochlorothiazide (PRINZIDE,ZESTORETIC) 20-12.5 MG per tablet Take 1 tablet by mouth daily.  30 tablet  11  . metoprolol (TOPROL XL) 100 MG 24 hr tablet Take 1 tablet (100 mg total) by mouth daily.  30 tablet  11  . nitroGLYCERIN (NITROSTAT) 0.4 MG SL tablet Place 0.4 mg under the tongue every 5 (five) minutes as needed.        . pantoprazole (PROTONIX) 40 MG tablet Take 1 tablet (40 mg total) by mouth daily.  90 tablet  3  . ranitidine (ZANTAC) 150 MG tablet Take 1 tablet (150 mg total) by mouth 2 (two) times daily.  60 tablet  11  . ZETIA 10 MG tablet TAKE 1 TABLET DAILY  30 tablet  11    Allergies  Allergen Reactions  . Imdur (Isosorbide Mononitrate)     HEADACHES   . Metformin And Related     ABDOMINAL CRAMPS    Patient Active Problem List  Diagnoses  . DIABETES MELLITUS, TYPE II  . HYPERTENSION  . CAD  . BRADYCARDIA  . VERTEBROBASILAR ARTERY SYNDROME  . TIA  . PSORIASIS  . CARDIAC PACEMAKER IN SITU  . Benign hypertensive heart disease without heart failure  . Hx of CABG  . Intractable hiccoughs  . Tobacco abuse  . Pacemaker    History  Smoking status  . Current Everyday Smoker -- 1.0 packs/day  . Types: Cigarettes  Smokeless tobacco  . Not on file    History  Alcohol Use No    Family History  Problem Relation Age of Onset  . COPD Mother   . Coronary artery disease Father     Review of Systems: Constitutional: no fever chills diaphoresis or fatigue or change in weight.  Head and neck: no hearing loss, no epistaxis, no photophobia or visual disturbance. Respiratory: No cough, shortness of breath or wheezing. Cardiovascular: No chest pain peripheral edema, palpitations. Gastrointestinal: No abdominal distention, no abdominal pain, no change in bowel habits hematochezia or melena. Genitourinary: No dysuria, no frequency, no urgency, no nocturia. Musculoskeletal:No arthralgias, no  back pain, no gait disturbance or myalgias. Neurological: No dizziness, no headaches, no numbness, no seizures, no syncope, no weakness, no tremors. Hematologic: No lymphadenopathy, no easy bruising. Psychiatric: No confusion, no hallucinations, no sleep disturbance.    Physical Exam: Filed Vitals:   11/15/10 0858  BP: 140/90  Pulse: 60  The general Appearance reveals a well-developed well-nourished middle-aged gentleman in no distress.  Head and neck reveals soft bilateral carotid bruits.  No jugular venous distention.  Pupils are equal and reactive.  No scleral icterus.  We'll no lymphadenopathy. The chest reveals coarse rhonchi bilaterally.  The heart reveals a grade 2/6 systolic ejection murmur at the base.  No diastolic murmur.  No gallop  or rub. The abdomen is soft and nontender. Bowel sounds are normal. The liver and spleen are not enlarged. There Are no abdominal masses. There are no bruits.  Extremities reveal decreased pedal pulses on the left.  No phlebitis.  Neurologic exam reveals good strength bilaterally.  The patient is not experiencing hiccups today.The skin is warm and dry.  There is no rash.     Assessment / Plan: We talked about ways of smoking cessation.  He will continue same medication.  Blood work today is pending.  Recheck in 4 months for followup office visit and lab work.

## 2010-11-15 NOTE — Assessment & Plan Note (Signed)
The patient has had a history of previous symptomatic bradycardia and has a functioning dual-chamber pacemaker

## 2010-11-15 NOTE — Assessment & Plan Note (Signed)
The patient has not been expressing any new symptoms referable to his blood pressure.  He has not had any further strokes.

## 2010-11-16 ENCOUNTER — Telehealth: Payer: Self-pay | Admitting: *Deleted

## 2010-11-16 NOTE — Telephone Encounter (Signed)
Advised of labs 

## 2010-11-16 NOTE — Progress Notes (Signed)
Advised patient

## 2010-11-16 NOTE — Telephone Encounter (Signed)
Message copied by Burnell Blanks on Thu Nov 16, 2010  2:58 PM ------      Message from: [name]      Created: Wed Nov 15, 2010  9:25 PM       Please report.  The kidney function is normal.  The blood sugar was slightly better at 192.Her cholesterol is much better but the triglycerides remain elevated at 350.The white count and hemoglobin are normal.The liver tests are all normal.Continue same medication and continue careful diet.

## 2010-11-30 ENCOUNTER — Other Ambulatory Visit: Payer: Self-pay | Admitting: *Deleted

## 2010-11-30 DIAGNOSIS — I119 Hypertensive heart disease without heart failure: Secondary | ICD-10-CM

## 2010-11-30 DIAGNOSIS — I10 Essential (primary) hypertension: Secondary | ICD-10-CM

## 2010-11-30 LAB — BASIC METABOLIC PANEL WITH GFR
BUN: 8
CO2: 26
Calcium: 8.9
Creatinine, Ser: 1.22
GFR calc non Af Amer: >60
Glucose, Bld: 114 — ABNORMAL HIGH

## 2010-11-30 LAB — DIFFERENTIAL
Basophils Absolute: 0
Basophils Relative: 1
Basophils Relative: 1
Eosinophils Absolute: 0.1
Eosinophils Absolute: 0
Eosinophils Relative: 2
Lymphocytes Relative: 23
Lymphs Abs: 1.4
Monocytes Absolute: 0.5
Monocytes Absolute: 0.8
Monocytes Relative: 8
Monocytes Relative: 8
Neutro Abs: 4
Neutrophils Relative %: 66
Neutrophils Relative %: 80 — ABNORMAL HIGH

## 2010-11-30 LAB — CBC
HCT: 46
HCT: 47.8
Hemoglobin: 15.9
Hemoglobin: 16.4
MCHC: 34.3
MCHC: 34.5
MCHC: 34.4
MCV: 91.3
MCV: 91.7
MCV: 91.5
Platelets: 199
Platelets: 228
RBC: 5.03
RBC: 5.22
RBC: 5.48
RDW: 14
RDW: 14.1
WBC: 10.8 — ABNORMAL HIGH
WBC: 6.1

## 2010-11-30 LAB — COMPREHENSIVE METABOLIC PANEL
ALT: 15
AST: 16
Albumin: 3.6
Alkaline Phosphatase: 83
BUN: 8
Chloride: 106
GFR calc Af Amer: >60
Potassium: 3.5
Sodium: 139
Total Bilirubin: 0.5
Total Protein: 6.1

## 2010-11-30 LAB — RAPID URINE DRUG SCREEN, HOSP PERFORMED
Benzodiazepines: NOT DETECTED
Cocaine: NOT DETECTED
Opiates: NOT DETECTED

## 2010-11-30 LAB — COMPREHENSIVE METABOLIC PANEL WITH GFR
CO2: 24
Calcium: 9
Creatinine, Ser: 1.13
GFR calc non Af Amer: >60
Glucose, Bld: 121 — ABNORMAL HIGH

## 2010-11-30 LAB — BASIC METABOLIC PANEL
BUN: 7
BUN: 10
CO2: 23
CO2: 21
Chloride: 99
Chloride: 104
Creatinine, Ser: 1.11
GFR calc Af Amer: >60
GFR calc Af Amer: >60
Glucose, Bld: 136 — ABNORMAL HIGH
Glucose, Bld: 130 — ABNORMAL HIGH
Potassium: 3.1 — ABNORMAL LOW
Potassium: 3.1 — ABNORMAL LOW
Sodium: 133 — ABNORMAL LOW
Sodium: 135

## 2010-11-30 LAB — MAGNESIUM: Magnesium: 1.8

## 2010-11-30 LAB — CK TOTAL AND CKMB (NOT AT ARMC)
CK, MB: 2.4
CK, MB: 3.3
Relative Index: INVALID
Total CK: 43

## 2010-11-30 LAB — LIPID PANEL
Cholesterol: 303 — ABNORMAL HIGH
HDL: 35 — ABNORMAL LOW
LDL Cholesterol: 231 — ABNORMAL HIGH

## 2010-11-30 LAB — PHOSPHORUS: Phosphorus: 3.1

## 2010-11-30 LAB — PROTIME-INR
INR: 0.9
Prothrombin Time: 12.2

## 2010-11-30 LAB — CALCIUM: Calcium: 8.5

## 2010-11-30 LAB — POCT CARDIAC MARKERS
CKMB, poc: 2.5
Myoglobin, poc: 47.6
Operator id: 234501
Troponin i, poc: 0.08 — ABNORMAL HIGH

## 2010-11-30 LAB — TROPONIN I
Troponin I: 0.11 — ABNORMAL HIGH
Troponin I: 0.11 — ABNORMAL HIGH
Troponin I: 0.17 — ABNORMAL HIGH

## 2010-11-30 LAB — HEMOGLOBIN A1C
Hgb A1c MFr Bld: 6.4 — ABNORMAL HIGH
Mean Plasma Glucose: 151

## 2010-11-30 LAB — TSH: TSH: 0.567

## 2010-11-30 LAB — APTT: aPTT: 22 — ABNORMAL LOW

## 2010-11-30 MED ORDER — LISINOPRIL-HYDROCHLOROTHIAZIDE 20-12.5 MG PO TABS: 1.0000 | ORAL_TABLET | Freq: Every day | ORAL | Status: DC

## 2010-11-30 NOTE — Telephone Encounter (Signed)
Refilled lisinopril/hctz

## 2010-12-26 ENCOUNTER — Telehealth: Payer: Self-pay | Admitting: Internal Medicine

## 2010-12-26 NOTE — Telephone Encounter (Signed)
Do you know of any medication that can be Rx'd for this

## 2010-12-26 NOTE — Telephone Encounter (Signed)
Left message

## 2010-12-26 NOTE — Telephone Encounter (Signed)
Pt wife calling wanting to know what the name of the medication Dr. Ladona Ridgel recommended pt take for hiccups. Please return pt call to discuss further  Or you can contact pt on cell phone at: [phone]

## 2010-12-26 NOTE — Telephone Encounter (Signed)
Thorazine?

## 2010-12-27 ENCOUNTER — Telehealth: Payer: Self-pay | Admitting: Cardiology

## 2010-12-27 ENCOUNTER — Other Ambulatory Visit: Payer: Self-pay | Admitting: *Deleted

## 2010-12-27 NOTE — Telephone Encounter (Signed)
Error

## 2010-12-27 NOTE — Telephone Encounter (Signed)
Friend has returned call from last night.

## 2010-12-27 NOTE — Telephone Encounter (Signed)
Pt returning your call

## 2010-12-27 NOTE — Telephone Encounter (Deleted)
Pt returning your call

## 2010-12-27 NOTE — Telephone Encounter (Signed)
Spoke with patient and he is taking Thorazine 25 mg 3 a bedtime already. Stated he gets hiccups after every meal. Advised to try taking three times a day and see if that helps.  They also wanted another GI recommendation.  Gave them another office to try.

## 2011-03-09 ENCOUNTER — Other Ambulatory Visit: Payer: Medicare Other | Admitting: *Deleted

## 2011-03-09 ENCOUNTER — Ambulatory Visit (INDEPENDENT_AMBULATORY_CARE_PROVIDER_SITE_OTHER): Payer: Medicare Other | Admitting: Cardiology

## 2011-03-09 ENCOUNTER — Encounter: Payer: Self-pay | Admitting: Cardiology

## 2011-03-09 VITALS — BP 120/84 | HR 80 | Ht 66.0 in | Wt 174.0 lb

## 2011-03-09 DIAGNOSIS — E7800 Pure hypercholesterolemia, unspecified: Secondary | ICD-10-CM

## 2011-03-09 DIAGNOSIS — F17200 Nicotine dependence, unspecified, uncomplicated: Secondary | ICD-10-CM

## 2011-03-09 DIAGNOSIS — Z951 Presence of aortocoronary bypass graft: Secondary | ICD-10-CM

## 2011-03-09 DIAGNOSIS — I119 Hypertensive heart disease without heart failure: Secondary | ICD-10-CM

## 2011-03-09 DIAGNOSIS — Z720 Tobacco use: Secondary | ICD-10-CM

## 2011-03-09 DIAGNOSIS — R066 Hiccough: Secondary | ICD-10-CM

## 2011-03-09 DIAGNOSIS — Z9889 Other specified postprocedural states: Secondary | ICD-10-CM

## 2011-03-09 NOTE — Assessment & Plan Note (Signed)
The patient still has prolonged hiccoughs but they are less severe since he has been taking Thorazine at the suggestion of his gastroenterologist Dr. Loreta Ave.

## 2011-03-09 NOTE — Assessment & Plan Note (Signed)
The patient continues to smoke but is smoking less than previously.  We talked with him about the importance of quitting altogether.  He has not had any recent cough or bronchitis.  3 weeks ago he had the influenza with cough.  He had not taken the flu shot and has never taken it.  I suggested that next time he takes the flu shot

## 2011-03-09 NOTE — Progress Notes (Signed)
[name] Date of Birth:  Jan 20, 1946 [facility] / [facility] [address]   [address] [place], [place]  [address] [phone]           Fax   [phone]  History of Present Illness: This pleasant 66 year old gentleman is seen for a scheduled four-month followup office visit.  He has a past history of ischemic heart disease.  He had coronary artery bypass graft surgery on 05/21/08.  He said a history of long-standing hypertension.  He's had prior strokes.  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  Continues to smoke against advice and he does have claudication and peripheral arterial occlusive disease.  Current Outpatient Prescriptions  Medication Sig Dispense Refill  . aspirin 81 MG tablet Take 81 mg by mouth daily.        Marland Kitchen atorvastatin (LIPITOR) 80 MG tablet Take 80 mg by mouth daily.        . baclofen (LIORESAL) 10 MG tablet Take 10 mg by mouth Daily.      . chlorproMAZINE (THORAZINE) 25 MG tablet Take 25 mg by mouth 3 (three) times daily. Taking 3 daily       . ezetimibe (ZETIA) 10 MG tablet Take 1 tablet (10 mg total) by mouth daily.  30 tablet  11  . glimepiride (AMARYL) 4 MG tablet Take 1 tablet (4 mg total) by mouth daily before breakfast.  30 tablet  11  . lisinopril-hydrochlorothiazide (PRINZIDE,ZESTORETIC) 20-12.5 MG per tablet Take 1 tablet by mouth daily.  30 tablet  11  . metoprolol (TOPROL XL) 100 MG 24 hr tablet Take 1 tablet (100 mg total) by mouth daily.  30 tablet  11  . nitroGLYCERIN (NITROSTAT) 0.4 MG SL tablet Place 0.4 mg under the tongue every 5 (five) minutes as needed.        . pantoprazole (PROTONIX) 40 MG tablet Take 1 tablet (40 mg total) by mouth daily.  90 tablet  3  . ranitidine (ZANTAC) 150 MG tablet Take 1 tablet (150 mg total) by mouth 2 (two) times daily.  60 tablet  11    Allergies  Allergen Reactions  . Imdur (Isosorbide Mononitrate)     HEADACHES   . Metformin And Related    ABDOMINAL CRAMPS    Patient Active Problem List  Diagnoses  . DIABETES MELLITUS, TYPE II  . HYPERTENSION  . CAD  . BRADYCARDIA  . VERTEBROBASILAR ARTERY SYNDROME  . TIA  . PSORIASIS  . CARDIAC PACEMAKER IN SITU  . Benign hypertensive heart disease without heart failure  . Hx of CABG  . Intractable hiccoughs  . Tobacco abuse  . Pacemaker  . Vascular claudication    History  Smoking status  . Current Everyday Smoker -- 1.0 packs/day  . Types: Cigarettes  Smokeless tobacco  . Not on file    History  Alcohol Use No    Family History  Problem Relation Age of Onset  . COPD Mother   . Coronary artery disease Father     Review of Systems: Constitutional: no fever chills diaphoresis or fatigue or change in weight.  Head and neck: no hearing loss, no epistaxis, no photophobia or visual disturbance. Respiratory: No cough, shortness of breath or wheezing. Cardiovascular: No chest pain peripheral edema, palpitations. Gastrointestinal: No abdominal distention, no abdominal pain, no change in bowel habits hematochezia or melena. Genitourinary: No dysuria, no frequency, no urgency, no nocturia. Musculoskeletal:No arthralgias, no back pain, no gait disturbance  or myalgias. Neurological: No dizziness, no headaches, no numbness, no seizures, no syncope, no weakness, no tremors. Hematologic: No lymphadenopathy, no easy bruising. Psychiatric: No confusion, no hallucinations, no sleep disturbance.    Physical Exam: Filed Vitals:   03/09/11 1137  BP: 120/84  Pulse: 80   the general appearance reveals a well-developed well-nourished gentleman in no distress.  Pupils equal and reactive.   Extraocular Movements are full.  There is no scleral icterus.  The mouth and pharynx are normal.  The neck is supple.  The carotids reveal no bruits.  The jugular venous pressure is normal.  The thyroid is not enlarged.  There is no lymphadenopathy.  The chest is clear to percussion and  auscultation. There are no rales or rhonchi. Expansion of the chest is symmetrical.  The precordium is quiet.  The first heart sound is normal.  The second heart sound is physiologically split.  There is no murmur gallop rub or click.  There is no abnormal lift or heave.  The abdomen is soft and nontender. Bowel sounds are normal. The liver and spleen are not enlarged. There Are no abdominal masses. There are no bruits.  Extremities reveal diminished pedal pulses and no phlebitis or edema.Strength is normal and symmetrical in all extremities.  There is no lateralizing weakness.  There are no sensory deficits.  Integument reveals no rash   Assessment / Plan: Continue same medication.  Advised to quit smoking altogether because of his peripheral arterial occlusive disease.  Recheck in 4 months for office visit EKG and fasting lab work.  Blood work today pending.

## 2011-03-09 NOTE — Patient Instructions (Signed)
Your physician recommends that you continue on your current medications as directed. Please refer to the Current Medication list given to you today. Your physician wants you to follow-up in: 4 months You will receive a reminder letter in the mail two months in advance. If you don't receive a letter, please call our office to schedule the follow-up appointment.  

## 2011-03-09 NOTE — Assessment & Plan Note (Signed)
The patient's blood pressure has been remaining stable on current therapy.  He is having no dizziness or headaches.  He's had no TIA symptoms.

## 2011-03-09 NOTE — Assessment & Plan Note (Signed)
The patient has not been expressing any recent chest pain or angina.  He has not had to take any nitroglycerin.

## 2011-06-11 ENCOUNTER — Telehealth: Payer: Self-pay | Admitting: Cardiology

## 2011-06-11 NOTE — Telephone Encounter (Signed)
Left message to call back  

## 2011-06-11 NOTE — Telephone Encounter (Signed)
New Problem:     Patient would like to receive a referral for a new gastroenterologists.  Please call back.

## 2011-06-12 NOTE — Telephone Encounter (Signed)
Left message

## 2011-06-12 NOTE — Telephone Encounter (Signed)
F/U  Patient wife Junious Dresser returning nurse call, she can be reached at [phone].

## 2011-06-22 NOTE — Telephone Encounter (Signed)
Never called back

## 2011-07-16 ENCOUNTER — Ambulatory Visit: Payer: Medicare Other | Admitting: Cardiology

## 2011-07-16 ENCOUNTER — Telehealth: Payer: Self-pay | Admitting: Cardiology

## 2011-07-16 NOTE — Telephone Encounter (Signed)
Spoke with [name] and she stated patient is having hiccups so bad it is causing him to throw up.  He has been seeing GI MD for this but doesn't seem to be getting better.  Did advise that he should contact Dr. Loreta Ave (GI) and let them know he is worse.  Did give some names for GI as requested and she stated they had tried to get in but they had to have all records.  Advised that was normal to have to get all records before seeing someone else since insurance may not pay for duplicate testing.  She also wanted to know if  Dr. Patty Sermons could fill his insulin.  Advised that our records did not show him taking insulin and that he may need to go to urgent care or emergency room to be seen.  Patient was scheduled to be seen today by  Dr. Patty Sermons but [name] had to cancel since he was so sick.  Tried to call patient and advise him as requested by [name].  Had to leave message to call back.

## 2011-07-16 NOTE — Telephone Encounter (Signed)
Left message to call back, again 

## 2011-07-16 NOTE — Telephone Encounter (Signed)
Please return call to patient friend connie at [phone] or [phone]   Patient wants a new referral for GI doctor, please return call to patient friend.

## 2011-07-17 NOTE — Telephone Encounter (Signed)
Patient never called back.

## 2011-08-07 ENCOUNTER — Other Ambulatory Visit: Payer: Medicare Other

## 2011-08-07 ENCOUNTER — Ambulatory Visit: Payer: Medicare Other | Admitting: Cardiology

## 2011-09-05 ENCOUNTER — Encounter: Payer: Self-pay | Admitting: Cardiology

## 2011-09-05 ENCOUNTER — Ambulatory Visit (INDEPENDENT_AMBULATORY_CARE_PROVIDER_SITE_OTHER): Payer: Medicare Other | Admitting: Cardiology

## 2011-09-05 ENCOUNTER — Other Ambulatory Visit (INDEPENDENT_AMBULATORY_CARE_PROVIDER_SITE_OTHER): Payer: Medicare Other

## 2011-09-05 VITALS — BP 140/98 | HR 89 | Ht 66.0 in | Wt 176.0 lb

## 2011-09-05 DIAGNOSIS — R066 Hiccough: Secondary | ICD-10-CM

## 2011-09-05 DIAGNOSIS — I119 Hypertensive heart disease without heart failure: Secondary | ICD-10-CM

## 2011-09-05 DIAGNOSIS — Z950 Presence of cardiac pacemaker: Secondary | ICD-10-CM

## 2011-09-05 DIAGNOSIS — I639 Cerebral infarction, unspecified: Secondary | ICD-10-CM

## 2011-09-05 DIAGNOSIS — E785 Hyperlipidemia, unspecified: Secondary | ICD-10-CM

## 2011-09-05 DIAGNOSIS — Z720 Tobacco use: Secondary | ICD-10-CM

## 2011-09-05 DIAGNOSIS — R0989 Other specified symptoms and signs involving the circulatory and respiratory systems: Secondary | ICD-10-CM

## 2011-09-05 DIAGNOSIS — I6350 Cerebral infarction due to unspecified occlusion or stenosis of unspecified cerebral artery: Secondary | ICD-10-CM

## 2011-09-05 DIAGNOSIS — I2510 Atherosclerotic heart disease of native coronary artery without angina pectoris: Secondary | ICD-10-CM

## 2011-09-05 DIAGNOSIS — F17200 Nicotine dependence, unspecified, uncomplicated: Secondary | ICD-10-CM

## 2011-09-05 HISTORY — DX: Hyperlipidemia, unspecified: E78.5

## 2011-09-05 LAB — BASIC METABOLIC PANEL
BUN: 9 mg/dL (ref 6–23)
Calcium: 9.1 mg/dL (ref 8.4–10.5)
Chloride: 101 mEq/L (ref 96–112)
Creatinine, Ser: 1.4 mg/dL (ref 0.4–1.5)

## 2011-09-05 LAB — LIPID PANEL
Cholesterol: 280 mg/dL — ABNORMAL HIGH (ref 0–200)
VLDL: 54 mg/dL — ABNORMAL HIGH (ref 0.0–40.0)

## 2011-09-05 LAB — HEPATIC FUNCTION PANEL
ALT: 10 U/L (ref 0–53)
AST: 10 U/L (ref 0–37)
Alkaline Phosphatase: 84 U/L (ref 39–117)
Bilirubin, Direct: 0 mg/dL (ref 0.0–0.3)
Total Protein: 7.4 g/dL (ref 6.0–8.3)

## 2011-09-05 LAB — HEMOGLOBIN A1C: Hgb A1c MFr Bld: 7.6 % — ABNORMAL HIGH (ref 4.6–6.5)

## 2011-09-05 NOTE — Progress Notes (Signed)
Quick Note:  Please report to patient. The recent labs are stable. Continue same medication and careful diet. The diabetes is better. The A1c is better. ______

## 2011-09-05 NOTE — Progress Notes (Signed)
[name] Date of Birth:  [birth year] [facility] [address] [place], [place]  [address] [phone]         Fax   [phone]  History of Present Illness: This pleasant 66 year old gentleman is seen for a four-month followup office visit.  He has a past history of ischemic heart disease.  He underwent coronary artery bypass graft surgery on 05/21/08.  He has had prior strokes.  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  He's had long-standing high blood pressure.  He has claudication of the legs and peripheral arterial occlusive disease and he continues to smoke a half to a full pack of cigarettes a day against advice.  Current Outpatient Prescriptions  Medication Sig Dispense Refill  . aspirin 81 MG tablet Take 81 mg by mouth daily.        . baclofen (LIORESAL) 10 MG tablet Take 10 mg by mouth Daily.      . chlorproMAZINE (THORAZINE) 25 MG tablet Take 25 mg by mouth 3 (three) times daily. Taking 3 daily       . dexlansoprazole (DEXILANT) 60 MG capsule Take 60 mg by mouth daily.      Marland Kitchen ezetimibe (ZETIA) 10 MG tablet Take 1 tablet (10 mg total) by mouth daily.  30 tablet  11  . glimepiride (AMARYL) 4 MG tablet Take 1 tablet (4 mg total) by mouth daily before breakfast.  30 tablet  11  . lisinopril-hydrochlorothiazide (PRINZIDE,ZESTORETIC) 20-12.5 MG per tablet Take 1 tablet by mouth daily.  30 tablet  11  . metoprolol succinate (TOPROL-XL) 100 MG 24 hr tablet Take 100 mg by mouth daily. Take with or immediately following a meal.      . nitroGLYCERIN (NITROSTAT) 0.4 MG SL tablet Place 0.4 mg under the tongue every 5 (five) minutes as needed.        . ranitidine (ZANTAC) 150 MG tablet Take 1 tablet (150 mg total) by mouth 2 (two) times daily.  60 tablet  11  . metoprolol (TOPROL XL) 100 MG 24 hr tablet Take 1 tablet (100 mg total) by mouth daily.  30 tablet  11  . pantoprazole (PROTONIX) 40 MG  tablet Take 1 tablet (40 mg total) by mouth daily.  90 tablet  3    Allergies  Allergen Reactions  . Imdur (Isosorbide Mononitrate)     HEADACHES   . Metformin And Related     ABDOMINAL CRAMPS    Patient Active Problem List  Diagnosis  . DIABETES MELLITUS, TYPE II  . HYPERTENSION  . CAD  . BRADYCARDIA  . VERTEBROBASILAR ARTERY SYNDROME  . TIA  . PSORIASIS  . CARDIAC PACEMAKER IN SITU  . Benign hypertensive heart disease without heart failure  . Hx of CABG  . Intractable hiccoughs  . Tobacco abuse  . Pacemaker  . Vascular claudication    History  Smoking status  . Current Everyday Smoker -- 1.0 packs/day  . Types: Cigarettes  Smokeless tobacco  . Not on file    History  Alcohol Use No    Family History  Problem Relation Age of Onset  . COPD Mother   . Coronary artery disease Father     Review of Systems: Constitutional: no fever chills diaphoresis or fatigue or change in weight.  Head and neck: no hearing loss, no epistaxis, no photophobia or visual disturbance. Respiratory: No cough, shortness of breath or wheezing. Cardiovascular: No chest  pain peripheral edema, palpitations. Gastrointestinal: No abdominal distention, no abdominal pain, no change in bowel habits hematochezia or melena. Genitourinary: No dysuria, no frequency, no urgency, no nocturia. Musculoskeletal:No arthralgias, no back pain, no gait disturbance or myalgias. Neurological: No dizziness, no headaches, no numbness, no seizures, no syncope, no weakness, no tremors. Hematologic: No lymphadenopathy, no easy bruising. Psychiatric: No confusion, no hallucinations, no sleep disturbance.    Physical Exam: Filed Vitals:   09/05/11 0850  BP: 140/98  Pulse: 89   the general appearance reveals a well-developed gentleman who is appearing older than his stated age.  He is still having ongoing hiccups.  Head and neck revealed bilateral carotid bruits.  Chest reveals bilateral rhonchi.  Heart  reveals a grade 2/6 systolic ejection murmur at the base.  Abdomen is soft and nontender without hepatomegaly or splenomegaly.  Bowel sounds are present. Extremities show equivocal pedal pulses.  There is no phlebitis or edema. Integument reveals widespread psoriasis   Assessment / Plan:  The patient needs to be more compliant with his medication and diet.  Work today pending.  We will also get a hemoglobin A1c to judge the degree of diabetic control.  He'll be rechecked in 4 months for followup office visit and fasting lab work.  He mentioned that Dr. Oliver Barre is his new primary care provider.

## 2011-09-05 NOTE — Patient Instructions (Addendum)
Will obtain labs today and call you with the results  Your physician recommends that you continue on your current medications as directed. Please refer to the Current Medication list given to you today.  Your physician recommends that you schedule a follow-up appointment in: 4 months with fasting labs (lp/bmet/hfp)  

## 2011-09-05 NOTE — Assessment & Plan Note (Signed)
The patient denies any headaches or dizziness.  No syncope.

## 2011-09-05 NOTE — Assessment & Plan Note (Signed)
Patient has not been aware of any palpitations or tachycardia.  He's had no dizzy spells or Stokes-Adams attacks

## 2011-09-05 NOTE — Assessment & Plan Note (Signed)
The patient has a history of dyslipidemia.  He takes atorvastatin and ezetimibe.  He has not been compliant with his medication however it is not on a careful diet

## 2011-09-05 NOTE — Assessment & Plan Note (Signed)
Patient continues to smoke against advice despite his history of widespread vascular disease.  We talked again about the importance of quitting smoking.  He does not appear motivated to quit however

## 2011-09-12 ENCOUNTER — Telehealth: Payer: Self-pay | Admitting: *Deleted

## 2011-09-12 NOTE — Telephone Encounter (Signed)
Advised patient of lab results  

## 2011-09-12 NOTE — Telephone Encounter (Signed)
Message copied by Burnell Blanks on Wed Sep 12, 2011 12:45 PM ------      Message from: [name]      Created: Wed Sep 05, 2011  6:01 PM       Please report to patient.  The recent labs are stable. Continue same medication and careful diet.  The diabetes is better.  The A1c is better.

## 2011-09-30 ENCOUNTER — Other Ambulatory Visit: Payer: Self-pay | Admitting: Cardiology

## 2011-10-12 ENCOUNTER — Encounter: Payer: Medicare Other | Admitting: Internal Medicine

## 2011-11-06 ENCOUNTER — Other Ambulatory Visit: Payer: Self-pay | Admitting: Internal Medicine

## 2011-11-06 ENCOUNTER — Encounter: Payer: Self-pay | Admitting: Internal Medicine

## 2011-11-06 ENCOUNTER — Ambulatory Visit (INDEPENDENT_AMBULATORY_CARE_PROVIDER_SITE_OTHER): Payer: Medicare Other | Admitting: Internal Medicine

## 2011-11-06 VITALS — BP 132/82 | HR 100 | Temp 98.3°F | Ht 66.0 in | Wt 172.8 lb

## 2011-11-06 DIAGNOSIS — R066 Hiccough: Secondary | ICD-10-CM

## 2011-11-06 DIAGNOSIS — E119 Type 2 diabetes mellitus without complications: Secondary | ICD-10-CM

## 2011-11-06 DIAGNOSIS — Z0000 Encounter for general adult medical examination without abnormal findings: Secondary | ICD-10-CM | POA: Insufficient documentation

## 2011-11-06 DIAGNOSIS — I10 Essential (primary) hypertension: Secondary | ICD-10-CM

## 2011-11-06 DIAGNOSIS — E785 Hyperlipidemia, unspecified: Secondary | ICD-10-CM

## 2011-11-06 DIAGNOSIS — I6529 Occlusion and stenosis of unspecified carotid artery: Secondary | ICD-10-CM | POA: Insufficient documentation

## 2011-11-06 DIAGNOSIS — N200 Calculus of kidney: Secondary | ICD-10-CM

## 2011-11-06 DIAGNOSIS — K219 Gastro-esophageal reflux disease without esophagitis: Secondary | ICD-10-CM

## 2011-11-06 HISTORY — DX: Gastro-esophageal reflux disease without esophagitis: K21.9

## 2011-11-06 HISTORY — DX: Occlusion and stenosis of unspecified carotid artery: I65.29

## 2011-11-06 HISTORY — DX: Calculus of kidney: N20.0

## 2011-11-06 MED ORDER — CHLORPROMAZINE HCL 25 MG PO TABS: 25.0000 mg | ORAL_TABLET | Freq: Three times a day (TID) | ORAL | Status: DC

## 2011-11-06 MED ORDER — ATORVASTATIN CALCIUM 40 MG PO TABS: 40.0000 mg | ORAL_TABLET | Freq: Every day | ORAL | Status: DC

## 2011-11-06 MED ORDER — DEXLANSOPRAZOLE 60 MG PO CPDR: 60.0000 mg | DELAYED_RELEASE_CAPSULE | Freq: Every day | ORAL | Status: DC

## 2011-11-06 NOTE — Assessment & Plan Note (Signed)
stable overall by hx and exam, most recent data reviewed with pt, and pt to continue medical treatment as before Lab Results  Component Value Date   LDLCALC  Value: UNABLE TO CALCULATE IF TRIGLYCERIDE OVER 400 mg/dL        Total Cholesterol/HDL:CHD Risk Coronary Heart Disease Risk Table                     Men   Women  1/2 Average Risk   3.4   3.3  Average Risk       5.0   4.4  2 X Average Risk   9.6   7.1  3 X Average Risk  23.4   11.0        Use the calculated Patient Ratio above and the CHD Risk Table to determine the patient's CHD Risk.        ATP III CLASSIFICATION (LDL):  <100     mg/dL   Optimal  409-811  mg/dL   Near or Above                    Optimal  130-159  mg/dL   Borderline  914-782  mg/dL   High  >956     mg/dL   Very High 04/18/863   Except to start lipitor 40, goal ldl < 70

## 2011-11-06 NOTE — Assessment & Plan Note (Signed)
stable overall by hx and exam, most recent data reviewed with pt, and pt to continue medical treatment as before Lab Results  Component Value Date   HGBA1C 7.6* 09/05/2011    

## 2011-11-06 NOTE — Patient Instructions (Addendum)
Take all new medications as prescribed - the lipitor 40 mg Increase the thorazine to 25 mg three times per day (for at least 10 days) Continue all other medications as before Your refill was done as requested Please have the pharmacy call with any refills you may need. You will be contacted regarding the referral for: carotid doppler ultrasound Please keep your appointments with your specialists as you have planned - Dr Loreta Ave and cardiology No blood work today, but we should next visit Please return in 3 months

## 2011-11-06 NOTE — Assessment & Plan Note (Signed)
Due for f/u dopplers, last in 2010

## 2011-11-06 NOTE — Assessment & Plan Note (Signed)
Only taking once nightly - ok for tid dosing, hopefully short term

## 2011-11-06 NOTE — Assessment & Plan Note (Signed)
stable overall by hx and exam, most recent data reviewed with pt, and pt to continue medical treatment as before BP Readings from Last 3 Encounters:  11/06/11 132/82  09/05/11 140/98  03/09/11 120/84

## 2011-11-06 NOTE — Progress Notes (Signed)
Subjective:    Patient ID: [name], male    DOB: April 06, 1945, 66 y.o.   MRN: [id number]  HPI  Here to establish as new pt; overall doing ok,  Pt denies chest pain, increased sob or doe, wheezing, orthopnea, PND, increased LE swelling, palpitations, dizziness or syncope.  Pt denies new neurological symptoms such as new headache, or facial or extremity weakness or numbness   Pt denies polydipsia, polyuria, or low sugar symptoms such as weakness or confusion improved with po intake.  Pt states overall good compliance with meds, trying to follow lower cholesterol, diabetic diet, wt overall stable but little exercise however.  Due for carotid artery f/u, last 2010 Past Medical History  Diagnosis Date  . Cerebellar stroke, acute   . IHD (ischemic heart disease)   . Hiccoughs   . Diabetes mellitus   . Hyperlipidemia   . Vitamin B12 deficiency   . DIABETES MELLITUS, TYPE II 09/20/2008    Qualifier: Diagnosis of  By: Flonnie Overman    . HYPERTENSION 09/20/2008    Qualifier: Diagnosis of  By: Flonnie Overman    . CAD 09/20/2008    Qualifier: Diagnosis of  By: Flonnie Overman    . BRADYCARDIA 09/20/2008    Qualifier: Diagnosis of  By: Flonnie Overman    . 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 TIA 09/20/2008    Qualifier: Diagnosis of  By: Flonnie Overman    . PSORIASIS 09/20/2008    Qualifier: Diagnosis of  By: Flonnie Overman    . Cardiac pacemaker in situ 10/08/2008    Qualifier: Diagnosis of  By: Ladona Ridgel, MD, Jerrell Mylar   . Benign hypertensive heart disease without heart failure 08/03/2010  . Hx of CABG 08/03/2010  . Intractable hiccoughs 08/03/2010  . Tobacco abuse 08/03/2010  . Vascular claudication 11/15/2010  . Dyslipidemia 09/05/2011  . GERD (gastroesophageal reflux disease) 11/06/2011  . Nephrolithiasis 11/06/2011  . Carotid stenosis 11/06/2011    Bilat 60-80% 2010   Past Surgical History  Procedure Date  . Coronary artery bypass  graft   . Coronary angioplasty 09/18/2006    WELL PRESERVED LEFT VENTRICULAR SYSTOLIC FUNCTION. THERE APPEARS TBE SOME HYPOKINESIS OF THE POSTERIOR WALL. EF 60%    reports that he has been smoking Cigarettes.  He has been smoking about 1 pack per day. He has quit using smokeless tobacco. He reports that he does not drink alcohol or use illicit drugs. family history includes COPD in his mother and Coronary artery disease in his father. Allergies  Allergen Reactions  . Imdur (Isosorbide Mononitrate)     HEADACHES   . Metformin And Related     ABDOMINAL CRAMPS   Current Outpatient Prescriptions on File Prior to Visit  Medication Sig Dispense Refill  . baclofen (LIORESAL) 10 MG tablet Take 10 mg by mouth Daily.      Marland Kitchen ezetimibe (ZETIA) 10 MG tablet Take 1 tablet (10 mg total) by mouth daily.  30 tablet  11  . glimepiride (AMARYL) 4 MG tablet Take 1 tablet (4 mg total) by mouth daily before breakfast.  30 tablet  11  . lisinopril-hydrochlorothiazide (PRINZIDE,ZESTORETIC) 20-12.5 MG per tablet Take 1 tablet by mouth daily.  30 tablet  11  . metoprolol succinate (TOPROL-XL) 100 MG 24 hr tablet Take 100 mg by mouth daily. Take with or immediately following a meal.      . nitroGLYCERIN (NITROSTAT) 0.4 MG SL tablet  Place 0.4 mg under the tongue every 5 (five) minutes as needed.        . ranitidine (ZANTAC) 150 MG tablet TAKE 1 TABLET BY MOUTH TWICE A DAY  60 tablet  7  . DISCONTD: chlorproMAZINE (THORAZINE) 25 MG tablet Take 25 mg by mouth 3 (three) times daily. Taking 3 daily       . aspirin 81 MG tablet Take 81 mg by mouth daily.        . pantoprazole (PROTONIX) 40 MG tablet Take 1 tablet (40 mg total) by mouth daily.  90 tablet  3  . DISCONTD: dexlansoprazole (DEXILANT) 60 MG capsule Take 60 mg by mouth daily.      Marland Kitchen DISCONTD: metoprolol (TOPROL XL) 100 MG 24 hr tablet Take 1 tablet (100 mg total) by mouth daily.  30 tablet  11   Review of Systems Review of Systems  Constitutional: Negative  for diaphoresis and unexpected weight change.  HENT: Negative for drooling and tinnitus.   Eyes: Negative for photophobia and visual disturbance.  Respiratory: Negative for choking and stridor.   Gastrointestinal: Negative for vomiting and blood in stool.  Genitourinary: Negative for hematuria and decreased urine volume.  Musculoskeletal: Negative for gait problem.  Skin: Negative for color change and wound.  Neurological: Negative for tremors and numbness.  Psychiatric/Behavioral: Negative for decreased concentration. The patient is not hyperactive.       Objective:   Physical Exam BP 132/82  Pulse 100  Temp 98.3 F (36.8 C) (Oral)  Ht 5\' 6"  (1.676 m)  Wt 172 lb 12 oz (78.359 kg)  BMI 27.88 kg/m2  SpO2 97% Physical Exam  VS noted Constitutional: Pt appears well-developed and well-nourished.  HENT: Head: Normocephalic.  Right Ear: External ear normal.  Left Ear: External ear normal.  Eyes: Conjunctivae and EOM are normal. Pupils are equal, round, and reactive to light.  Neck: Normal range of motion. Neck supple.  Cardiovascular: Normal rate and regular rhythm.   Pulmonary/Chest: Effort normal and breath sounds normal.  Abd:  Soft, NT, non-distended, + BS Neurological: Pt is alert. No cranial nerve deficit.  Skin: Skin is warm. No erythema.  Psychiatric: Pt behavior is normal. Thought content normal. 1+ nervous    Assessment & Plan:

## 2011-11-07 ENCOUNTER — Other Ambulatory Visit: Payer: Self-pay | Admitting: *Deleted

## 2011-11-07 DIAGNOSIS — I6529 Occlusion and stenosis of unspecified carotid artery: Secondary | ICD-10-CM

## 2012-01-08 ENCOUNTER — Encounter: Payer: Self-pay | Admitting: Cardiology

## 2012-01-08 ENCOUNTER — Other Ambulatory Visit (INDEPENDENT_AMBULATORY_CARE_PROVIDER_SITE_OTHER): Payer: Medicare Other

## 2012-01-08 ENCOUNTER — Ambulatory Visit (INDEPENDENT_AMBULATORY_CARE_PROVIDER_SITE_OTHER): Payer: Medicare Other | Admitting: Cardiology

## 2012-01-08 VITALS — BP 124/84 | HR 91 | Resp 18 | Ht 66.0 in | Wt 168.0 lb

## 2012-01-08 DIAGNOSIS — Z951 Presence of aortocoronary bypass graft: Secondary | ICD-10-CM

## 2012-01-08 DIAGNOSIS — R066 Hiccough: Secondary | ICD-10-CM

## 2012-01-08 DIAGNOSIS — E7800 Pure hypercholesterolemia, unspecified: Secondary | ICD-10-CM

## 2012-01-08 DIAGNOSIS — I119 Hypertensive heart disease without heart failure: Secondary | ICD-10-CM

## 2012-01-08 DIAGNOSIS — Z720 Tobacco use: Secondary | ICD-10-CM

## 2012-01-08 DIAGNOSIS — F17200 Nicotine dependence, unspecified, uncomplicated: Secondary | ICD-10-CM

## 2012-01-08 DIAGNOSIS — I259 Chronic ischemic heart disease, unspecified: Secondary | ICD-10-CM | POA: Insufficient documentation

## 2012-01-08 LAB — LIPID PANEL
HDL: 27.5 mg/dL — ABNORMAL LOW (ref >39.00)
Total CHOL/HDL Ratio: 8
Triglycerides: 229 mg/dL — ABNORMAL HIGH (ref 0.0–149.0)

## 2012-01-08 LAB — HEPATIC FUNCTION PANEL
ALT: 7 U/L (ref 0–53)
Bilirubin, Direct: 0.1 mg/dL (ref 0.0–0.3)
Total Bilirubin: 0.6 mg/dL (ref 0.3–1.2)

## 2012-01-08 LAB — BASIC METABOLIC PANEL
BUN: 7 mg/dL (ref 6–23)
CO2: 27 mEq/L (ref 19–32)
GFR: 51.69 mL/min — ABNORMAL LOW (ref >60.00)
Glucose, Bld: 131 mg/dL — ABNORMAL HIGH (ref 70–99)
Potassium: 3.6 mEq/L (ref 3.5–5.1)
Sodium: 138 mEq/L (ref 135–145)

## 2012-01-08 NOTE — Patient Instructions (Addendum)
Your physician recommends that you continue on your current medications as directed. Please refer to the Current Medication list given to you today.  Your physician recommends that you schedule a follow-up appointment in: 4 months with fasting labs (lp/bmet/hfp)   Will obtain labs today and call you with the results (lp./bmet/hfp) 

## 2012-01-08 NOTE — Assessment & Plan Note (Signed)
Unfortunately he continues to smoke between a half a pack and a pack of cigarettes a day against advice

## 2012-01-08 NOTE — Assessment & Plan Note (Signed)
The patient has not been experiencing any symptoms of increased shortness of breath.  He has had no dizziness or syncope.  He had one episode several months ago when he was walking down the hall and his legs gave out under him.  His family called 911.  They came out and checked him and his vital signs were fine and he did not have to go to the hospital.  He does not recall if they checked her blood sugar or not.  He has had no recurrent symptoms

## 2012-01-08 NOTE — Progress Notes (Signed)
Quick Note:  Please report to patient. The recent labs are stable. Continue same medication and careful diet.Lipids are better. ______ 

## 2012-01-08 NOTE — Assessment & Plan Note (Signed)
The patient continues to have episodes of hiccups about once a week.  When they start they last for about 2-1/2 days and then resolve on their own.  He remains on Thorazine 25 mg 3 times a day which may have helped some

## 2012-01-08 NOTE — Assessment & Plan Note (Signed)
The patient denies any chest pain or angina pectoris. 

## 2012-01-08 NOTE — Progress Notes (Signed)
[name] Date of Birth:  [birth year] [facility] [address] [place], [place]  [address] [phone]         Fax   [phone]  History of Present Illness: This pleasant 66 year old gentleman is seen for a four-month followup office visit. He has a past history of ischemic heart disease. He underwent coronary artery bypass graft surgery on 05/21/08. He has had prior strokes. 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 He's had long-standing high blood pressure. He has claudication of the legs and peripheral arterial occlusive disease and he continues to smoke a half to a full pack of cigarettes a day against advice.  He has a history of sick sinus syndrome and has a functioning pacemaker.   Current Outpatient Prescriptions  Medication Sig Dispense Refill  . aspirin 81 MG tablet Take 81 mg by mouth daily.        Marland Kitchen atorvastatin (LIPITOR) 40 MG tablet Take 1 tablet (40 mg total) by mouth daily.  90 tablet  3  . baclofen (LIORESAL) 10 MG tablet Take 10 mg by mouth Daily.      . chlorproMAZINE (THORAZINE) 25 MG tablet Take 1 tablet (25 mg total) by mouth 3 (three) times daily. Taking 3 daily  90 tablet  1  . dexlansoprazole (DEXILANT) 60 MG capsule Take 1 capsule (60 mg total) by mouth daily.  90 capsule  3  . ezetimibe (ZETIA) 10 MG tablet Take 1 tablet (10 mg total) by mouth daily.  30 tablet  11  . glimepiride (AMARYL) 4 MG tablet Take 1 tablet (4 mg total) by mouth daily before breakfast.  30 tablet  11  . lisinopril-hydrochlorothiazide (PRINZIDE,ZESTORETIC) 20-12.5 MG per tablet Take 1 tablet by mouth daily.  30 tablet  11  . metoprolol succinate (TOPROL-XL) 100 MG 24 hr tablet Take 100 mg by mouth daily. Take with or immediately following a meal.      . metoprolol succinate (TOPROL-XL) 100 MG 24 hr tablet TAKE 1 TABLET BY MOUTH EVERY DAY  30 tablet  11  . nitroGLYCERIN (NITROSTAT) 0.4 MG SL  tablet Place 0.4 mg under the tongue every 5 (five) minutes as needed.        . pantoprazole (PROTONIX) 40 MG tablet Take 40 mg by mouth daily.      . ranitidine (ZANTAC) 150 MG tablet TAKE 1 TABLET BY MOUTH TWICE A DAY  60 tablet  7  . [DISCONTINUED] pantoprazole (PROTONIX) 40 MG tablet Take 1 tablet (40 mg total) by mouth daily.  90 tablet  3    Allergies  Allergen Reactions  . Imdur (Isosorbide Mononitrate)     HEADACHES   . Metformin And Related     ABDOMINAL CRAMPS    Patient Active Problem List  Diagnosis  . DIABETES MELLITUS, TYPE II  . HYPERTENSION  . CAD  . BRADYCARDIA  . Vertebrobasilar artery syndrome  . TIA  . PSORIASIS  . Cardiac pacemaker in situ  . Benign hypertensive heart disease without heart failure  . Hx of CABG  . Intractable hiccoughs  . Tobacco abuse  . Pacemaker  . Vascular claudication  . Dyslipidemia  . Preventative health care  . GERD (gastroesophageal reflux disease)  . Nephrolithiasis  . Carotid stenosis  . Ischemic heart disease    History  Smoking status  . Current Every Day Smoker -- 1.0 packs/day  . Types: Cigarettes  Smokeless tobacco  .  Former Neurosurgeon    History  Alcohol Use No    Family History  Problem Relation Age of Onset  . COPD Mother   . Coronary artery disease Father     Review of Systems: Constitutional: no fever chills diaphoresis or fatigue or change in weight.  Head and neck: no hearing loss, no epistaxis, no photophobia or visual disturbance. Respiratory: No cough, shortness of breath or wheezing. Cardiovascular: No chest pain peripheral edema, palpitations. Gastrointestinal: No abdominal distention, no abdominal pain, no change in bowel habits hematochezia or melena. Genitourinary: No dysuria, no frequency, no urgency, no nocturia. Musculoskeletal:No arthralgias, no back pain, no gait disturbance or myalgias. Neurological: No dizziness, no headaches, no numbness, no seizures, no syncope, no weakness, no  tremors. Hematologic: No lymphadenopathy, no easy bruising. Psychiatric: No confusion, no hallucinations, no sleep disturbance.    Physical Exam: Filed Vitals:   01/08/12 0846  BP: 124/84  Pulse: 91  Resp: 18   the general appearance reveals a well-developed well-nourished gentleman in no distress.Pupils equal and reactive.   Extraocular Movements are full.  There is no scleral icterus.  The mouth and pharynx are normal.  The neck is supple.  The carotids reveal no bruits.  The jugular venous pressure is normal.  The thyroid is not enlarged.  There is no lymphadenopathy.  Chest reveals mild expiratory wheezes and rhonchi. The precordium is quiet.  The first heart sound is normal.  The second heart sound is physiologically split.  There is no gallop rub or click.  There is a soft systolic ejection murmur at the base. There is no abnormal lift or heave.  The abdomen is soft and nontender. Bowel sounds are normal. The liver and spleen are not enlarged. There Are no abdominal masses. There are no bruits.  The pedal pulses are weak.  There is no phlebitis or edema.  There is no cyanosis or clubbing.    Assessment / Plan:  Continue on same medication.  Blood work today pending.  Check in 4 months for followup office visit EKG lipid panel hepatic function panel and basal metabolic panel.

## 2012-01-09 ENCOUNTER — Telehealth: Payer: Self-pay | Admitting: *Deleted

## 2012-01-09 NOTE — Telephone Encounter (Signed)
Message copied by Burnell Blanks on Wed Jan 09, 2012  2:49 PM ------      Message from: [name]      Created: Tue Jan 08, 2012  9:10 PM       Please report to patient.  The recent labs are stable. Continue same medication and careful diet.Lipids are better.

## 2012-01-09 NOTE — Telephone Encounter (Signed)
Advised patient of lab results  

## 2012-01-25 ENCOUNTER — Other Ambulatory Visit: Payer: Self-pay | Admitting: Internal Medicine

## 2012-01-25 NOTE — Telephone Encounter (Signed)
Done erx 

## 2012-02-07 ENCOUNTER — Ambulatory Visit (INDEPENDENT_AMBULATORY_CARE_PROVIDER_SITE_OTHER): Payer: Medicare Other | Admitting: Internal Medicine

## 2012-02-07 ENCOUNTER — Other Ambulatory Visit: Payer: Self-pay | Admitting: Internal Medicine

## 2012-02-07 ENCOUNTER — Encounter: Payer: Self-pay | Admitting: Internal Medicine

## 2012-02-07 ENCOUNTER — Other Ambulatory Visit (INDEPENDENT_AMBULATORY_CARE_PROVIDER_SITE_OTHER): Payer: Medicare Other

## 2012-02-07 VITALS — BP 120/80 | HR 79 | Temp 97.6°F | Ht 66.0 in | Wt 171.2 lb

## 2012-02-07 DIAGNOSIS — E119 Type 2 diabetes mellitus without complications: Secondary | ICD-10-CM

## 2012-02-07 DIAGNOSIS — R066 Hiccough: Secondary | ICD-10-CM

## 2012-02-07 DIAGNOSIS — I10 Essential (primary) hypertension: Secondary | ICD-10-CM

## 2012-02-07 DIAGNOSIS — E785 Hyperlipidemia, unspecified: Secondary | ICD-10-CM

## 2012-02-07 LAB — LIPID PANEL
Cholesterol: 235 mg/dL — ABNORMAL HIGH (ref 0–200)
HDL: 33.6 mg/dL — ABNORMAL LOW (ref >39.00)
Triglycerides: 239 mg/dL — ABNORMAL HIGH (ref 0.0–149.0)
VLDL: 47.8 mg/dL — ABNORMAL HIGH (ref 0.0–40.0)

## 2012-02-07 LAB — BASIC METABOLIC PANEL
Calcium: 9.4 mg/dL (ref 8.4–10.5)
GFR: 50.09 mL/min — ABNORMAL LOW (ref >60.00)
Potassium: 3.9 mEq/L (ref 3.5–5.1)
Sodium: 137 mEq/L (ref 135–145)

## 2012-02-07 LAB — HEPATIC FUNCTION PANEL
AST: 14 U/L (ref 0–37)
Alkaline Phosphatase: 89 U/L (ref 39–117)
Total Bilirubin: 0.8 mg/dL (ref 0.3–1.2)

## 2012-02-07 LAB — HEMOGLOBIN A1C: Hgb A1c MFr Bld: 7.2 % — ABNORMAL HIGH (ref 4.6–6.5)

## 2012-02-07 MED ORDER — ATORVASTATIN CALCIUM 80 MG PO TABS: 80.0000 mg | ORAL_TABLET | Freq: Every day | ORAL | Status: DC

## 2012-02-07 NOTE — Assessment & Plan Note (Signed)
stable overall by hx and exam, most recent data reviewed with pt, and pt to continue medical treatment as before Lab Results  Component Value Date   HGBA1C 7.6* 09/05/2011

## 2012-02-07 NOTE — Progress Notes (Signed)
Subjective:    Patient ID: [name], male    DOB: 19-Oct-1945, 66 y.o.   MRN: [id number]  HPI  Here to f/u; overall doing ok,  Pt denies chest pain, increased sob or doe, wheezing, orthopnea, PND, increased LE swelling, palpitations, dizziness or syncope.  Pt denies new neurological symptoms such as new headache, or facial or extremity weakness or numbness   Pt denies polydipsia, polyuria, or low sugar symptoms such as weakness or confusion improved with po intake.  Pt states overall good compliance with meds, trying to follow lower cholesterol, diabetic diet, wt overall stable but little exercise however.  Hiccups some improved, now only symptomatic about one day per wk per pt from 3 days per wk.  Thinks about trying to quit smoking, but not committed yet.   Past Medical History  Diagnosis Date  . Cerebellar stroke, acute   . IHD (ischemic heart disease)   . Hiccoughs   . Diabetes mellitus   . Hyperlipidemia   . Vitamin B12 deficiency   . DIABETES MELLITUS, TYPE II 09/20/2008    Qualifier: Diagnosis of  By: Flonnie Overman    . HYPERTENSION 09/20/2008    Qualifier: Diagnosis of  By: Flonnie Overman    . CAD 09/20/2008    Qualifier: Diagnosis of  By: Flonnie Overman    . BRADYCARDIA 09/20/2008    Qualifier: Diagnosis of  By: Flonnie Overman    . 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 TIA 09/20/2008    Qualifier: Diagnosis of  By: Flonnie Overman    . PSORIASIS 09/20/2008    Qualifier: Diagnosis of  By: Flonnie Overman    . Cardiac pacemaker in situ 10/08/2008    Qualifier: Diagnosis of  By: Ladona Ridgel, MD, Jerrell Mylar   . Benign hypertensive heart disease without heart failure 08/03/2010  . Hx of CABG 08/03/2010  . Intractable hiccoughs 08/03/2010  . Tobacco abuse 08/03/2010  . Vascular claudication 11/15/2010  . Dyslipidemia 09/05/2011  . GERD (gastroesophageal reflux disease) 11/06/2011  . Nephrolithiasis 11/06/2011  . Carotid stenosis  11/06/2011    Bilat 60-80% 2010   Past Surgical History  Procedure Date  . Coronary artery bypass graft   . Coronary angioplasty 09/18/2006    WELL PRESERVED LEFT VENTRICULAR SYSTOLIC FUNCTION. THERE APPEARS TBE SOME HYPOKINESIS OF THE POSTERIOR WALL. EF 60%    reports that he has been smoking Cigarettes.  He has been smoking about 1 pack per day. He has quit using smokeless tobacco. He reports that he does not drink alcohol or use illicit drugs. family history includes COPD in his mother and Coronary artery disease in his father. Allergies  Allergen Reactions  . Imdur (Isosorbide Mononitrate)     HEADACHES   . Metformin And Related     ABDOMINAL CRAMPS   Current Outpatient Prescriptions on File Prior to Visit  Medication Sig Dispense Refill  . aspirin 81 MG tablet Take 81 mg by mouth daily.        Marland Kitchen atorvastatin (LIPITOR) 40 MG tablet Take 1 tablet (40 mg total) by mouth daily.  90 tablet  3  . baclofen (LIORESAL) 10 MG tablet Take 10 mg by mouth Daily.      . chlorproMAZINE (THORAZINE) 25 MG tablet TAKE 1 TABLET (25 MG TOTAL) BY MOUTH 3 (THREE) TIMES DAILY. TAKING 3 DAILY  90 tablet  1  . dexlansoprazole (DEXILANT) 60 MG capsule Take 1 capsule (60  mg total) by mouth daily.  90 capsule  3  . ezetimibe (ZETIA) 10 MG tablet Take 1 tablet (10 mg total) by mouth daily.  30 tablet  11  . lisinopril-hydrochlorothiazide (PRINZIDE,ZESTORETIC) 20-12.5 MG per tablet Take 1 tablet by mouth daily.  30 tablet  11  . metoprolol succinate (TOPROL-XL) 100 MG 24 hr tablet Take 100 mg by mouth daily. Take with or immediately following a meal.      . metoprolol succinate (TOPROL-XL) 100 MG 24 hr tablet TAKE 1 TABLET BY MOUTH EVERY DAY  30 tablet  11  . nitroGLYCERIN (NITROSTAT) 0.4 MG SL tablet Place 0.4 mg under the tongue every 5 (five) minutes as needed.        . ranitidine (ZANTAC) 150 MG tablet TAKE 1 TABLET BY MOUTH TWICE A DAY  60 tablet  7  . glimepiride (AMARYL) 4 MG tablet Take 1 tablet (4 mg  total) by mouth daily before breakfast.  30 tablet  11  . pantoprazole (PROTONIX) 40 MG tablet Take 40 mg by mouth daily.       Review of Systems  Constitutional: Negative for diaphoresis and unexpected weight change.  HENT: Negative for tinnitus.   Eyes: Negative for photophobia and visual disturbance.  Respiratory: Negative for choking and stridor.   Gastrointestinal: Negative for vomiting and blood in stool.  Genitourinary: Negative for hematuria and decreased urine volume.  Musculoskeletal: Negative for gait problem.  Skin: Negative for color change and wound.  Neurological: Negative for tremors and numbness.  Psychiatric/Behavioral: Negative for decreased concentration. The patient is not hyperactive.       Objective:   Physical Exam BP 120/80  Pulse 79  Temp 97.6 F (36.4 C) (Oral)  Ht 5\' 6"  (1.676 m)  Wt 171 lb 4 oz (77.678 kg)  BMI 27.64 kg/m2  SpO2 98% Physical Exam  VS noted Constitutional: Pt appears well-developed and well-nourished.  HENT: Head: Normocephalic.  Right Ear: External ear normal.  Left Ear: External ear normal.  Eyes: Conjunctivae and EOM are normal. Pupils are equal, round, and reactive to light.  Neck: Normal range of motion. Neck supple.  Cardiovascular: Normal rate and regular rhythm.   Pulmonary/Chest: Effort normal and breath sounds normal.  Neurological: Pt is alert. Not confused  Skin: Skin is warm. No erythema.  Psychiatric: Pt behavior is normal. Thought content normal.     Assessment & Plan:

## 2012-02-07 NOTE — Assessment & Plan Note (Signed)
With some improvement with increased thorazine, stable overall by hx and exam and pt to continue medical treatment as before

## 2012-02-07 NOTE — Patient Instructions (Addendum)
Continue all other medications as before Please have the pharmacy call with any refills you may need. Please continue your efforts at being more active, low cholesterol diabetic diet, and weight control. Please go to LAB in the Basement for the blood and/or urine tests to be done today You will be contacted by phone if any changes need to be made immediately.  Otherwise, you will receive a letter about your results with an explanation, but please check with MyChart first. Thank you for enrolling in MyChart. Please follow the instructions below to securely access your online medical record. MyChart allows you to send messages to your doctor, view your test results, renew your prescriptions, schedule appointments, and more. To Log into MyChart, please go to [email or web address], and your Username is:  [email or web address] Please call or send a message on Mychart if you would want to try the Chantix for smoking cessation Please return in 6 months, or sooner if needed

## 2012-02-07 NOTE — Assessment & Plan Note (Signed)
stable overall by hx and exam, most recent data reviewed with pt, and pt to continue medical treatment as before BP Readings from Last 3 Encounters:  02/07/12 120/80  01/08/12 124/84  11/06/11 132/82

## 2012-02-07 NOTE — Assessment & Plan Note (Signed)
With elev LDL last visit, for f/u today, cont diet, consider incr lipitor Lab Results  Component Value Date   LDLCALC  Value: UNABLE TO CALCULATE IF TRIGLYCERIDE OVER 400 mg/dL        Total Cholesterol/HDL:CHD Risk Coronary Heart Disease Risk Table                     Men   Women  1/2 Average Risk   3.4   3.3  Average Risk       5.0   4.4  2 X Average Risk   9.6   7.1  3 X Average Risk  23.4   11.0        Use the calculated Patient Ratio above and the CHD Risk Table to determine the patient's CHD Risk.        ATP III CLASSIFICATION (LDL):  <100     mg/dL   Optimal  454-098  mg/dL   Near or Above                    Optimal  130-159  mg/dL   Borderline  119-147  mg/dL   High  >829     mg/dL   Very High 5/62/1308

## 2012-02-18 ENCOUNTER — Inpatient Hospital Stay (HOSPITAL_COMMUNITY)
Admission: EM | Admit: 2012-02-18 | Discharge: 2012-02-20 | DRG: 264 | Disposition: A | Discharge status: Home / Self Care | Payer: Medicare Other | Attending: Internal Medicine | Admitting: Internal Medicine

## 2012-02-18 ENCOUNTER — Telehealth: Payer: Self-pay | Admitting: Cardiology

## 2012-02-18 ENCOUNTER — Ambulatory Visit: Payer: Medicare Other | Admitting: Internal Medicine

## 2012-02-18 ENCOUNTER — Encounter (HOSPITAL_COMMUNITY): Payer: Self-pay | Admitting: *Deleted

## 2012-02-18 ENCOUNTER — Telehealth: Payer: Self-pay | Admitting: Internal Medicine

## 2012-02-18 DIAGNOSIS — I739 Peripheral vascular disease, unspecified: Secondary | ICD-10-CM

## 2012-02-18 DIAGNOSIS — I998 Other disorder of circulatory system: Secondary | ICD-10-CM | POA: Diagnosis present

## 2012-02-18 DIAGNOSIS — F17200 Nicotine dependence, unspecified, uncomplicated: Secondary | ICD-10-CM | POA: Diagnosis present

## 2012-02-18 DIAGNOSIS — I259 Chronic ischemic heart disease, unspecified: Secondary | ICD-10-CM

## 2012-02-18 DIAGNOSIS — Z0000 Encounter for general adult medical examination without abnormal findings: Secondary | ICD-10-CM

## 2012-02-18 DIAGNOSIS — I10 Essential (primary) hypertension: Secondary | ICD-10-CM | POA: Diagnosis present

## 2012-02-18 DIAGNOSIS — N183 Chronic kidney disease, stage 3 unspecified: Secondary | ICD-10-CM | POA: Diagnosis present

## 2012-02-18 DIAGNOSIS — E785 Hyperlipidemia, unspecified: Secondary | ICD-10-CM

## 2012-02-18 DIAGNOSIS — Z951 Presence of aortocoronary bypass graft: Secondary | ICD-10-CM

## 2012-02-18 DIAGNOSIS — N200 Calculus of kidney: Secondary | ICD-10-CM

## 2012-02-18 DIAGNOSIS — Z950 Presence of cardiac pacemaker: Secondary | ICD-10-CM | POA: Diagnosis present

## 2012-02-18 DIAGNOSIS — Z79899 Other long term (current) drug therapy: Secondary | ICD-10-CM

## 2012-02-18 DIAGNOSIS — I639 Cerebral infarction, unspecified: Secondary | ICD-10-CM

## 2012-02-18 DIAGNOSIS — E7800 Pure hypercholesterolemia, unspecified: Secondary | ICD-10-CM

## 2012-02-18 DIAGNOSIS — I129 Hypertensive chronic kidney disease with stage 1 through stage 4 chronic kidney disease, or unspecified chronic kidney disease: Secondary | ICD-10-CM | POA: Diagnosis present

## 2012-02-18 DIAGNOSIS — M79609 Pain in unspecified limb: Secondary | ICD-10-CM

## 2012-02-18 DIAGNOSIS — R443 Hallucinations, unspecified: Secondary | ICD-10-CM

## 2012-02-18 DIAGNOSIS — G459 Transient cerebral ischemic attack, unspecified: Secondary | ICD-10-CM

## 2012-02-18 DIAGNOSIS — R410 Disorientation, unspecified: Secondary | ICD-10-CM

## 2012-02-18 DIAGNOSIS — R4182 Altered mental status, unspecified: Secondary | ICD-10-CM

## 2012-02-18 DIAGNOSIS — I999 Unspecified disorder of circulatory system: Secondary | ICD-10-CM

## 2012-02-18 DIAGNOSIS — G450 Vertebro-basilar artery syndrome: Secondary | ICD-10-CM

## 2012-02-18 DIAGNOSIS — Z8673 Personal history of transient ischemic attack (TIA), and cerebral infarction without residual deficits: Secondary | ICD-10-CM

## 2012-02-18 DIAGNOSIS — I2510 Atherosclerotic heart disease of native coronary artery without angina pectoris: Secondary | ICD-10-CM

## 2012-02-18 DIAGNOSIS — E119 Type 2 diabetes mellitus without complications: Secondary | ICD-10-CM | POA: Diagnosis present

## 2012-02-18 DIAGNOSIS — R066 Hiccough: Secondary | ICD-10-CM

## 2012-02-18 DIAGNOSIS — I119 Hypertensive heart disease without heart failure: Secondary | ICD-10-CM

## 2012-02-18 DIAGNOSIS — I6789 Other cerebrovascular disease: Secondary | ICD-10-CM

## 2012-02-18 DIAGNOSIS — I6529 Occlusion and stenosis of unspecified carotid artery: Secondary | ICD-10-CM

## 2012-02-18 DIAGNOSIS — I70209 Unspecified atherosclerosis of native arteries of extremities, unspecified extremity: Principal | ICD-10-CM | POA: Diagnosis present

## 2012-02-18 DIAGNOSIS — Z720 Tobacco use: Secondary | ICD-10-CM

## 2012-02-18 DIAGNOSIS — L408 Other psoriasis: Secondary | ICD-10-CM

## 2012-02-18 DIAGNOSIS — K219 Gastro-esophageal reflux disease without esophagitis: Secondary | ICD-10-CM

## 2012-02-18 DIAGNOSIS — I498 Other specified cardiac arrhythmias: Secondary | ICD-10-CM | POA: Diagnosis present

## 2012-02-18 LAB — POCT I-STAT, CHEM 8
Calcium, Ion: 1.21 mmol/L (ref 1.13–1.30)
Chloride: 98 mEq/L (ref 96–112)
Creatinine, Ser: 1.3 mg/dL (ref 0.50–1.35)
Glucose, Bld: 182 mg/dL — ABNORMAL HIGH (ref 70–99)
Potassium: 3.6 mEq/L (ref 3.5–5.1)

## 2012-02-18 LAB — CBC WITH DIFFERENTIAL/PLATELET
Basophils Absolute: 0.2 10*3/uL — ABNORMAL HIGH (ref 0.0–0.1)
Eosinophils Absolute: 0.1 10*3/uL (ref 0.0–0.7)
Eosinophils Relative: 1 % (ref 0–5)
HCT: 43.6 % (ref 39.0–52.0)
Lymphocytes Relative: 11 % — ABNORMAL LOW (ref 12–46)
MCH: 29.9 pg (ref 26.0–34.0)
MCHC: 34.6 g/dL (ref 30.0–36.0)
MCV: 86.3 fL (ref 78.0–100.0)
Monocytes Absolute: 0.9 10*3/uL (ref 0.1–1.0)
Platelets: 247 10*3/uL (ref 150–400)
RDW: 14.8 % (ref 11.5–15.5)

## 2012-02-18 LAB — PROTIME-INR: INR: 1.07 (ref 0.00–1.49)

## 2012-02-18 MED ORDER — FAMOTIDINE 20 MG PO TABS
20.0000 mg | ORAL_TABLET | Freq: Every day | ORAL | Status: DC
  Administered 2012-02-19 – 2012-02-20 (×2): 20 mg via ORAL
  Filled 2012-02-18 (×2): qty 1

## 2012-02-18 MED ORDER — ONDANSETRON HCL 4 MG PO TABS: 4.0000 mg | ORAL_TABLET | Freq: Four times a day (QID) | ORAL | Status: DC | PRN

## 2012-02-18 MED ORDER — INSULIN ASPART 100 UNIT/ML ~~LOC~~ SOLN
0.0000 [IU] | Freq: Three times a day (TID) | SUBCUTANEOUS | Status: DC
  Administered 2012-02-19: 7 [IU] via SUBCUTANEOUS

## 2012-02-18 MED ORDER — SODIUM CHLORIDE 0.9 % IV SOLN
INTRAVENOUS | Status: DC
  Administered 2012-02-19: via INTRAVENOUS

## 2012-02-18 MED ORDER — MORPHINE SULFATE 4 MG/ML IJ SOLN
4.0000 mg | Freq: Once | INTRAMUSCULAR | Status: AC
  Administered 2012-02-18: 4 mg via INTRAVENOUS
  Filled 2012-02-18: qty 1

## 2012-02-18 MED ORDER — MORPHINE SULFATE 4 MG/ML IJ SOLN: 4.0000 mg | INTRAMUSCULAR | Status: DC | PRN

## 2012-02-18 MED ORDER — ONDANSETRON HCL 4 MG/2ML IJ SOLN
4.0000 mg | Freq: Once | INTRAMUSCULAR | Status: AC
  Administered 2012-02-18: 4 mg via INTRAVENOUS
  Filled 2012-02-18: qty 2

## 2012-02-18 MED ORDER — SODIUM CHLORIDE 0.9 % IV SOLN: INTRAVENOUS | Status: DC

## 2012-02-18 MED ORDER — ACETAMINOPHEN 650 MG RE SUPP: 650.0000 mg | Freq: Four times a day (QID) | RECTAL | Status: DC | PRN

## 2012-02-18 MED ORDER — ONDANSETRON HCL 4 MG/2ML IJ SOLN: 4.0000 mg | Freq: Four times a day (QID) | INTRAMUSCULAR | Status: DC | PRN

## 2012-02-18 MED ORDER — HEPARIN (PORCINE) IN NACL 100-0.45 UNIT/ML-% IJ SOLN
1300.0000 [IU]/h | INTRAMUSCULAR | Status: DC
  Administered 2012-02-18: 1300 [IU]/h via INTRAVENOUS
  Filled 2012-02-18 (×3): qty 250

## 2012-02-18 MED ORDER — PANTOPRAZOLE SODIUM 40 MG PO TBEC: 40.0000 mg | DELAYED_RELEASE_TABLET | Freq: Every day | ORAL | Status: DC

## 2012-02-18 MED ORDER — ACETAMINOPHEN 325 MG PO TABS: 650.0000 mg | ORAL_TABLET | Freq: Four times a day (QID) | ORAL | Status: DC | PRN

## 2012-02-18 MED ORDER — GLIMEPIRIDE 4 MG PO TABS
4.0000 mg | ORAL_TABLET | Freq: Every day | ORAL | Status: DC
  Filled 2012-02-18 (×2): qty 1

## 2012-02-18 MED ORDER — ASPIRIN 81 MG PO TABS: 81.0000 mg | ORAL_TABLET | Freq: Every day | ORAL | Status: DC

## 2012-02-18 MED ORDER — BACLOFEN 10 MG PO TABS
10.0000 mg | ORAL_TABLET | Freq: Every day | ORAL | Status: DC
  Administered 2012-02-19 – 2012-02-20 (×2): 10 mg via ORAL
  Filled 2012-02-18 (×2): qty 1

## 2012-02-18 MED ORDER — PANTOPRAZOLE SODIUM 40 MG PO TBEC
40.0000 mg | DELAYED_RELEASE_TABLET | Freq: Every day | ORAL | Status: DC
  Administered 2012-02-19 – 2012-02-20 (×2): 40 mg via ORAL
  Filled 2012-02-18 (×2): qty 1

## 2012-02-18 MED ORDER — ATORVASTATIN CALCIUM 80 MG PO TABS
80.0000 mg | ORAL_TABLET | Freq: Every day | ORAL | Status: DC
  Administered 2012-02-19 – 2012-02-20 (×2): 80 mg via ORAL
  Filled 2012-02-18 (×2): qty 1

## 2012-02-18 MED ORDER — LISINOPRIL-HYDROCHLOROTHIAZIDE 20-12.5 MG PO TABS: 1.0000 | ORAL_TABLET | Freq: Every day | ORAL | Status: DC

## 2012-02-18 MED ORDER — SODIUM CHLORIDE 0.9 % IJ SOLN
3.0000 mL | Freq: Two times a day (BID) | INTRAMUSCULAR | Status: DC
  Administered 2012-02-19: 3 mL via INTRAVENOUS

## 2012-02-18 MED ORDER — METOPROLOL SUCCINATE ER 100 MG PO TB24
100.0000 mg | ORAL_TABLET | Freq: Every day | ORAL | Status: DC
  Administered 2012-02-19 – 2012-02-20 (×2): 100 mg via ORAL
  Filled 2012-02-18 (×2): qty 1

## 2012-02-18 MED ORDER — CHLORPROMAZINE HCL 25 MG PO TABS
25.0000 mg | ORAL_TABLET | Freq: Every day | ORAL | Status: DC
  Administered 2012-02-19 – 2012-02-20 (×2): 25 mg via ORAL
  Filled 2012-02-18 (×2): qty 1

## 2012-02-18 MED ORDER — HEPARIN BOLUS VIA INFUSION
5000.0000 [IU] | Freq: Once | INTRAVENOUS | Status: AC
  Administered 2012-02-18: 5000 [IU] via INTRAVENOUS

## 2012-02-18 MED ORDER — NITROGLYCERIN 0.4 MG SL SUBL: 0.4000 mg | SUBLINGUAL_TABLET | SUBLINGUAL | Status: DC | PRN

## 2012-02-18 MED ORDER — SODIUM CHLORIDE 0.9 % IV SOLN
INTRAVENOUS | Status: DC
  Administered 2012-02-18: 20:00:00 via INTRAVENOUS

## 2012-02-18 MED ORDER — EZETIMIBE 10 MG PO TABS
10.0000 mg | ORAL_TABLET | Freq: Every day | ORAL | Status: DC
  Administered 2012-02-19 – 2012-02-20 (×2): 10 mg via ORAL
  Filled 2012-02-18 (×2): qty 1

## 2012-02-18 NOTE — Telephone Encounter (Signed)
Pt has a red little toe that has been sore x 1 week. Over the w/e the pain intensified. Today the pain is shooting up his leg and when he tries to walk the leg just "collapses" d/t weakness. Pt is afeb. But his leg is "on fire". Pt had called this am and was given an appt at 3:45 today. However, when RN triaged him/ RN advised ED d/t severity of symptoms and sent him to the [facility] ED now. Appt cancelled at office.

## 2012-02-18 NOTE — Telephone Encounter (Signed)
Advised  Dr. Patty Sermons not in the office today to contact PCP. [name] verbalized understanding and stated she would call for appointment

## 2012-02-18 NOTE — H&P (Signed)
[name] is an 66 y.o. male.   Patient was seen and examined on February 18, 2012. PCP - Dr. Oliver Barre. Chief Complaint: Left toe discoloration and pain. HPI: 66 year-old male history of CAD status post CABG, hypertension, diabetes mellitus2, ongoing tobacco abuse started noticing discoloration of his left little toe 2 days ago. Slowly started worsening pain and other toes also got discolored. At this point patient presented the ER today and had Dopplers done which showed poor blood flow and vascular surgeon on call Dr. Johny Drilling was consulted and patient has been admitted for further management. Patient also has been started IV heparin infusion. Otherwise patient denies any chest pain shortness of breath fever chills or any trauma.  Past Medical History  Diagnosis Date  . Cerebellar stroke, acute   . IHD (ischemic heart disease)   . Hiccoughs   . Diabetes mellitus   . Hyperlipidemia   . Vitamin B12 deficiency   . DIABETES MELLITUS, TYPE II 09/20/2008    Qualifier: Diagnosis of  By: Flonnie Overman    . HYPERTENSION 09/20/2008    Qualifier: Diagnosis of  By: Flonnie Overman    . CAD 09/20/2008    Qualifier: Diagnosis of  By: Flonnie Overman    . BRADYCARDIA 09/20/2008    Qualifier: Diagnosis of  By: Flonnie Overman    . 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 TIA 09/20/2008    Qualifier: Diagnosis of  By: Flonnie Overman    . PSORIASIS 09/20/2008    Qualifier: Diagnosis of  By: Flonnie Overman    . Cardiac pacemaker in situ 10/08/2008    Qualifier: Diagnosis of  By: Ladona Ridgel, MD, Jerrell Mylar   . Benign hypertensive heart disease without heart failure 08/03/2010  . Hx of CABG 08/03/2010  . Intractable hiccoughs 08/03/2010  . Tobacco abuse 08/03/2010  . Vascular claudication 11/15/2010  . Dyslipidemia 09/05/2011  . GERD (gastroesophageal reflux disease) 11/06/2011  . Nephrolithiasis 11/06/2011  . Carotid stenosis 11/06/2011    Bilat 60-80% 2010     Past Surgical History  Procedure Date  . Coronary artery bypass graft   . Coronary angioplasty 09/18/2006    WELL PRESERVED LEFT VENTRICULAR SYSTOLIC FUNCTION. THERE APPEARS TBE SOME HYPOKINESIS OF THE POSTERIOR WALL. EF 60%    Family History  Problem Relation Age of Onset  . COPD Mother   . Coronary artery disease Father    Social History:  reports that he has been smoking Cigarettes.  He has been smoking about 1 pack per day. He has quit using smokeless tobacco. He reports that he does not drink alcohol or use illicit drugs.  Allergies:  Allergies  Allergen Reactions  . Imdur (Isosorbide Mononitrate)     HEADACHES   . Metformin And Related     ABDOMINAL CRAMPS     (Not in a hospital admission)  Results for orders placed during the hospital encounter of 02/18/12 (from the past 48 hour(s))  CBC WITH DIFFERENTIAL     Status: Abnormal   Collection Time   02/18/12  2:20 PM      Component Value Range Comment   WBC 8.8  4.0 - 10.5 K/uL    RBC 5.05  4.22 - 5.81 MIL/uL    Hemoglobin 15.1  13.0 - 17.0 g/dL    HCT 16.1  09.6 - 04.5 %    MCV 86.3  78.0 - 100.0 fL    MCH 29.9  26.0 - 34.0 pg    MCHC 34.6  30.0 - 36.0 g/dL    RDW 16.1  09.6 - 04.5 %    Platelets 247  150 - 400 K/uL    Neutrophils Relative 76  43 - 77 %    Neutro Abs 6.7  1.7 - 7.7 K/uL    Lymphocytes Relative 11 (*) 12 - 46 %    Lymphs Abs 1.0  0.7 - 4.0 K/uL    Monocytes Relative 10  3 - 12 %    Monocytes Absolute 0.9  0.1 - 1.0 K/uL    Eosinophils Relative 1  0 - 5 %    Eosinophils Absolute 0.1  0.0 - 0.7 K/uL    Basophils Relative 2 (*) 0 - 1 %    Basophils Absolute 0.2 (*) 0.0 - 0.1 K/uL   POCT I-STAT, CHEM 8     Status: Abnormal   Collection Time   02/18/12  5:43 PM      Component Value Range Comment   Sodium 137  135 - 145 mEq/L    Potassium 3.6  3.5 - 5.1 mEq/L    Chloride 98  96 - 112 mEq/L    BUN 8  6 - 23 mg/dL    Creatinine, Ser 4.09  0.50 - 1.35 mg/dL    Glucose, Bld 811 (*) 70 - 99  mg/dL    Calcium, Ion 9.14  7.82 - 1.30 mmol/L    TCO2 29  0 - 100 mmol/L    Hemoglobin 15.3  13.0 - 17.0 g/dL    HCT 95.6  21.3 - 08.6 %   APTT     Status: Normal   Collection Time   02/18/12  7:24 PM      Component Value Range Comment   aPTT 29  24 - 37 seconds   PROTIME-INR     Status: Normal   Collection Time   02/18/12  7:24 PM      Component Value Range Comment   Prothrombin Time 13.8  11.6 - 15.2 seconds    INR 1.07  0.00 - 1.49    No results found.  Review of Systems  Constitutional: Negative.   HENT: Negative.   Eyes: Negative.   Respiratory: Negative.   Cardiovascular: Negative.   Gastrointestinal: Negative.   Genitourinary: Negative.   Musculoskeletal:       Left leg toes discoloration and pain.  Skin: Negative.   Neurological: Negative.   Endo/Heme/Allergies: Negative.   Psychiatric/Behavioral: Negative.     Blood pressure 135/81, pulse 70, temperature 97.9 F (36.6 C), temperature source Oral, resp. rate 14, height 5\' 6"  (1.676 m), weight 80.74 kg (178 lb), SpO2 98.00%. Physical Exam  Constitutional: He is oriented to person, place, and time. He appears well-developed and well-nourished. No distress.  HENT:  Head: Normocephalic and atraumatic.  Right Ear: External ear normal.  Left Ear: External ear normal.  Nose: Nose normal.  Mouth/Throat: Oropharynx is clear and moist. No oropharyngeal exudate.  Eyes: Conjunctivae normal are normal. Pupils are equal, round, and reactive to light. Right eye exhibits no discharge. Left eye exhibits no discharge. No scleral icterus.  Neck: Normal range of motion. Neck supple.  Cardiovascular: Normal rate and regular rhythm.   Respiratory: Effort normal and breath sounds normal.  GI: Soft. Bowel sounds are normal. He exhibits no distension. There is no tenderness. There is no rebound and no guarding.  Musculoskeletal: He exhibits no edema.       Left foot  toes discolored.  Neurological: He is alert and oriented to  person, place, and time.  Skin: Skin is dry. He is not diaphoretic.     Assessment/Plan #1. Left foot ischemic toes - patient has been started IV heparin. Further recommendations per vascular surgeon. Patient will be kept n.p.o. past midnight in anticipation of procedure. #2. Diabetes mellitus type 2 - patient is placed on slight scale coverage. #3. Hypertension - continue home medications. #4. CAD status post CABG - denies any chest pain. #5. Tobacco abuse - strongly advised to quit smoking. #6. History of pacemaker placement for bradycardia.  CODE STATUS - full code.  Eduard Clos. 02/18/2012, 10:05 PM

## 2012-02-18 NOTE — Progress Notes (Signed)
ANTICOAGULATION CONSULT NOTE - Initial Consult  Pharmacy Consult for UFH Indication: PVD  Allergies  Allergen Reactions  . Imdur (Isosorbide Mononitrate)     HEADACHES   . Metformin And Related     ABDOMINAL CRAMPS    Patient Measurements: Height: 5\' 6"  (167.6 cm) Weight: 178 lb (80.74 kg) IBW/kg (Calculated) : 63.8  Heparin Dosing Weight: 80kg  Vital Signs: Temp: 97.9 F (36.6 C) (12/16 1413) Temp src: Oral (12/16 1413) BP: 135/81 mmHg (12/16 2037) Pulse Rate: 70  (12/16 2037)  Labs:  Basename 02/18/12 1924 02/18/12 1743 02/18/12 1420  HGB -- 15.3 15.1  HCT -- 45.0 43.6  PLT -- -- 247  APTT 29 -- --  LABPROT 13.8 -- --  INR 1.07 -- --  HEPARINUNFRC -- -- --  CREATININE -- 1.30 --  CKTOTAL -- -- --  CKMB -- -- --  TROPONINI -- -- --    Estimated Creatinine Clearance: 58.1 ml/min (by C-G formula based on Cr of 1.3).   Medical History: Past Medical History  Diagnosis Date  . Cerebellar stroke, acute   . IHD (ischemic heart disease)   . Hiccoughs   . Diabetes mellitus   . Hyperlipidemia   . Vitamin B12 deficiency   . DIABETES MELLITUS, TYPE II 09/20/2008    Qualifier: Diagnosis of  By: Flonnie Overman    . HYPERTENSION 09/20/2008    Qualifier: Diagnosis of  By: Flonnie Overman    . CAD 09/20/2008    Qualifier: Diagnosis of  By: Flonnie Overman    . BRADYCARDIA 09/20/2008    Qualifier: Diagnosis of  By: Flonnie Overman    . 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 TIA 09/20/2008    Qualifier: Diagnosis of  By: Flonnie Overman    . PSORIASIS 09/20/2008    Qualifier: Diagnosis of  By: Flonnie Overman    . Cardiac pacemaker in situ 10/08/2008    Qualifier: Diagnosis of  By: Ladona Ridgel, MD, Jerrell Mylar   . Benign hypertensive heart disease without heart failure 08/03/2010  . Hx of CABG 08/03/2010  . Intractable hiccoughs 08/03/2010  . Tobacco abuse 08/03/2010  . Vascular claudication 11/15/2010  . Dyslipidemia  09/05/2011  . GERD (gastroesophageal reflux disease) 11/06/2011  . Nephrolithiasis 11/06/2011  . Carotid stenosis 11/06/2011    Bilat 60-80% 2010    Medications:   (Not in a hospital admission)  Assessment: 66 y/o male patient admitted with left leg weakness, US demonstrates significant plaques requiring anticoagulation for lower left leg ischemia.  Goal of Therapy:  Heparin level 0.3-0.7 units/ml Monitor platelets by anticoagulation protocol: Yes   Plan:  Heparin 5000 unit IV bolus followed by infusion at 1300 units/hr and check 6 hour heparin level. Daily cbc and heparin level.  Verlene Mayer, PharmD, BCPS Pager [phone] 02/18/2012,8:56 PM

## 2012-02-18 NOTE — Telephone Encounter (Signed)
plz return call to Junious Dresser [phone] regarding pt, baby toe has turned black and leg is giving out can hardly walk.  plz return call to [facility] who said pt is in pain.

## 2012-02-18 NOTE — ED Notes (Signed)
MD Knapp at bedside 

## 2012-02-18 NOTE — ED Notes (Signed)
PT reports leg pain started today . Pt also reports weakness of Lt leg. Repots almost falling this am due to weakness of LT leg. Pt sent here from PCP's office.for possible DVT. Pt denies swelling or redness to Lt leg.

## 2012-02-18 NOTE — ED Provider Notes (Signed)
History   This chart was scribed for Ward Givens, MD by Gerlean Ren, ED Scribe. This patient was seen in room TR11C/TR11C and the patient's care was started at 7:15 PM    CSN: [id number]  Arrival date & time 02/18/12  1401   First MD Initiated Contact with Patient 02/18/12 1827    Pt undressing at 18:30, then had doppler US done, actually seen at 19:15    Chief Complaint  Patient presents with  . Leg Pain     The history is provided by the patient. No language interpreter was used.   [name] is a 66 y.o. male with h/o CVA, MI, DM, HTN, CAD, who presents to the Emergency Department complaining of 2-3 weeks of left leg pain that worsened today with associated color change in  left toes first noticed 2-3 days ago.  Pt states that pain in left thigh "like it was on fire" and  has caused weakness and left leg has given out while ambulating.  Pt denies fever.  Pt has 4 stents in heart, 3 of which have closed.  Pt is a current everyday smoker but denies alcohol use.    PCP is Dr. Oliver Barre. Cardiologist is Dr. Patty Sermons.   Past Medical History  Diagnosis Date  . Cerebellar stroke, acute   . IHD (ischemic heart disease)   . Hiccoughs   . Diabetes mellitus   . Hyperlipidemia   . Vitamin B12 deficiency   . DIABETES MELLITUS, TYPE II 09/20/2008    Qualifier: Diagnosis of  By: Flonnie Overman    . HYPERTENSION 09/20/2008    Qualifier: Diagnosis of  By: Flonnie Overman    . CAD 09/20/2008    Qualifier: Diagnosis of  By: Flonnie Overman    . BRADYCARDIA 09/20/2008    Qualifier: Diagnosis of  By: Flonnie Overman    . 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 TIA 09/20/2008    Qualifier: Diagnosis of  By: Flonnie Overman    . PSORIASIS 09/20/2008    Qualifier: Diagnosis of  By: Flonnie Overman    . Cardiac pacemaker in situ 10/08/2008    Qualifier: Diagnosis of  By: Ladona Ridgel, MD, Jerrell Mylar   . Benign hypertensive heart disease  without heart failure 08/03/2010  . Hx of CABG 08/03/2010  . Intractable hiccoughs 08/03/2010  . Tobacco abuse 08/03/2010  . Vascular claudication 11/15/2010  . Dyslipidemia 09/05/2011  . GERD (gastroesophageal reflux disease) 11/06/2011  . Nephrolithiasis 11/06/2011  . Carotid stenosis 11/06/2011    Bilat 60-80% 2010    Past Surgical History  Procedure Date  . Coronary artery bypass graft   . Coronary angioplasty 09/18/2006    WELL PRESERVED LEFT VENTRICULAR SYSTOLIC FUNCTION. THERE APPEARS TBE SOME HYPOKINESIS OF THE POSTERIOR WALL. EF 60%    Family History  Problem Relation Age of Onset  . COPD Mother   . Coronary artery disease Father     History  Substance Use Topics  . Smoking status: Current Every Day Smoker -- 1.0 packs/day    Types: Cigarettes  . Smokeless tobacco: Former Neurosurgeon  . Alcohol Use: No   Lives at home    Review of Systems  Constitutional: Negative for fever.  Musculoskeletal:       Pain in left leg.  Skin: Positive for color change.  Neurological: Positive for weakness.  All other systems reviewed and are negative.    Allergies  Imdur and Metformin and related  Home Medications   Current Outpatient Rx  Name  Route  Sig  Dispense  Refill  . ASPIRIN 81 MG PO TABS   Oral   Take 81 mg by mouth daily.           . ATORVASTATIN CALCIUM 80 MG PO TABS   Oral   Take 1 tablet (80 mg total) by mouth daily.   90 tablet   3   . BACLOFEN 10 MG PO TABS   Oral   Take 10 mg by mouth Daily.         . CHLORPROMAZINE HCL 25 MG PO TABS   Oral   Take 25 mg by mouth daily.         . DEXLANSOPRAZOLE 60 MG PO CPDR   Oral   Take 1 capsule (60 mg total) by mouth daily.   90 capsule   3   . EZETIMIBE 10 MG PO TABS   Oral   Take 1 tablet (10 mg total) by mouth daily.   30 tablet   11   . GLIMEPIRIDE 4 MG PO TABS   Oral   Take 1 tablet (4 mg total) by mouth daily before breakfast.   30 tablet   11   . LISINOPRIL-HYDROCHLOROTHIAZIDE 20-12.5 MG PO  TABS   Oral   Take 1 tablet by mouth daily.   30 tablet   11   . METOPROLOL SUCCINATE ER 100 MG PO TB24   Oral   Take 100 mg by mouth daily. Take with or immediately following a meal.         . NITROGLYCERIN 0.4 MG SL SUBL   Sublingual   Place 0.4 mg under the tongue every 5 (five) minutes as needed. For chest pain         . PANTOPRAZOLE SODIUM 40 MG PO TBEC   Oral   Take 40 mg by mouth daily.         Marland Kitchen RANITIDINE HCL 150 MG PO TABS      TAKE 1 TABLET BY MOUTH TWICE A DAY   60 tablet   7     BP 142/96  Pulse 74  Temp 97.9 F (36.6 C) (Oral)  Resp 18  Ht 5\' 6"  (1.676 m)  Wt 178 lb (80.74 kg)  BMI 28.73 kg/m2  SpO2 100%  Vital signs normal    Physical Exam  Nursing note and vitals reviewed. Constitutional: He is oriented to person, place, and time. He appears well-developed and well-nourished.  Non-toxic appearance. He does not appear ill. No distress.  HENT:  Head: Normocephalic and atraumatic.  Right Ear: External ear normal.  Left Ear: External ear normal.  Nose: Nose normal. No mucosal edema or rhinorrhea.  Mouth/Throat: Mucous membranes are normal. No dental abscesses or uvula swelling.  Eyes: Conjunctivae normal and EOM are normal.  Neck: Normal range of motion and full passive range of motion without pain. Neck supple.  Cardiovascular: Normal rate, regular rhythm and normal heart sounds.  Exam reveals no gallop and no friction rub.   No murmur heard. Pulmonary/Chest: Effort normal and breath sounds normal. No respiratory distress. He has no wheezes. He has no rhonchi. He has no rales. He exhibits no tenderness and no crepitus.  Abdominal: Normal appearance.  Musculoskeletal: Normal range of motion. He exhibits no edema and no tenderness.       Warm to just above the ankle, below ankle cold to touch, bluish discoloration of  entire left little toe and of distal portion of left third, fourth, and fifth toes.  Area of distal left foot around little toe  that is also discolored.  Neurological: He is alert and oriented to person, place, and time. He has normal strength. No cranial nerve deficit.  Skin: Skin is dry and intact. No rash noted. No erythema. No pallor.  Psychiatric: He has a normal mood and affect. His speech is normal and behavior is normal. His mood appears not anxious.    ED Course  Procedures (including critical care time)   Medications  chlorproMAZINE (THORAZINE) 25 MG tablet (not administered)  0.9 %  sodium chloride infusion (  Intravenous New Bag/Given 02/18/12 1943)  morphine 4 MG/ML injection 4 mg (4 mg Intravenous Given 02/18/12 1945)  ondansetron (ZOFRAN) injection 4 mg (4 mg Intravenous Given 02/18/12 1943)  Pt started on Heparin Drip  DIAGNOSTIC STUDIES: Oxygen Saturation is 100% on room air, normal by my interpretation.    COORDINATION OF CARE:   7:18 PM- Left room to take consult phone call.  7:23 PM- Re-entered room.   7:25 PM- Patient informed of clinical course, understands medical decision-making process, and agrees with plan.   Author:  Gwendolyn Fill  Service:  Vascular Lab  Author Type:  Cardiovascular Sonographer   Filed:  02/18/12 1922  Note Time:  02/18/12 1920          *Preliminary Results*  Left lower extremity venous duplex completed.  Left lower extremity is negative for deep vein thrombosis. No evidence of left Baker's cyst.  Incidental finding: Left lower extremity arteries have evidence of significant plaque morphology, with common femoral and femoral arteries demonstrating monophasic waveforms, proximal posterior tibial artery demonstrates dampened monophasic waveforms.  Preliminary results discussed with Dr.Neal Oshea.  02/18/2012 7:22 PM  Gertie Fey, RDMS, RDCS       20:31 Dr Claudie Fisherman will see patient in ED but wants medicine to admit. States to start heparin.   21:05 Dr Toniann Fail admit to tele, team 10  Results for orders placed during the hospital encounter of  02/18/12  CBC WITH DIFFERENTIAL      Component Value Range   WBC 8.8  4.0 - 10.5 K/uL   RBC 5.05  4.22 - 5.81 MIL/uL   Hemoglobin 15.1  13.0 - 17.0 g/dL   HCT 16.1  09.6 - 04.5 %   MCV 86.3  78.0 - 100.0 fL   MCH 29.9  26.0 - 34.0 pg   MCHC 34.6  30.0 - 36.0 g/dL   RDW 40.9  81.1 - 91.4 %   Platelets 247  150 - 400 K/uL   Neutrophils Relative 76  43 - 77 %   Neutro Abs 6.7  1.7 - 7.7 K/uL   Lymphocytes Relative 11 (*) 12 - 46 %   Lymphs Abs 1.0  0.7 - 4.0 K/uL   Monocytes Relative 10  3 - 12 %   Monocytes Absolute 0.9  0.1 - 1.0 K/uL   Eosinophils Relative 1  0 - 5 %   Eosinophils Absolute 0.1  0.0 - 0.7 K/uL   Basophils Relative 2 (*) 0 - 1 %   Basophils Absolute 0.2 (*) 0.0 - 0.1 K/uL  POCT I-STAT, CHEM 8      Component Value Range   Sodium 137  135 - 145 mEq/L   Potassium 3.6  3.5 - 5.1 mEq/L   Chloride 98  96 - 112 mEq/L   BUN 8  6 - 23  mg/dL   Creatinine, Ser 4.09  0.50 - 1.35 mg/dL   Glucose, Bld 811 (*) 70 - 99 mg/dL   Calcium, Ion 9.14  7.82 - 1.30 mmol/L   TCO2 29  0 - 100 mmol/L   Hemoglobin 15.3  13.0 - 17.0 g/dL   HCT 95.6  21.3 - 08.6 %  APTT      Component Value Range   aPTT 29  24 - 37 seconds  PROTIME-INR      Component Value Range   Prothrombin Time 13.8  11.6 - 15.2 seconds   INR 1.07  0.00 - 1.49   Laboratory interpretation all normal except hyperglycemia     1. Peripheral vascular disease   2. Ischemic toe     Plan admission  CRITICAL CARE Performed by: Devoria Albe L   Total critical care time: 36 min   Critical care time was exclusive of separately billable procedures and treating other patients.  Critical care was necessary to treat or prevent imminent or life-threatening deterioration.  Critical care was time spent personally by me on the following activities: development of treatment plan with patient and/or surrogate as well as nursing, discussions with consultants, evaluation of patient's response to treatment, examination of  patient, obtaining history from patient or surrogate, ordering and performing treatments and interventions, ordering and review of laboratory studies, ordering and review of radiographic studies, pulse oximetry and re-evaluation of patient's condition.    MDM   I personally performed the services described in this documentation, which was scribed in my presence. The recorded information has been reviewed and considered.  Devoria Albe, MD, Armando Gang         Ward Givens, MD 02/18/12 2106

## 2012-02-18 NOTE — Progress Notes (Signed)
*  Preliminary Results* Left lower extremity venous duplex completed. Left lower extremity is negative for deep vein thrombosis. No evidence of left Baker's cyst. Incidental finding: Left lower extremity arteries have evidence of significant plaque morphology, with common femoral and femoral arteries demonstrating monophasic waveforms, proximal posterior tibial artery demonstrates dampened monophasic waveforms.  Preliminary results discussed with Dr.Knapp.  02/18/2012 7:22 PM Gertie Fey, RDMS, RDCS

## 2012-02-18 NOTE — Consult Note (Addendum)
[facility] [place]  Referred by:  Dr. Lynelle Doctor [facility] ED)  Reason for referral: Left foot ischemia  History of Present Illness  [name] is a 66 y.o. (10/16/1945) male who presents with chief complaint: left leg pain.  Onset of symptom occurred years ago, though claims foot pain only started few weeks ago and left leg pain today.  His cardiologist documents claudication not well managed medically due to continued smoking.  Pain is described as "hurts", severity 5-10/10, and associated with rest and movement.  Patient has attempted to treat this pain with rest.  The patient has rest pain symptoms also and no leg wounds/ulcers.  Atherosclerotic risk factors include: hyperlipidemia, DM, HTN, and current active smoker.  Past Medical History  Diagnosis Date  . Cerebellar stroke, acute   . IHD (ischemic heart disease)   . Hiccoughs   . Diabetes mellitus   . Hyperlipidemia   . Vitamin B12 deficiency   . DIABETES MELLITUS, TYPE II 09/20/2008    Qualifier: Diagnosis of  By: Flonnie Overman    . HYPERTENSION 09/20/2008    Qualifier: Diagnosis of  By: Flonnie Overman    . CAD 09/20/2008    Qualifier: Diagnosis of  By: Flonnie Overman    . BRADYCARDIA 09/20/2008    Qualifier: Diagnosis of  By: Flonnie Overman    . 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 TIA 09/20/2008    Qualifier: Diagnosis of  By: Flonnie Overman    . PSORIASIS 09/20/2008    Qualifier: Diagnosis of  By: Flonnie Overman    . Cardiac pacemaker in situ 10/08/2008    Qualifier: Diagnosis of  By: Ladona Ridgel, MD, Jerrell Mylar   . Benign hypertensive heart disease without heart failure 08/03/2010  . Hx of CABG 08/03/2010  . Intractable hiccoughs 08/03/2010  . Tobacco abuse 08/03/2010  . Vascular claudication 11/15/2010  . Dyslipidemia 09/05/2011  . GERD (gastroesophageal reflux disease) 11/06/2011  . Nephrolithiasis 11/06/2011  . Carotid stenosis 11/06/2011    Bilat  60-80% 2010    Past Surgical History  Procedure Date  . Coronary artery bypass graft   . Coronary angioplasty 09/18/2006    WELL PRESERVED LEFT VENTRICULAR SYSTOLIC FUNCTION. THERE APPEARS TBE SOME HYPOKINESIS OF THE POSTERIOR WALL. EF 60%    History   Social History  . Marital Status: Single    Spouse Name: N/A    Number of Children: N/A  . Years of Education: N/A   Occupational History  . Not on file.   Social History Main Topics  . Smoking status: Current Every Day Smoker -- 1.0 packs/day    Types: Cigarettes  . Smokeless tobacco: Former Neurosurgeon  . Alcohol Use: No  . Drug Use: No  . Sexually Active: Not on file   Other Topics Concern  . Not on file   Social History Narrative  . No narrative on file    Family History  Problem Relation Age of Onset  . COPD Mother   . Coronary artery disease Father     No current facility-administered medications on file prior to encounter.   Current Outpatient Prescriptions on File Prior to Encounter  Medication Sig Dispense Refill  . aspirin 81 MG tablet Take 81 mg by mouth daily.        Marland Kitchen atorvastatin (LIPITOR) 80 MG tablet Take 1 tablet (80 mg total) by mouth daily.  90 tablet  3  .  baclofen (LIORESAL) 10 MG tablet Take 10 mg by mouth Daily.      Marland Kitchen dexlansoprazole (DEXILANT) 60 MG capsule Take 1 capsule (60 mg total) by mouth daily.  90 capsule  3  . ezetimibe (ZETIA) 10 MG tablet Take 1 tablet (10 mg total) by mouth daily.  30 tablet  11  . glimepiride (AMARYL) 4 MG tablet Take 1 tablet (4 mg total) by mouth daily before breakfast.  30 tablet  11  . lisinopril-hydrochlorothiazide (PRINZIDE,ZESTORETIC) 20-12.5 MG per tablet Take 1 tablet by mouth daily.  30 tablet  11  . metoprolol succinate (TOPROL-XL) 100 MG 24 hr tablet Take 100 mg by mouth daily. Take with or immediately following a meal.      . nitroGLYCERIN (NITROSTAT) 0.4 MG SL tablet Place 0.4 mg under the tongue every 5 (five) minutes as needed. For chest pain      .  pantoprazole (PROTONIX) 40 MG tablet Take 40 mg by mouth daily.      . ranitidine (ZANTAC) 150 MG tablet TAKE 1 TABLET BY MOUTH TWICE A DAY  60 tablet  7    Allergies  Allergen Reactions  . Imdur (Isosorbide Mononitrate)     HEADACHES   . Metformin And Related     ABDOMINAL CRAMPS    REVIEW OF SYSTEMS:  (Positives checked otherwise negative)  CARDIOVASCULAR:  [ ]  chest pain, [ ]  chest pressure, [ ]  palpitations, [ ]  shortness of breath when laying flat, [ ]  shortness of breath with exertion,   [x]  pain in feet when walking, [x]  pain in feet when laying flat, [ ]  history of blood clot in veins (DVT), [ ]  history of phlebitis, [ ]  swelling in legs, [ ]  varicose veins  PULMONARY:  [ ]  productive cough, [ ]  asthma, [ ]  wheezing  NEUROLOGIC:  [ ]  weakness in arms or legs, [ ]  numbness in arms or legs, [ ]  difficulty speaking or slurred speech, [ ]  temporary loss of vision in one eye, [x]  dizziness, [x]  hiccups  HEMATOLOGIC:  [ ]  bleeding problems, [ ]  problems with blood clotting too easily  MUSCULOSKEL:  [ ]  joint pain, [ ]  joint swelling  GASTROINTEST:  [ ]   Vomiting blood, [ ]   Blood in stool     GENITOURINARY:  [ ]   Burning with urination, [ ]   Blood in urine  PSYCHIATRIC:  [ ]  history of major depression  INTEGUMENTARY:  [ ]  rashes, [ ]  ulcers  CONSTITUTIONAL:  [ ]  fever, [ ]  chills   Physical Examination  Filed Vitals:   02/18/12 1413 02/18/12 2037  BP: 142/96 135/81  Pulse: 74 70  Temp: 97.9 F (36.6 C)   TempSrc: Oral   Resp: 18 14  Height: 5\' 6"  (1.676 m)   Weight: 178 lb (80.74 kg)   SpO2: 100% 98%   Body mass index is 28.73 kg/(m^2).  General: A&O x 3, WDWN  Head: [place]/AT  Ear/Nose/Throat: Hearing grossly intact, nares w/o erythema or drainage, oropharynx w/o Erythema/Exudate   Eyes: PERRLA, EOMI  Neck: Supple, no nuchal rigidity, no palpable LAD  Pulmonary: Sym exp, good air movt, CTAB, no rales, rhonchi, & wheezing  Cardiac: RRR, Nl S1, S2, no  Murmurs, rubs or gallops  Vascular: Vessel Right Left  Radial Palpable Palpable  Ulnar Palpable Palpable  Brachial Palpable Palpable  Carotid Palpable, without bruit Palpable, without bruit  Aorta Not palpable N/A  Femoral Palpable Palpable  Popliteal Not palpable Not palpable  PT Not Palpable  Not Palpable  DP Not Palpable Not Palpable   Gastrointestinal: soft, NTND, -G/R, - HSM, - masses, - CVAT B  Musculoskeletal: M/S 5/5 throughout , Extremities without ischemic changes except  L 5th toe cyanotic  Neurologic: CN 2-12 grossly intact , Pain and light touch intact in extremities , Motor exam as listed above  Psychiatric: Judgment intact, Mood & affect appropriate for pt's clinical situation  Dermatologic: See M/S exam for extremity exam, no rashes otherwise noted  Lymph : No Cervical, Axillary, or Inguinal lymphadenopathy   Laboratory: CBC:    Component Value Date/Time   WBC 8.8 02/18/2012 1420   RBC 5.05 02/18/2012 1420   HGB 15.3 02/18/2012 1743   HCT 45.0 02/18/2012 1743   PLT 247 02/18/2012 1420   MCV 86.3 02/18/2012 1420   MCH 29.9 02/18/2012 1420   MCHC 34.6 02/18/2012 1420   RDW 14.8 02/18/2012 1420   LYMPHSABS 1.0 02/18/2012 1420   MONOABS 0.9 02/18/2012 1420   EOSABS 0.1 02/18/2012 1420   BASOSABS 0.2* 02/18/2012 1420    BMP:    Component Value Date/Time   NA 137 02/18/2012 1743   K 3.6 02/18/2012 1743   CL 98 02/18/2012 1743   CO2 28 02/07/2012 1002   GLUCOSE 182* 02/18/2012 1743   BUN 8 02/18/2012 1743   CREATININE 1.30 02/18/2012 1743   CALCIUM 9.4 02/07/2012 1002   GFRNONAA 58* 12/17/2008 1130   GFRAA  Value: >60        The eGFR has been calculated using the MDRD equation. This calculation has not been validated in all clinical situations. eGFR's persistently <60 mL/min signify possible Chronic Kidney Disease. 12/17/2008 1130    Coagulation: Lab Results  Component Value Date   INR 1.07 02/18/2012   INR 1.2 05/21/2008   INR 1.0 05/17/2008    No results found for this basename: PTT    Non-Invasive Vascular Imaging  LLE Venous Duplex (Date: 02/18/12)  Left lower extremity venous duplex completed.   Left lower extremity is negative for deep vein thrombosis. No evidence of left Baker's cyst.   Incidental finding: Left lower extremity arteries have evidence of significant plaque morphology, with common femoral and femoral arteries demonstrating monophasic waveforms, proximal posterior tibial artery demonstrates dampened monophasic waveforms.   Medical Decision Making  [name] is a 66 y.o. male who presents with: LLE critical limb ischemia in setting of chronic PAD, significant CAD, DM, HTN, Hyperlipidemia, CVA.   Patient has intact motor with evidence of dopplerable tibial arterial flow, so he does NOT have a threatened limb, so immediate intervention overnight is not necessary.  Admit to Hospitalist service for medical optimization.  Likely will require Cardiology preop evaluation also.  Keep patient on Heparin drip for now to limit propagation of thrombus, though I suspect this patient has chronic atherosclerosis in both legs so any thrombus is likely in-situ.  I discussed with the patient the natural history of critical limb ischemia: 25% require amputation in one year, 50% are able to maintain their limbs in one year, and 25-30% die in one year due to comorbidities.  Given the limb threatening status of this patient, I recommend an aggressive work up including proceeding with an: Aortogram, Bilateral runoff and intervention. I discussed with the patient the nature of angiographic procedures, especially the limited patencies of any endovascular intervention. The patient is aware of that the risks of an angiographic procedure include but are not limited to: bleeding, infection, access site complications, embolization, rupture of treated  vessel, dissection, possible need for emergent surgical intervention, and  possible need for surgical procedures to treat the patient's pathology. The patient is aware of the risks and agrees to proceed.  The procedure is scheduled for: Tues/Wed. Keep patient NPO overnight to facilitate the angiogram. Baseline ABI and BLE Vein Mapping should be obtained.  I discussed in depth with the patient the nature of atherosclerosis, and emphasized the importance of maximal medical management including strict control of blood pressure, blood glucose, and lipid levels, antiplatelet agents, obtaining regular exercise, and cessation of smoking.  The patient is aware that without maximal medical management the underlying atherosclerotic disease process will progress, limiting the benefit of any interventions.  Thank you for allowing [place] to participate in this patient's care.  Leonides Sake, MD [facility] [place] Office: [phone] Pager: [phone]  02/18/2012, 10:20 PM

## 2012-02-19 ENCOUNTER — Encounter (HOSPITAL_COMMUNITY)
Admission: EM | Disposition: A | Discharge status: Home / Self Care | Payer: Self-pay | Source: Home / Self Care | Attending: Internal Medicine

## 2012-02-19 DIAGNOSIS — Z01810 Encounter for preprocedural cardiovascular examination: Secondary | ICD-10-CM

## 2012-02-19 DIAGNOSIS — I10 Essential (primary) hypertension: Secondary | ICD-10-CM

## 2012-02-19 DIAGNOSIS — E119 Type 2 diabetes mellitus without complications: Secondary | ICD-10-CM

## 2012-02-19 DIAGNOSIS — N183 Chronic kidney disease, stage 3 unspecified: Secondary | ICD-10-CM | POA: Diagnosis present

## 2012-02-19 DIAGNOSIS — I739 Peripheral vascular disease, unspecified: Secondary | ICD-10-CM

## 2012-02-19 DIAGNOSIS — I70229 Atherosclerosis of native arteries of extremities with rest pain, unspecified extremity: Secondary | ICD-10-CM

## 2012-02-19 HISTORY — PX: LOWER EXTREMITY ANGIOGRAM: SHX5508

## 2012-02-19 LAB — BASIC METABOLIC PANEL
Calcium: 9.1 mg/dL (ref 8.4–10.5)
GFR calc non Af Amer: 46 mL/min — ABNORMAL LOW (ref >90)
Sodium: 141 mEq/L (ref 135–145)

## 2012-02-19 LAB — CBC
Hemoglobin: 14.3 g/dL (ref 13.0–17.0)
MCV: 85.6 fL (ref 78.0–100.0)
Platelets: 202 10*3/uL (ref 150–400)
RBC: 4.83 MIL/uL (ref 4.22–5.81)
RDW: 14.6 % (ref 11.5–15.5)
WBC: 7.1 10*3/uL (ref 4.0–10.5)
WBC: 7.7 10*3/uL (ref 4.0–10.5)

## 2012-02-19 LAB — HEPARIN LEVEL (UNFRACTIONATED): Heparin Unfractionated: 0.44 IU/mL (ref 0.30–0.70)

## 2012-02-19 LAB — CREATININE, SERUM
Creatinine, Ser: 1.34 mg/dL (ref 0.50–1.35)
GFR calc Af Amer: 64 mL/min — ABNORMAL LOW (ref >90)
GFR calc non Af Amer: 55 mL/min — ABNORMAL LOW (ref >90)

## 2012-02-19 LAB — GLUCOSE, CAPILLARY

## 2012-02-19 SURGERY — ANGIOGRAM, LOWER EXTREMITY
Anesthesia: LOCAL

## 2012-02-19 MED ORDER — HEPARIN (PORCINE) IN NACL 2-0.9 UNIT/ML-% IJ SOLN
INTRAMUSCULAR | Status: AC
  Filled 2012-02-19: qty 1000

## 2012-02-19 MED ORDER — ENOXAPARIN SODIUM 40 MG/0.4ML ~~LOC~~ SOLN
40.0000 mg | SUBCUTANEOUS | Status: DC
  Administered 2012-02-19: 40 mg via SUBCUTANEOUS
  Filled 2012-02-19 (×2): qty 0.4

## 2012-02-19 MED ORDER — ENOXAPARIN SODIUM 40 MG/0.4ML ~~LOC~~ SOLN
40.0000 mg | SUBCUTANEOUS | Status: DC
  Filled 2012-02-19: qty 0.4

## 2012-02-19 MED ORDER — ASPIRIN 81 MG PO CHEW
81.0000 mg | CHEWABLE_TABLET | Freq: Every day | ORAL | Status: DC
  Administered 2012-02-19 – 2012-02-20 (×2): 81 mg via ORAL
  Filled 2012-02-19 (×2): qty 1

## 2012-02-19 MED ORDER — FAMOTIDINE IN NACL 20-0.9 MG/50ML-% IV SOLN
INTRAVENOUS | Status: AC
  Filled 2012-02-19: qty 50

## 2012-02-19 MED ORDER — DIPHENHYDRAMINE HCL 50 MG/ML IJ SOLN
INTRAMUSCULAR | Status: AC
  Filled 2012-02-19: qty 1

## 2012-02-19 MED ORDER — LIDOCAINE HCL (PF) 1 % IJ SOLN
INTRAMUSCULAR | Status: AC
  Filled 2012-02-19: qty 30

## 2012-02-19 MED ORDER — ENOXAPARIN SODIUM 40 MG/0.4ML ~~LOC~~ SOLN: 40.0000 mg | SUBCUTANEOUS | Status: DC

## 2012-02-19 MED ORDER — ACETAMINOPHEN 325 MG PO TABS: 650.0000 mg | ORAL_TABLET | ORAL | Status: DC | PRN

## 2012-02-19 MED ORDER — OXYCODONE-ACETAMINOPHEN 5-325 MG PO TABS: 1.0000 | ORAL_TABLET | ORAL | Status: DC | PRN

## 2012-02-19 MED ORDER — MORPHINE SULFATE 2 MG/ML IJ SOLN: 2.0000 mg | INTRAMUSCULAR | Status: DC | PRN

## 2012-02-19 MED ORDER — LISINOPRIL 20 MG PO TABS
20.0000 mg | ORAL_TABLET | Freq: Every day | ORAL | Status: DC
  Administered 2012-02-19 – 2012-02-20 (×2): 20 mg via ORAL
  Filled 2012-02-19 (×2): qty 1

## 2012-02-19 MED ORDER — METHYLPREDNISOLONE SODIUM SUCC 125 MG IJ SOLR
INTRAMUSCULAR | Status: AC
Start: 2012-02-19 — End: 2012-02-19
  Filled 2012-02-19: qty 2

## 2012-02-19 MED ORDER — ONDANSETRON HCL 4 MG/2ML IJ SOLN: 4.0000 mg | Freq: Four times a day (QID) | INTRAMUSCULAR | Status: DC | PRN

## 2012-02-19 MED ORDER — HYDROCHLOROTHIAZIDE 12.5 MG PO CAPS
12.5000 mg | ORAL_CAPSULE | Freq: Every day | ORAL | Status: DC
  Administered 2012-02-19 – 2012-02-20 (×2): 12.5 mg via ORAL
  Filled 2012-02-19 (×2): qty 1

## 2012-02-19 MED ORDER — INSULIN ASPART 100 UNIT/ML ~~LOC~~ SOLN
0.0000 [IU] | Freq: Every day | SUBCUTANEOUS | Status: DC
  Administered 2012-02-19: 2 [IU] via SUBCUTANEOUS

## 2012-02-19 MED ORDER — INSULIN ASPART 100 UNIT/ML ~~LOC~~ SOLN
0.0000 [IU] | Freq: Three times a day (TID) | SUBCUTANEOUS | Status: DC
  Administered 2012-02-20 (×2): 3 [IU] via SUBCUTANEOUS

## 2012-02-19 NOTE — Progress Notes (Signed)
Pt removed IV and took off telemetry monitor.  Pulled gown off and was looking for his clothes.  Called MD to make aware.  Pt will not allow IV to be reinserted at this time.  MD wants RN to try again later.  If pt has worsened agitation or confusion notify the PM hospitalist for possible CT scan. [name]

## 2012-02-19 NOTE — Progress Notes (Signed)
ANTICOAGULATION CONSULT NOTE  Pharmacy Consult for UFH Indication: PVD  Allergies  Allergen Reactions  . Imdur (Isosorbide Mononitrate)     HEADACHES   . Metformin And Related     ABDOMINAL CRAMPS    Patient Measurements: Height: 5\' 6"  (167.6 cm) Weight: 167 lb 5.3 oz (75.9 kg) IBW/kg (Calculated) : 63.8  Heparin Dosing Weight: 80kg  Vital Signs: Temp: 98.4 F (36.9 C) (12/17 0425) Temp src: Oral (12/17 0425) BP: 119/66 mmHg (12/17 0425) Pulse Rate: 70  (12/17 0425)  Labs:  Basename 02/19/12 0450 02/18/12 1924 02/18/12 1743 02/18/12 1420  HGB 14.3 -- 15.3 --  HCT 42.0 -- 45.0 43.6  PLT 204 -- -- 247  APTT -- 29 -- --  LABPROT -- 13.8 -- --  INR -- 1.07 -- --  HEPARINUNFRC 0.44 -- -- --  CREATININE 1.56* -- 1.30 --  CKTOTAL -- -- -- --  CKMB -- -- -- --  TROPONINI -- -- -- --    Estimated Creatinine Clearance: 43.7 ml/min (by C-G formula based on Cr of 1.56).  Assessment: 66 y/o male PVD/leg ischemia for Heparin   Goal of Therapy:  Heparin level 0.3-0.7 units/ml Monitor platelets by anticoagulation protocol: Yes   Plan: Continue Heparin at current rate  F/U after arteriogram today   Geannie Risen, PharmD, BCPS  02/19/2012,6:53 AM

## 2012-02-19 NOTE — Progress Notes (Signed)
INITIAL NUTRITION ASSESSMENT  DOCUMENTATION CODES Per approved criteria  -Non-severe (moderate) malnutrition in the context of chronic illness   INTERVENTION:  Recommend Carbohydrate Modified High Calorie diet RD to follow for nutrition care plan, add interventions accordingly  NUTRITION DIAGNOSIS: Unintended weight loss related to inadequate oral intake as evidenced by 10% loss x 6 months  Goal: Oral intake with meals to meet >/= 90% of estimated nutrition needs  Monitor:  PO intake, weight, labs, I/O's  Reason for Assessment: Malnutrition Screening Tool Report  66 y.o. male  Admitting Dx: Ischemic leg  ASSESSMENT: Patient admitted after starting to notice discoloration of his left little toe 2 days ago; pain started to slowly worsen and other toes also got discolored; Dopplers showed poor blood flow and vascular surgeon consulted.  RD spoke with patient and patient's daughter at bedside this AM; patient reports a 19 lb weight loss in the past 6 months (10%); daughter reports patient at times goes days without eating due to not feeling well; no % meal intake available yet.  Patient meets criteria for non-severe (moderate) malnutrition in the context of chronic illness given intake < 75% of estimated energy requirement for > 1 month and 10% weight loss x 6 months.  Height: Ht Readings from Last 1 Encounters:  02/18/12 5\' 6"  (1.676 m)    Weight: Wt Readings from Last 1 Encounters:  02/18/12 167 lb 5.3 oz (75.9 kg)    Ideal Body Weight: 64.5 kg  % Ideal Body Weight: 117%  Wt Readings from Last 10 Encounters:  02/18/12 167 lb 5.3 oz (75.9 kg)  02/18/12 167 lb 5.3 oz (75.9 kg)  02/07/12 171 lb 4 oz (77.678 kg)  01/08/12 168 lb (76.204 kg)  11/06/11 172 lb 12 oz (78.359 kg)  09/05/11 176 lb (79.833 kg)  03/09/11 174 lb (78.926 kg)  11/15/10 185 lb (83.915 kg)  09/05/10 187 lb 6.4 oz (85.004 kg)  08/03/10 183 lb 3.2 oz (83.099 kg)    Usual Body Weight: 186  lb  % Usual Body Weight: 90%  BMI:  Body mass index is 27.01 kg/(m^2).  Estimated Nutritional Needs: Kcal: 1900-2100 Protein: 90-100 gm Fluid: 1.9-2.1 L  Skin: Intact  Diet Order: Carb Control -- advanced today 1351  EDUCATION NEEDS: -No education needs identified at this time   Intake/Output Summary (Last 24 hours) at 02/19/12 1414 Last data filed at 02/19/12 0730  Gross per 24 hour  Intake      0 ml  Output      0 ml  Net      0 ml    Last BM: 12/16  Labs:   Lab 02/19/12 0450 02/18/12 1743  NA 141 137  K 4.3 3.6  CL 102 98  CO2 29 --  BUN 9 8  CREATININE 1.56* 1.30  CALCIUM 9.1 --  MG -- --  PHOS -- --  GLUCOSE 116* 182*    CBG (last 3)   Basename 02/19/12 1242 02/19/12 0951 02/19/12 0547  GLUCAP 120* 103* 110*    Scheduled Meds:   . aspirin  81 mg Oral Daily  . atorvastatin  80 mg Oral q1800  . baclofen  10 mg Oral Daily  . chlorproMAZINE  25 mg Oral Daily  . enoxaparin (LOVENOX) injection  40 mg Subcutaneous Q24H  . ezetimibe  10 mg Oral Daily  . famotidine  20 mg Oral Daily  . glimepiride  4 mg Oral QAC breakfast  . hydrochlorothiazide  12.5 mg Oral Daily  .  insulin aspart  0-9 Units Subcutaneous TID WC  . lisinopril  20 mg Oral Daily  . metoprolol succinate  100 mg Oral Daily  . pantoprazole  40 mg Oral Daily  . sodium chloride  3 mL Intravenous Q12H    Continuous Infusions:   . sodium chloride 75 mL/hr at 02/19/12 0019  . sodium chloride      Past Medical History  Diagnosis Date  . Cerebellar stroke, acute   . IHD (ischemic heart disease)   . Hiccoughs   . Diabetes mellitus   . Hyperlipidemia   . Vitamin B12 deficiency   . DIABETES MELLITUS, TYPE II 09/20/2008    Qualifier: Diagnosis of  By: Flonnie Overman    . HYPERTENSION 09/20/2008    Qualifier: Diagnosis of  By: Flonnie Overman    . CAD 09/20/2008    Qualifier: Diagnosis of  By: Flonnie Overman    . BRADYCARDIA 09/20/2008    Qualifier: Diagnosis of  By: Flonnie Overman     . 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 TIA 09/20/2008    Qualifier: Diagnosis of  By: Flonnie Overman    . PSORIASIS 09/20/2008    Qualifier: Diagnosis of  By: Flonnie Overman    . Cardiac pacemaker in situ 10/08/2008    Qualifier: Diagnosis of  By: Ladona Ridgel, MD, Jerrell Mylar   . Benign hypertensive heart disease without heart failure 08/03/2010  . Hx of CABG 08/03/2010  . Intractable hiccoughs 08/03/2010  . Tobacco abuse 08/03/2010  . Vascular claudication 11/15/2010  . Dyslipidemia 09/05/2011  . GERD (gastroesophageal reflux disease) 11/06/2011  . Nephrolithiasis 11/06/2011  . Carotid stenosis 11/06/2011    Bilat 60-80% 2010    Past Surgical History  Procedure Date  . Coronary artery bypass graft   . Coronary angioplasty 09/18/2006    WELL PRESERVED LEFT VENTRICULAR SYSTOLIC FUNCTION. THERE APPEARS TBE SOME HYPOKINESIS OF THE POSTERIOR WALL. EF 60%    Kirkland Hun, RD, LDN Pager #: [phone] After-Hours Pager #: [phone]

## 2012-02-19 NOTE — Progress Notes (Signed)
*  PRELIMINARY RESULTS* Vascular Ultrasound Lower extremity vein mappin has been completed.    Right Lower Extremity Vein Map    Right Great Saphenous Vein   Segment Diameter Comment  1. Origin 5.63mm   2. High Thigh 3.36mm   3. Mid Thigh 2.30mm   4. Low Thigh 2.39mm   5. At Knee 2.57mm   6. High Calf 2.46mm   7. Low Calf 3.19mm branch  8. Ankle 2.77mm    mm    mm    mm        Left Lower Extremity Vein Map    Left Great Saphenous Vein   Segment Diameter Comment  1. Origin 3.11mm   2. High Thigh 2.84mm   3. Mid Thigh 2.22mm   4. Low Thigh 2.63mm   5. At Knee 2.84mm   6. High Calf 2.73mm   7. Low Calf 2.19mm   8. Ankle 2.35mm    mm      Farrel Demark, RDMS, RVT  02/19/2012, 11:59 AM

## 2012-02-19 NOTE — Progress Notes (Addendum)
[facility] PROGRESS NOTE  JARQUAVIOUS FENTRESS UJW:[id number] DOB: Sep 18, 1945 DOA: 02/18/2012 PCP: Oliver Barre, MD Primary cardiologist: Dr. [name]  HPI: 66 year-old male history of CAD status post CABG, hypertension, diabetes mellitus2, ongoing tobacco abuse, pacemaker, TIA/CVA, carotid disease, hyperlipidemia started noticing discoloration of his left little toe 2 days ago. Slowly started worsening pain and other toes also got discolored. At this point patient presented the ER today and had Dopplers done which showed poor blood flow and vascular surgeon on call Dr. Johny Drilling was consulted and patient has been admitted for further management. Patient also has been started IV heparin infusion. Otherwise patient denies any chest pain shortness of breath fever chills or any trauma.   Assessment/Plan:  Left lower extremity ischemia, secondary to peripheral artery disease.  Vascular surgery consulted.  Plans are for an aortogram, bilateral leg runoff and possible intervention.  On IV heparin drip.  Symptoms seem to have improved since admission.  [facility] cardiology consulted for preop clearance.  Type 2 diabetes mellitus  On sliding scale insulin in good control in hospital.  Ongoing tobacco abuse  Cessation counseled.  Patient declines nicotine patch.  Hypertension  Reasonable inpatient blood pressure control.  Stage III chronic kidney disease  Creatinine seems to be at baseline.  CAD, status post CABG  Asymptomatic.  Status post pacemaker    Code Status: Full Family Communication: Discussed with patient's daughter at bedside.  Disposition Plan: Home when medically stable.   Consultants:  Vascular surgery-Dr. Georgiann Hahn cardiology  Procedures:  None  Antibiotics:  None  HPI/Subjective: Left leg symptoms have improved. Bluish discoloration of left toes resolved. Denies pain in left lower extremity. No tingling or numbness. Thinks strength is  also improved.   Objective: Filed Vitals:   02/18/12 2354 02/19/12 0425 02/19/12 0954 02/19/12 1403  BP: 154/82 119/66 141/89 155/71  Pulse: 74 70 64   Temp: 98.3 F (36.8 C) 98.4 F (36.9 C)    TempSrc: Oral Oral    Resp: 16 16    Height: 5\' 6"  (1.676 m)     Weight: 75.9 kg (167 lb 5.3 oz)     SpO2: 99% 98%      Intake/Output Summary (Last 24 hours) at 02/19/12 1439 Last data filed at 02/19/12 0730  Gross per 24 hour  Intake      0 ml  Output      0 ml  Net      0 ml   Filed Weights   02/18/12 1413 02/18/12 2354  Weight: 80.74 kg (178 lb) 75.9 kg (167 lb 5.3 oz)    Exam:   General exam: Pleasant and comfortable.  Respiratory system: Clear to auscultation. No increased work of breathing.  Cardiovascular system: First and second heart sounds heard, regular rate and rhythm. No JVD, murmurs or pedal edema. Telemetry shows sinus rhythm/V. paced rhythm.  Gastrointestinal system: Abdomen is nondistended, soft and nontender. Normal bowel sounds heard.  Central nervous system: Alert and oriented. No focal neurological deficits.  Extremities: Symmetric 5 x 5 power. Absent left lower extremity dorsalis pedis and posterior tibial pulsations. Left leg and foot slightly cool to touch. Power seems normal. Minimal duskiness of left fourth and fifth toes.  Data Reviewed: Basic Metabolic Panel:  Lab 02/19/12 5621 02/18/12 1743  NA 141 137  K 4.3 3.6  CL 102 98  CO2 29 --  GLUCOSE 116* 182*  BUN 9 8  CREATININE 1.56* 1.30  CALCIUM 9.1 --  MG -- --  PHOS -- --   Liver Function Tests: No results found for this basename: AST:5,ALT:5,ALKPHOS:5,BILITOT:5,PROT:5,ALBUMIN:5 in the last 168 hours No results found for this basename: LIPASE:5,AMYLASE:5 in the last 168 hours No results found for this basename: AMMONIA:5 in the last 168 hours CBC:  Lab 02/19/12 0450 02/18/12 1743 02/18/12 1420  WBC 7.1 -- 8.8  NEUTROABS -- -- 6.7  HGB 14.3 15.3 15.1  HCT 42.0 45.0 43.6  MCV  87.0 -- 86.3  PLT 204 -- 247   Cardiac Enzymes: No results found for this basename: CKTOTAL:5,CKMB:5,CKMBINDEX:5,TROPONINI:5 in the last 168 hours BNP (last 3 results) No results found for this basename: PROBNP:3 in the last 8760 hours CBG:  Lab 02/19/12 1242 02/19/12 0951 02/19/12 0547  GLUCAP 120* 103* 110*    No results found for this or any previous visit (from the past 240 hour(s)).   Studies: No results found.  Scheduled Meds:    . aspirin  81 mg Oral Daily  . atorvastatin  80 mg Oral q1800  . baclofen  10 mg Oral Daily  . chlorproMAZINE  25 mg Oral Daily  . enoxaparin (LOVENOX) injection  40 mg Subcutaneous Q24H  . ezetimibe  10 mg Oral Daily  . famotidine  20 mg Oral Daily  . glimepiride  4 mg Oral QAC breakfast  . hydrochlorothiazide  12.5 mg Oral Daily  . insulin aspart  0-9 Units Subcutaneous TID WC  . lisinopril  20 mg Oral Daily  . metoprolol succinate  100 mg Oral Daily  . pantoprazole  40 mg Oral Daily  . sodium chloride  3 mL Intravenous Q12H   Continuous Infusions:    . sodium chloride 75 mL/hr at 02/19/12 0019  . sodium chloride      Principal Problem:  *Ischemic leg Active Problems:  DIABETES MELLITUS, TYPE II  CAD  Cardiac pacemaker in situ    Time spent: 30 minutes     [facility]  [facility] Pager [phone]. If 8PM-8AM, please contact night-coverage at [email or web address], password [facility] 02/19/2012, 2:39 PM  LOS: 1 day     Addendum:  Per nursing, returned from cath lab and then became confused- oriented only to self. No agitation.No focal deficits Apparently received cocktail- benadryl, solumedrol and pepcid in cath lab for itching. No documented Dementia or Alcohol use. ? Medication side effect, ? Sundowning. Recruitment consultant. Monitor. If gets worse  May need further evaluation.  HONGALGI,ANAND 6:38 PM

## 2012-02-19 NOTE — Op Note (Signed)
OPERATIVE NOTE   PROCEDURE: 1.  Right common femoral artery cannulation under ultrasound guidance 2.  Aortogram 3.  First order arterial selection 4.  Bilateral pelvic angiogram 5.  Left leg runoff  PRE-OPERATIVE DIAGNOSIS: Left foot rest pain  POST-OPERATIVE DIAGNOSIS: same as above   SURGEON: Leonides Sake, MD  ANESTHESIA: conscious sedation  ESTIMATED BLOOD LOSS: 50 cc  CONTRAST: 95 cc  FINDING(S):  Aorta: patent but extensive calcified atherosclerosis  Superior mesenteric artery: patent Celiac artery: not seen  Right Left  RA Patent Patent, co-dominant renal arteries with >75%  CIA Patent Patent with 50-75% stenoses  EIA Patent Occluded  IIA Patent Occluded  CFA Patent Underfilled, reconstitutes via pelvic collaterals  SFA  Underfilled, reconstitutes via pelvic collaterals  PFA  Underfilled, reconstitutes via pelvic collaterals  Pop  Underfilled, reconstitutes via pelvic collaterals, occludes at knee level  Trif  Occluded  AT  Occluded  Pero  Occluded  PT  Occluded proximally, Reconstitutes in mid-calf via geniculate collaterals   SPECIMEN(S):  none  INDICATIONS:   [name] is a 66 y.o. male who presents with left foot rest pain.  The patient presents for: aortogram, leg runoff, and possible intervention.  I discussed with the patient the nature of angiographic procedures, especially the limited patencies of any endovascular intervention.  The patient is aware of that the risks of an angiographic procedure include but are not limited to: bleeding, infection, access site complications, renal failure, embolization, rupture of vessel, dissection, possible need for emergent surgical intervention, possible need for surgical procedures to treat the patient's pathology, and stroke and death.  The patient is aware of the risks and agrees to proceed.  DESCRIPTION: After full informed consent was obtained from the patient, the patient was brought back to the angiography  suite.  The patient was placed supine upon the angiography table and connected to monitoring equipment.  The patient was then given conscious sedation, the amounts of which are documented in the patient's chart.  The patient was prepped and drape in the standard fashion for an angiographic procedure.  At this point, attention was turned to the right groin.  Under ultrasound guidance, the right common femoral artery was cannulated with a micropuncture needle.  The microwire was advanced into the iliac arterial system.  The needle was exchanged for a microsheath, which was loaded into the common femoral artery over the wire.  The microwire was exchanged for a [facility] wire which was advanced into the aorta.  The microsheath was then exchanged for a 5-Fr sheath which was loaded into the common femoral artery.  The Omniflush catheter was then loaded over the wire up to the level of L1.  The catheter was connected to the power injector circuit.  After de-airring and de-clotting the circuit, a power injector aortogram was completed.  The findings are listed above.  The catheter was pulled down to just proximal to the bifurcation and RAO and LAO pelvic injections were completed to fully image the iliac arterial system.  The [facility] wire was replaced in the catheter, and using the [place] and Omniflush catheter, the left common iliac artery was selected.  The catheter would not track into the short segment of left common iliac artery patent.  The catheter was exchanged for a SOS catheter which was used to select the left common iliac artery.  The catheter tip was lodged into the left common iliac artery.  An automated left leg runoff was completed.  Based on the images,  no purely endovascular intervention is likely to be successful.  The sheath was aspirated.  No clots were present and the sheath was reloaded with heparinized saline.     COMPLICATIONS: none  CONDITION: stable   Leonides Sake, MD [facility] [place] Office: [phone] Pager: [phone]  02/19/2012, 12:31 PM

## 2012-02-19 NOTE — Progress Notes (Signed)
02/19/2012 2015 Pt cooperative during physical assessment. Pt allowed me to doppler the pedal pulse in L foot, but did not allow me to assess the R foot.  Pt refuses to have IV restarted at this time. Pt alert to self, but disoriented to time and place. Pt also verbalized having visual hallucinations of "dogs in the corner".  Pt is easily reoriented and remains cooperative with simple instructions. Safety sitter in place. Will continue to monitor closely. Spenser Cong, Avie Echevaria , RN

## 2012-02-19 NOTE — Progress Notes (Signed)
Patient ID: [name], male   DOB: 1946-01-10, 66 y.o.   MRN: [id number]   CARDIOLOGY CONSULT NOTE  Patient ID: [name] MRN: [id number], DOB/AGE: 01-22-1946   Admit date: 02/18/2012 Date of Consult: 02/19/2012   Primary Physician: Oliver Barre, MD Primary Cardiologist: Dr Patty Sermons  Pt. Profile  66 year old gentleman admitted with left lower extremity rest pain. He has a history of coronary artery disease with bypass surgery. We are asked to see him for clearance for possible surgery.  Problem List  Past Medical History  Diagnosis Date  . Cerebellar stroke, acute   . IHD (ischemic heart disease)   . Hiccoughs   . Diabetes mellitus   . Hyperlipidemia   . Vitamin B12 deficiency   . DIABETES MELLITUS, TYPE II 09/20/2008    Qualifier: Diagnosis of  By: Flonnie Overman    . HYPERTENSION 09/20/2008    Qualifier: Diagnosis of  By: Flonnie Overman    . CAD 09/20/2008    Qualifier: Diagnosis of  By: Flonnie Overman    . BRADYCARDIA 09/20/2008    Qualifier: Diagnosis of  By: Flonnie Overman    . 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 TIA 09/20/2008    Qualifier: Diagnosis of  By: Flonnie Overman    . PSORIASIS 09/20/2008    Qualifier: Diagnosis of  By: Flonnie Overman    . Cardiac pacemaker in situ 10/08/2008    Qualifier: Diagnosis of  By: Ladona Ridgel, MD, Jerrell Mylar   . Benign hypertensive heart disease without heart failure 08/03/2010  . Hx of CABG 08/03/2010  . Intractable hiccoughs 08/03/2010  . Tobacco abuse 08/03/2010  . Vascular claudication 11/15/2010  . Dyslipidemia 09/05/2011  . GERD (gastroesophageal reflux disease) 11/06/2011  . Nephrolithiasis 11/06/2011  . Carotid stenosis 11/06/2011    Bilat 60-80% 2010    Past Surgical History  Procedure Date  . Coronary artery bypass graft   . Coronary angioplasty 09/18/2006    WELL PRESERVED LEFT VENTRICULAR SYSTOLIC FUNCTION. THERE APPEARS TBE SOME HYPOKINESIS OF THE  POSTERIOR Odas Ozer. EF 60%     Allergies  Allergies  Allergen Reactions  . Imdur (Isosorbide Mononitrate)     HEADACHES   . Metformin And Related     ABDOMINAL CRAMPS    HPI   [name] is a pleasant 66 year-old white male who has diffuse atherosclerosis. Specifically, he has a history of bilateral carotid artery disease, 60-80% study in 2010, history of vertebrobasilar syndrome with a TIA, history of coronary artery disease status post coronary bypass grafting, history of PCI July 2008, and now progressive claudication with rest pain in the left lower extremity over the last several days. Thrombus on top of plaque suspected. Remarkably, he is improved with IV heparin. Angiogram performed and results are pending.  He is a heavy smoker he continues to smoke a pack of cigarettes a day. He is also diabetic, has hypertension, and hyperlipidemia.  He denies any exertional chest pain or angina or other ischemic symptoms. He was walking normally several days ago. He denies any recent symptoms of TIAs or mini strokes. He denies orthopnea, PND or edema. Last echocardiogram showed well-preserved LV function with an ejection fraction of 55-60%. He did have aortic valve thickness but no stenosis. He denies any presyncope or syncope. He has chronic dyspnea on exertion that is stable.   Inpatient Medications     . aspirin  81 mg Oral Daily  .  atorvastatin  80 mg Oral q1800  . baclofen  10 mg Oral Daily  . chlorproMAZINE  25 mg Oral Daily  . ezetimibe  10 mg Oral Daily  . famotidine  20 mg Oral Daily  . glimepiride  4 mg Oral QAC breakfast  . hydrochlorothiazide  12.5 mg Oral Daily  . insulin aspart  0-9 Units Subcutaneous TID WC  . lisinopril  20 mg Oral Daily  . metoprolol succinate  100 mg Oral Daily  . pantoprazole  40 mg Oral Daily  . sodium chloride  3 mL Intravenous Q12H    Family History Family History  Problem Relation Age of Onset  . COPD Mother   . Coronary artery disease  Father      Social History History   Social History  . Marital Status: Single    Spouse Name: N/A    Number of Children: N/A  . Years of Education: N/A   Occupational History  . Not on file.   Social History Main Topics  . Smoking status: Current Every Day Smoker -- 1.0 packs/day    Types: Cigarettes  . Smokeless tobacco: Former Neurosurgeon  . Alcohol Use: No  . Drug Use: No  . Sexually Active: Not on file   Other Topics Concern  . Not on file   Social History Narrative  . No narrative on file     Review of Systems  General:  No chills, fever, night sweats or weight changes.  Cardiovascular:  No chest pain,  edema, orthopnea, palpitations, paroxysmal nocturnal dyspnea. Dermatological: No rash, lesions/masses Respiratory: Chronic cough and dyspnea on exertion  Urologic: No hematuria, dysuria Abdominal:   No nausea, vomiting, diarrhea, bright red blood per rectum, melena, or hematemesis Neurologic:  No visual changes, wkns, changes in mental status. All other systems reviewed and are otherwise negative except as noted above.  Physical Exam  Blood pressure 141/89, pulse 64, temperature 98.4 F (36.9 C), temperature source Oral, resp. rate 16, height 5\' 6"  (1.676 m), weight 167 lb 5.3 oz (75.9 kg), SpO2 98.00%.  General: Pleasant, NAD, Looks older than stated age  Psych: Normal affect. Neuro: Alert and oriented X 3. Moves all extremities spontaneously. HEENT: Normal, dentition is poor  Neck: Supple, bilateral carotid bruits left greater than right, no JVD  Lungs:  Resp regular and unlabored,diffusely decreased breath sounds throughout  Heart: RRR no s3, s4, soft systolic murmur, S2 splits with inspiration  Abdomen: Soft, non-tender, non-distended, BS + x 4.  Extremities: No clubbing, cyanosis or edema. Pulses not appreciated in the left lower extremity.  Labs  No results found for this basename: CKTOTAL:4,CKMB:4,TROPONINI:4 in the last 72 hours Lab Results  Component  Value Date   WBC 7.1 02/19/2012   HGB 14.3 02/19/2012   HCT 42.0 02/19/2012   MCV 87.0 02/19/2012   PLT 204 02/19/2012    Lab 02/19/12 0450  NA 141  K 4.3  CL 102  CO2 29  BUN 9  CREATININE 1.56*  CALCIUM 9.1  PROT --  BILITOT --  ALKPHOS --  ALT --  AST --  GLUCOSE 116*   Lab Results  Component Value Date   CHOL 235* 02/07/2012   HDL 33.60* 02/07/2012   LDLCALC  Value: UNABLE TO CALCULATE IF TRIGLYCERIDE OVER 400 mg/dL        Total Cholesterol/HDL:CHD Risk Coronary Heart Disease Risk Table                     Men  Women  1/2 Average Risk   3.4   3.3  Average Risk       5.0   4.4  2 X Average Risk   9.6   7.1  3 X Average Risk  23.4   11.0        Use the calculated Patient Ratio above and the CHD Risk Table to determine the patient's CHD Risk.        ATP III CLASSIFICATION (LDL):  <100     mg/dL   Optimal  696-295  mg/dL   Near or Above                    Optimal  130-159  mg/dL   Borderline  284-132  mg/dL   High  >440     mg/dL   Very High 03/07/7251   TRIG 239.0* 02/07/2012   No results found for this basename: DDIMER    Radiology/Studies  No results found.  ECG Normal sinus rhythm, pronounced ST segment T-wave changes with inversion in the anterolateral leads, somewhat more pronounced than previous EKGs.  ASSESSMENT AND PLAN  Mr. Mabile is a 66 year old white male with a compromise left lower extremity. He presents with rest pain. Angiogram is pending. He has diffuse atherosclerosis and previous history of not only cerebrovascular disease but TIA. Has a history of bypass surgery with well preserved systolic left ventricular function. He has multiple risk factors including heavy smoking. He also has COPD.  I have cleared him for surgery if necessary for left lower leg  ischemia. I've discussed with him as well as his family, including her daughter, that he is at increased risk of having a cerebrovascular accident, myocardial infarction, or loss of part of his lower  extremity. I strongly advised him to quit smoking and will order a tobacco cessation consult. He will need help with this. I've also advised him about being compliant with his medications and his medical followup.   Signed, Valera Castle, MD 02/19/2012, 10:48 AM

## 2012-02-19 NOTE — Progress Notes (Signed)
Pt trying to get out of bed and removed equipment (bp cuff, tele-box, etc).  Re-oriented pt to situation. Appears to be confused where he is located.  Ambulated to bathroom and assisted back to bed.  No dizziness, sob, pain, or distress.  Checked right groin and placed band aid (level 0).  Spoke with pt's daughter Florentina Addison and let her know he was having some confusion.  Daughter says pt has not slept in over 24 hours.  Pt had an episode in the cath lab of itching after contrast given and was given a "cocktail" during the procedure.  Unsure if medication and lack of sleep is contributing to confusion.  Will continue to monitor and re-orient as needed.  Pt is resting in bed with call bell within reach.  Will contact MD to update. Thomas Hoff

## 2012-02-19 NOTE — H&P (Signed)
  [facility]  History and Physical Update  The patient was interviewed and re-examined.  The patient's previous History and Physical has been reviewed and is unchanged from my consult on: 02/18/12.  There is no change in the plan of care: aortogram, bilateral leg runoff, and possible intervention.  Leonides Sake, MD [facility] [place] Office: [phone] Pager: [phone]  02/19/2012, 11:05 AM

## 2012-02-20 ENCOUNTER — Encounter (HOSPITAL_COMMUNITY): Payer: Self-pay | Admitting: Anesthesiology

## 2012-02-20 ENCOUNTER — Inpatient Hospital Stay (HOSPITAL_COMMUNITY): Payer: Medicare Other

## 2012-02-20 ENCOUNTER — Other Ambulatory Visit: Payer: Self-pay | Admitting: *Deleted

## 2012-02-20 DIAGNOSIS — R410 Disorientation, unspecified: Secondary | ICD-10-CM

## 2012-02-20 DIAGNOSIS — R4182 Altered mental status, unspecified: Secondary | ICD-10-CM

## 2012-02-20 DIAGNOSIS — R443 Hallucinations, unspecified: Secondary | ICD-10-CM

## 2012-02-20 DIAGNOSIS — R404 Transient alteration of awareness: Secondary | ICD-10-CM

## 2012-02-20 DIAGNOSIS — R0989 Other specified symptoms and signs involving the circulatory and respiratory systems: Secondary | ICD-10-CM

## 2012-02-20 LAB — BASIC METABOLIC PANEL
BUN: 16 mg/dL (ref 6–23)
Chloride: 97 mEq/L (ref 96–112)
GFR calc non Af Amer: 44 mL/min — ABNORMAL LOW (ref >90)
Glucose, Bld: 232 mg/dL — ABNORMAL HIGH (ref 70–99)
Potassium: 4.4 mEq/L (ref 3.5–5.1)

## 2012-02-20 LAB — HEPATIC FUNCTION PANEL
ALT: 7 U/L (ref 0–53)
AST: 13 U/L (ref 0–37)
Albumin: 3.3 g/dL — ABNORMAL LOW (ref 3.5–5.2)
Alkaline Phosphatase: 94 U/L (ref 39–117)
Indirect Bilirubin: 0.3 mg/dL (ref 0.3–0.9)
Total Protein: 7.2 g/dL (ref 6.0–8.3)

## 2012-02-20 LAB — CBC
HCT: 41.9 % (ref 39.0–52.0)
Hemoglobin: 14.5 g/dL (ref 13.0–17.0)
MCHC: 34.6 g/dL (ref 30.0–36.0)
MCV: 84.8 fL (ref 78.0–100.0)

## 2012-02-20 LAB — GLUCOSE, CAPILLARY
Glucose-Capillary: 218 mg/dL — ABNORMAL HIGH (ref 70–99)
Glucose-Capillary: 193 mg/dL — ABNORMAL HIGH (ref 70–99)

## 2012-02-20 MED ORDER — HYDRALAZINE HCL 20 MG/ML IJ SOLN
10.0000 mg | INTRAMUSCULAR | Status: DC | PRN
  Filled 2012-02-20: qty 0.5

## 2012-02-20 MED ORDER — OXYCODONE-ACETAMINOPHEN 5-325 MG PO TABS: 1.0000 | ORAL_TABLET | ORAL | Status: DC | PRN

## 2012-02-20 MED ORDER — HALOPERIDOL LACTATE 5 MG/ML IJ SOLN
1.0000 mg | Freq: Four times a day (QID) | INTRAMUSCULAR | Status: DC | PRN
  Filled 2012-02-20: qty 0.2

## 2012-02-20 MED ORDER — AMLODIPINE BESYLATE 5 MG PO TABS
5.0000 mg | ORAL_TABLET | Freq: Every day | ORAL | Status: DC
  Administered 2012-02-20: 5 mg via ORAL
  Filled 2012-02-20: qty 1

## 2012-02-20 MED ORDER — AMLODIPINE BESYLATE 5 MG PO TABS: 5.0000 mg | ORAL_TABLET | Freq: Every day | ORAL | Status: DC

## 2012-02-20 MED ORDER — HALOPERIDOL 0.5 MG PO TABS
0.5000 mg | ORAL_TABLET | Freq: Four times a day (QID) | ORAL | Status: DC | PRN
  Filled 2012-02-20: qty 2

## 2012-02-20 NOTE — Progress Notes (Signed)
Pt anxiously awaiting d/c home; MD paged at this time to make aware; will await callback.

## 2012-02-20 NOTE — Progress Notes (Signed)
02/20/2012 0430  Pt became agitated after speaking with family member on the phone.  Overheard pt telling family member, "I'm not staying here. I'm leaving today."  Pt agitated and pushed by safety sitter and began walking down the hall. RN x 2 followed.  Asked pt where he was going and he said he was, "leaving out of here."  Attempted to get pt to come back to room but he refused. Pt then exited through the fire escape stairwell and walked down the stairs and outside, RNs following.  Charge nurse called [facility] and security to help escort pt back to room.  We were able to get pt to call his daughter on his cell phone and she convinced him to let us escort him back to his room. RNs, AC, Rapid Response RN, and security escorted pt back to his room. PA on call made aware.  PA to come to floor to assess pt. Will continue to closely monitor. Trena Dunavan, Avie Echevaria , RN

## 2012-02-20 NOTE — Progress Notes (Signed)
[facility] PROGRESS NOTE  [name] AVW:[id number] DOB: 11-06-1945 DOA: 02/18/2012 PCP: Oliver Barre, MD Primary cardiologist: Dr. [name]  HPI: 66 year-old male history of CAD status post CABG, hypertension, diabetes mellitus2, ongoing tobacco abuse, pacemaker, TIA/CVA, carotid disease, hyperlipidemia started noticing discoloration of his left little toe 2 days ago. Slowly started worsening pain and other toes also got discolored. At this point patient presented the ER today and had Dopplers done which showed poor blood flow and vascular surgeon on call Dr. Johny Drilling was consulted and patient has been admitted for further management. Patient also has been started IV heparin infusion. Otherwise patient denies any chest pain shortness of breath fever chills or any trauma.   Assessment/Plan:  Hallucinations and altered mental status:  Improving.  Patient is A&Ox4.  Patient received benadryl, solumedrol during procedure yesterday -  Spoke with Dr. Ferol Luz who states that most likely his hallucinations are from underlying metabolic process.  Will defer consult at this time as symptoms are improving slightly and work up for metabolic causes is pending -  Spoke with anesthesia regarding sedation used for conscious sedation yesterday and whether that may be contributing to his symptoms -  TSH, ammonia level -  Add on LFTs -  CT head demonstrates premature atrophy and chronic microvascular disease (may have some underlying dementia) -  Haldol prn -  Continue sitter -  Patient is involuntarily committed currently -  Monitor for signs of alcohol withdrawal   Left lower extremity ischemia, secondary to peripheral artery disease.  POD #1 s/p Ao, LRo; L EIA and BK pop occlusion, LLE CLI.    Appreciate vascular surgery   R to L fem-fem BPG, L fem-PT bypass, with right GSV harvest on Monday 23 DEC 13  Type 2 diabetes mellitus, fingersticks trending down slightly   On sliding scale  insulin in good control in hospital.  Ongoing tobacco abuse  Cessation counseled.  Patient declines nicotine patch.  Hypertension, blood pressures mildly elevated since overnight and may be related to process causing confusion and hallucinations.    Monitor  Hydralazine prn SBP>180 or DBP > 110.  Stage III chronic kidney disease  Creatinine seems to be at baseline.  CAD, status post CABG  Asymptomatic.  Status post pacemaker  DIET:  Diabetic ACCESS:  PIV IVF:  35ml/h PROPH:  lovenox  Code Status: Full Family Communication: Discussed with patient's daughter at bedside.  Disposition Plan: Home when mental status improved   Consultants:  Vascular surgery-Dr. Georgiann Hahn cardiology  Procedures:  None  Antibiotics:  None  HPI/Subjective: Patient denies chest pain, shortness of breath, nausea, vomiting, diarrhea, constipation, difficulty urinating.  He states that his foot feels better today and that he is scheduled for surgery soon.  He has been having some visual hallucinations of seeing black holes in the wall.  He states he understands that there is something unusual going on.    Objective: Filed Vitals:   02/19/12 1956 02/20/12 0340 02/20/12 1105 02/20/12 1329  BP: 160/73 161/69 166/63 164/67  Pulse: 66 66 61 60  Temp: 98.6 F (37 C) 98 F (36.7 C)  97.4 F (36.3 C)  TempSrc: Oral Oral  Oral  Resp: 18 18  18   Height:      Weight:      SpO2: 100% 99%  100%    Intake/Output Summary (Last 24 hours) at 02/20/12 1348 Last data filed at 02/20/12 1108  Gross per 24 hour  Intake    480  ml  Output      0 ml  Net    480 ml   Filed Weights   02/18/12 1413 02/18/12 2354  Weight: 80.74 kg (178 lb) 75.9 kg (167 lb 5.3 oz)    Exam: Patient refuses exam this morning   General exam:  Caucasian male, no acute distress  HEENT:  MMM  Psych:  A&Ox4.    Neuro:  No tongue fasciculations or hand tremors  Data Reviewed: Basic Metabolic Panel:  Lab  02/20/12 0515 02/19/12 1516 02/19/12 0450 02/18/12 1743  NA 135 -- 141 137  K 4.4 -- 4.3 3.6  CL 97 -- 102 98  CO2 23 -- 29 --  GLUCOSE 232* -- 116* 182*  BUN 16 -- 9 8  CREATININE 1.61* 1.34 1.56* 1.30  CALCIUM 9.7 -- 9.1 --  MG -- -- -- --  PHOS -- -- -- --   Liver Function Tests: No results found for this basename: AST:5,ALT:5,ALKPHOS:5,BILITOT:5,PROT:5,ALBUMIN:5 in the last 168 hours No results found for this basename: LIPASE:5,AMYLASE:5 in the last 168 hours No results found for this basename: AMMONIA:5 in the last 168 hours CBC:  Lab 02/20/12 0515 02/19/12 1516 02/19/12 0450 02/18/12 1743 02/18/12 1420  WBC 8.0 7.7 7.1 -- 8.8  NEUTROABS -- -- -- -- 6.7  HGB 14.5 14.1 14.3 15.3 15.1  HCT 41.9 41.1 42.0 45.0 43.6  MCV 84.8 85.6 87.0 -- 86.3  PLT 232 202 204 -- 247   Cardiac Enzymes: No results found for this basename: CKTOTAL:5,CKMB:5,CKMBINDEX:5,TROPONINI:5 in the last 168 hours BNP (last 3 results) No results found for this basename: PROBNP:3 in the last 8760 hours CBG:  Lab 02/20/12 1110 02/20/12 0554 02/19/12 2159 02/19/12 1852 02/19/12 1642  GLUCAP 193* 231* 218* 338* 270*    No results found for this or any previous visit (from the past 240 hour(s)).   Studies: Dg Chest 2 View  02/20/2012  *RADIOLOGY REPORT*  Clinical Data: Preoperative respiratory evaluation prior to femoral bypass graft.  Prior history of stroke, CABG, and current history of diabetes, hypertension.  CHEST - 2 VIEW  Comparison: Two-view chest x-ray 06/25/2008, 05/28/2008.  Findings: Prior sternotomy for CABG.  Cardiac silhouette mildly enlarged but stable.  Thoracic aorta tortuous and atherosclerotic, unchanged.  Hilar and mediastinal contours otherwise unremarkable. Lungs clear.  Bronchovascular markings normal.  Pulmonary vascularity normal.  No pneumothorax.  No pleural effusions.  Mild degenerative changes involving the mid thoracic spine.  IMPRESSION: No acute cardiopulmonary disease.  Stable  mild cardiomegaly.   Original Report Authenticated By: Hulan Saas, M.D.    Ct Head Wo Contrast  02/20/2012  *RADIOLOGY REPORT*  Clinical Data: Confusion with hallucinations.  CT HEAD WITHOUT CONTRAST  Technique:  Contiguous axial images were obtained from the base of the skull through the vertex without contrast.  Comparison: CTA head neck 08/18/2009.  Findings: There is no evidence for acute infarction, intracranial hemorrhage, mass lesion, hydrocephalus, or extra-axial fluid.  Mild premature atrophy.  Remote right thalamic lacune.  Mild chronic microvascular ischemic change particularly in the bifrontal regions.  Calvarium intact.  Moderate vascular calcification. Chronic sinus disease.  No mastoid fluid.  IMPRESSION: Mild premature atrophy and chronic microvascular ischemic change similar to priors.  No acute intracranial findings.   Original Report Authenticated By: Davonna Belling, M.D.     Scheduled Meds:    . amLODipine  5 mg Oral Daily  . aspirin  81 mg Oral Daily  . atorvastatin  80 mg Oral q1800  . baclofen  10 mg Oral Daily  . chlorproMAZINE  25 mg Oral Daily  . enoxaparin (LOVENOX) injection  40 mg Subcutaneous Q24H  . ezetimibe  10 mg Oral Daily  . famotidine  20 mg Oral Daily  . hydrochlorothiazide  12.5 mg Oral Daily  . insulin aspart  0-15 Units Subcutaneous TID WC  . insulin aspart  0-5 Units Subcutaneous QHS  . lisinopril  20 mg Oral Daily  . metoprolol succinate  100 mg Oral Daily  . pantoprazole  40 mg Oral Daily  . sodium chloride  3 mL Intravenous Q12H   Continuous Infusions:    . sodium chloride      Principal Problem:  *Ischemic leg Active Problems:  DIABETES MELLITUS, TYPE II  HYPERTENSION  CAD  Cardiac pacemaker in situ  Tobacco abuse  Chronic kidney disease, stage 3    Time spent: 30 minutes     Jocelyn Lowery, [facility]  [facility] Pager [phone]. If 7PM-7AM, please contact night-coverage at [email or web address], password [facility] 02/20/2012, 1:48  PM  LOS: 2 days    Flynn Lininger 1:48 PM

## 2012-02-20 NOTE — Progress Notes (Signed)
   Subjective:  Left foot feels better. No chest pain.  Objective:  Vital Signs in the last 24 hours: Temp:  [98 F (36.7 C)-98.6 F (37 C)] 98 F (36.7 C) (12/18 0340) Pulse Rate:  [64-66] 66  (12/18 0340) Resp:  [18] 18  (12/18 0340) BP: (132-162)/(68-89) 161/69 mmHg (12/18 0340) SpO2:  [99 %-100 %] 99 % (12/18 0340)  Intake/Output from previous day: 12/17 0701 - 12/18 0700 In: 240 [P.O.:240] Out: -  Intake/Output from this shift:       . aspirin  81 mg Oral Daily  . atorvastatin  80 mg Oral q1800  . baclofen  10 mg Oral Daily  . chlorproMAZINE  25 mg Oral Daily  . enoxaparin (LOVENOX) injection  40 mg Subcutaneous Q24H  . ezetimibe  10 mg Oral Daily  . famotidine  20 mg Oral Daily  . hydrochlorothiazide  12.5 mg Oral Daily  . insulin aspart  0-15 Units Subcutaneous TID WC  . insulin aspart  0-5 Units Subcutaneous QHS  . lisinopril  20 mg Oral Daily  . metoprolol succinate  100 mg Oral Daily  . pantoprazole  40 mg Oral Daily  . sodium chloride  3 mL Intravenous Q12H      . sodium chloride      Physical Exam: The patient appears to be in no distress.  General: Pleasant, NAD, Looks older than stated age. Smiling, does not acknowledge elopement last evening.  Psych: Normal affect.  Neuro: Alert and oriented X 3. Moves all extremities spontaneously.  HEENT: Normal, dentition is poor  Neck: Supple, bilateral carotid bruits left greater than right, no JVD  Lungs: Resp regular and unlabored,diffusely decreased breath sounds throughout  Heart: RRR no s3, s4, soft systolic murmur, S2 splits with inspiration  Abdomen: Soft, non-tender, non-distended, BS + x 4.  Extremities: No clubbing, cyanosis or edema. Pulses not appreciated in the left lower extremity.   Lab Results:  Basename 02/20/12 0515 02/19/12 1516  WBC 8.0 7.7  HGB 14.5 14.1  PLT 232 202    Basename 02/20/12 0515 02/19/12 1516 02/19/12 0450  NA 135 -- 141  K 4.4 -- 4.3  CL 97 -- 102  CO2 23 --  29  GLUCOSE 232* -- 116*  BUN 16 -- 9  CREATININE 1.61* 1.34 --   No results found for this basename: TROPONINI:2,CK,MB:2 in the last 72 hours Hepatic Function Panel No results found for this basename: PROT,ALBUMIN,AST,ALT,ALKPHOS,BILITOT,BILIDIR,IBILI in the last 72 hours No results found for this basename: CHOL in the last 72 hours No results found for this basename: PROTIME in the last 72 hours  Imaging: Imaging results have been reviewed  Cardiac Studies:  Assessment/Plan:  Patient Active Hospital Problem List: Ischemic leg (02/18/2012)   Assessment: Less pain in foot this am   Plan: Per [facility]  HYPERTENSION (09/20/2008)   Assessment: Improving but still high   Plan: Add amlodipine 5 mg daily. CAD (09/20/2008)   Assessment: No chest pain.   Plan: Continue BB statin Cardiac pacemaker in situ (10/08/2008)   Assessment: Normal function.   Plan: Observe on telemetry Tobacco abuse (08/03/2010)   Assessment: Cessation mandatory   Plan: Counseled Chronic kidney disease, stage 3 (02/19/2012)   Assessment: creatinine up slightly post angiogram   Plan: Follow.   LOS: 2 days    [name] 02/20/2012, 7:49 AM

## 2012-02-20 NOTE — Care Management Note (Unsigned)
    Page 1 of 1   02/20/2012     4:32:58 PM   CARE MANAGEMENT NOTE 02/20/2012  Patient:  [name], [name]   Account Number:  [id number]  Date Initiated:  02/20/2012  Documentation initiated by:  Jakira Mcfadden  Subjective/Objective Assessment:   PT ADM WITH LT FOOT ISCHEMIA.  PT WITH CONFUSION LAST PM AFTER AORTAGRAM.  PTA, PT INDEPENDENT OF ADLS.     Action/Plan:   WILL FOLLOW FOR HOME NEEDS AS PT PROGRESSES.   Anticipated DC Date:  02/21/2012   Anticipated DC Plan:  HOME/SELF CARE      DC Planning Services  CM consult      Choice offered to / List presented to:             Status of service:  In process, will continue to follow Medicare Important Message given?   (If response is "NO", the following Medicare IM given date fields will be blank) Date Medicare IM given:   Date Additional Medicare IM given:    Discharge Disposition:    Per UR Regulation:  Reviewed for med. necessity/level of care/duration of stay  If discussed at Long Length of Stay Meetings, dates discussed:    Comments:

## 2012-02-20 NOTE — Progress Notes (Addendum)
[facility] [place]  Daily Progress Note  Assessment/Planning: POD #1 s/p Ao, LRo; L EIA and BK pop occlusion, LLE CLI   From vascular viewpoint, ok to d/c now.  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  Will plan on R to L fem-fem BPG, L fem-PT bypass, with right GSV harvest on Monday 23 DEC 13.  Patient needs to continue oral rehydration to help with CIN.  I discussed in depth the issues with the patient  1.  L EIA occlusion: Aobifem BPG vs fem-fem BPG discussed.  In medicare series, Aobifem BPG carries 5-10% mortality rate.  In this pt w/ multicomorbidities and atherosclerosis in multiple beds, I would expect the risk to be in the higher end.  2.  L BK pop occlusion:  L fem to PT with GSV would be the best option.  With no ABI in L ankle, I have doubts that the fem-fem BPG will be enough to revascularize the ischemic left foot.  The R GSV measurement demonstrate a possibly compromised conduit.  A composite graft-vein conduit may be necessary if the distal GSV is inadequate.  The L GSV has already been harvested for CABG. The risk, benefits, and alternative for bypass operations were discussed with the patient.  The patient is aware the risks include but are not limited to: bleeding, infection, myocardial infarction, stroke, limb loss, nerve damage, need for additional procedures in the future, wound complications, and inability to complete the bypass.   The patient is aware of these risks and agreed to proceed.  Subjective  - 1 Day Post-Op  No pain.  Events overnight noted.  Objective Filed Vitals:   02/19/12 1500 02/19/12 1515 02/19/12 1956 02/20/12 0340  BP: 148/82 146/83 160/73 161/69  Pulse:   66 66  Temp:   98.6 F (37 C) 98 F (36.7 C)  TempSrc:   Oral Oral  Resp:   18 18  Height:      Weight:      SpO2:  99% 100% 99%    Intake/Output Summary (Last 24 hours) at 02/20/12  1034 Last data filed at 02/19/12 1700  Gross per 24 hour  Intake    240 ml  Output      0 ml  Net    240 ml    PULM  CTAB CV  RRR GI  soft, NTND VASC  R groin: no hematoma, no echymosis  Laboratory CBC    Component Value Date/Time   WBC 8.0 02/20/2012 0515   HGB 14.5 02/20/2012 0515   HCT 41.9 02/20/2012 0515   PLT 232 02/20/2012 0515    BMET    Component Value Date/Time   NA 135 02/20/2012 0515   K 4.4 02/20/2012 0515   CL 97 02/20/2012 0515   CO2 23 02/20/2012 0515   GLUCOSE 232* 02/20/2012 0515   BUN 16 02/20/2012 0515   CREATININE 1.61* 02/20/2012 0515   CALCIUM 9.7 02/20/2012 0515   GFRNONAA 44* 02/20/2012 0515   GFRAA 51* 02/20/2012 0515    Leonides Sake, MD [facility] [place] Office: [phone] Pager: [phone]  02/20/2012, 10:34 AM

## 2012-02-20 NOTE — Progress Notes (Signed)
*  PRELIMINARY RESULTS* Vascular Ultrasound ABI has been completed.   Right ABI is in the range of mild disease.  Could not obtain Left ABI due to inaudible arterial flow. Left DP, PT, and Pero arteries were not audible with ABI machine. Using ultrasound machine, severely dampened monophasic flow is seen in these arteries.    RIGHT    LEFT    PRESSURE WAVEFORM  PRESSURE WAVEFORM  BRACHIAL   BRACHIAL    DP 132 mono DP    AT   AT  Severe damp mono  PT 130 bi PT  Severe damp mono  PER   PER    GREAT TOE  NA GREAT TOE  NA    RIGHT LEFT  ABI 0.76     Farrel Demark, RDMS, RVT  02/20/2012, 9:24 AM

## 2012-02-20 NOTE — Progress Notes (Signed)
RN informed pt he will not be d/c home at this time per MD; pt upset; pt walked down hallway and down stairs; sitter walking behind pt talked him into coming back to his room; pt walked back upstairs and escorted to room; pt now sitting in chair calm; will cont. To monitor.

## 2012-02-20 NOTE — Progress Notes (Addendum)
Shift event: This NP called by Sharma Covert, because patient had eloped hospital and was out on the grounds near street in front of hospital. Security there with Sharma Covert and RN from unit. Staff was able to talk pt into returning into the hospital to his room. Apparently, the pt took off his monitor, pulled IV, and changed gown into his own clothes before eloping from nsg unit. There was a Recruitment consultant in place, but the patient pushed passed her and left. He was agitated, confused and belligerent, actually attempting to hit staff. NP to unit. IVC papers obtained from ED and completed as this NP feels pt's present mental status is such that he is a danger to himself and others, demonstrated by his violence towards staff tonight and his elopement from hospital where he put himself in harm's way. Presently, he is confused and hallucinating on and off and does not have the capacity to make good judgments regarding his medical treatment which could be detrimental to him as well. He lacks mental capacity to care for himself and has no self control at present.  IVC papers completed. Daughter was called earlier, but at this time, has not arrived to the hospital.   S: pt says he isn't staying in the damn hospital. O: Belligerent 66 yo WM sitting in chair in room. Angry at present but cooperative. He is oriented at this time.  A/P: 1. Altered mental status ? etiology--infection, dementia, ? ETOH hx--unknown at present. But what is known at this time is his lack of capacity to make prudent decisions about his care, to care for himself in his present state, and the risk of harm to himself and others. IVC papers completed, notarized and RN to copy and transmit papers where they need to go.  Will talk with daughter if and when she arrives. Pt needs a CT head to r/o pathological cause of his mental incapacity, but at present, would not be cooperative for exam.   Addendum: Daughter, Florentina Addison, arrived on unit. I spoke to  her on phone about tonight's events and why IVC papers were filed. She was very understanding and appreciative for the care he is receiving. She wants him to stay until he is well. She denies any hx of dementia, although she says pt had a "test on his brain that showed blockages" a few years ago. Pt did drink ETOH, but quit over 30 yrs ago. Daughter denies any hx of illicit drug use. Daughter says she "has never seen him this way". Daughter says pt was confused upon her arrival tonight.  Maren Reamer, NP [facility]

## 2012-02-20 NOTE — Progress Notes (Signed)
Pt given d/c instructions at this time; pt verbalized understanding; pt to d/c home with significant other; pt awaiting ride home; will cont. To monitor.

## 2012-02-20 NOTE — Clinical Documentation Improvement (Signed)
CHANGE MENTAL STATUS DOCUMENTATION CLARIFICATION   THIS DOCUMENT IS NOT A PERMANENT PART OF THE MEDICAL RECORD  TO RESPOND TO THE THIS QUERY, FOLLOW THE INSTRUCTIONS BELOW:  1. If needed, update documentation for the patient's encounter via the notes activity.  2. Access this query again and click edit on the In [employer].  3. After updating, or not, click F2 to complete all highlighted (required) fields concerning your review. Select "additional documentation in the medical record" OR "no additional documentation provided".  4. Click Sign note button.  5. The deficiency will fall out of your In Basket *Please let us know if you are not able to complete this workflow by phone or e-mail (listed below).         02/20/12  Dear Dr. Rickey Barbara  In an effort to better capture your patient's severity of illness, reflect appropriate length of stay and utilization of resources, a review of the patient medical record has revealed the following indicators.    Based on your clinical judgment, please clarify and document in a progress note and/or discharge summary the clinical condition associated with the following supporting information:  In responding to this query please exercise your independent judgment.  The fact that a query is asked, does not imply that any particular answer is desired or expected.   Possible Clinical Conditions?  _______Encephalopathy (describe type if known)                       Anoxic                       Septic                       Alcoholic                        Hepatic                       Hypertensive                       Metabolic                       Toxic  _______Drug induced confusion/delirium _______Acute confusion _______Acute delirium _______Acute exacerbation of known dementia (indicate type) _______New diagnosis of Dementia, Alzheimer's, cerebral atherosclerosis _______Hypoxemia / Hypoxia _______Other Condition _______Cannot  Clinically Determine     Signs & Symptoms: Patient with AMS, visual hallucinations, agitation, confusion per 12/18 progress notes. Became belligerent, pushed past sitter, attempted to hit staff, and eloped per 12/18 progress notes. May have underlying dementia per 12/18 progress notes.   Treatment: 02/19/12:  Sitter at bedside for patient safety. 02/20/12:  Haldol q6hr prn.   Reviewed: Delirium documented per 02/20/12 d/c summary.                           Thank You,  Marciano Sequin,  Clinical Documentation Specialist:  Pager:[phone]  Phone: [phone]  Health Information Management [place]

## 2012-02-20 NOTE — Progress Notes (Signed)
Vascular Surgery  Awaiting ABI's and patient may be discharged from a vascular stand point.  We will have the office set up surgery for next week and they will call the patient.  Krishang Reading MAUREEN PA-C

## 2012-02-20 NOTE — Discharge Summary (Signed)
Physician Discharge Summary  [name] ZOX:[id number] DOB: 06-Oct-1945 DOA: 02/18/2012  PCP: Jacob Barre, MD  Admit date: 02/18/2012 Discharge date: 02/20/2012  Recommendations for Outpatient Follow-up:  R to L fem-fem BPG, L fem-PT bypass, with right GSV harvest on Monday 23 DEC 13 by Vascular Surgery Follow up BMP within 1-2 weeks to assess kidney function and check blood pressure.  At risk for contrast induced nephropathy   Discharge Diagnoses:  Principal Problem:  *Hallucinations Active Problems:  DIABETES MELLITUS, TYPE II  HYPERTENSION  CAD  Cardiac pacemaker in situ  Tobacco abuse  Ischemic leg  Chronic kidney disease, stage 3  Altered mental status  Delirium   Discharge Condition:  Stable, improved  Diet recommendation:  diabetic  Wt Readings from Last 3 Encounters:  02/18/12 75.9 kg (167 lb 5.3 oz)  02/18/12 75.9 kg (167 lb 5.3 oz)  02/07/12 77.678 kg (171 lb 4 oz)    History of present illness:   66 year-old male history of CAD status post CABG, hypertension, diabetes mellitus2, ongoing tobacco abuse started noticing discoloration of his left little toe 2 days ago. Slowly started worsening pain and other toes also got discolored. At this point patient presented the ER today and had Dopplers done which showed poor blood flow and vascular surgeon on call Dr. Johny Drilling was consulted and patient has been admitted for further management. Patient also has been started IV heparin infusion. Otherwise patient denies any chest pain shortness of breath fever chills or any trauma.   Hospital Course:   [name] was hospitalized with left foot pain concerning for ischemia.  Vascular surgery was consulted and her underwent right common femoral artery cannulation with aortogram, bilateral pelvic angiogram with leg runoff.  No clots were seen.  His heparin gtt was discontinued.  Results are as follows per op report:     Aorta: patent but extensive calcified atherosclerosis   Superior mesenteric artery: patent Celiac artery: not seen  Right  Left   RA  Patent  Patent, co-dominant renal arteries with >75%   CIA  Patent  Patent with 50-75% stenoses   EIA  Patent  Occluded   IIA  Patent  Occluded   CFA  Patent  Underfilled, reconstitutes via pelvic collaterals   SFA   Underfilled, reconstitutes via pelvic collaterals   PFA   Underfilled, reconstitutes via pelvic collaterals   Pop   Underfilled, reconstitutes via pelvic collaterals, occludes at knee level   Trif   Occluded   AT   Occluded   Pero   Occluded   PT   Occluded proximally, Reconstitutes in mid-calf via geniculate collaterals    He will be scheduled for right to left fem-fem bypass graft, left fem-pop bypass, and right GSV harvest on Monday December 23rd by vascular surgery.    Hallucinations and altered mental status, most likely acute delirium secondary to acute illness, sleep deprivation, and IV steroid.  The patient had an episode of confusion, disorientation and hallucination overnight.  He pulled out his IV and eloped, but was brought back to the hospital and placed under involuntary commitment.  His mental status and orientation improved with time.  His liver function, TSH, and ammonia levels were normal.  His head CT demonstrated premature atrophy and chronic microvascular disease.  At the time of discharge, the patient was A&Ox4 and his hallucinations are almost completely resolved.  He will likely improve best being in his home environment and with adequate sleep.    Type 2 diabetes  mellitus, his fingersticks were controlled with SSI.   Ongoing tobacco abuse, counseled cessation.   Hypertension, blood pressures mildly elevated since overnight and may be related to process causing confusion and hallucinations.  Cardiology started norvasc 5mg  daily and he should follow up with his primary care doctor within 1-2 weeks for recheck.   Stage III chronic kidney disease, creatinine remained at baseline  (slightly increase from 12/17 to 12/18, but still at his baseline).  He is at risk for contrast induced nephropathy and should stay well hydrated.  His primary care doctor should repeat BMP at follow up.  CAD, status post CABG.  Asymptomatic.  Cardiology was consulted for preoperative assessment prior to angiogram.   Consultants:  Vascular surgery-Dr. Georgiann Hahn cardiology Procedures:  None Antibiotics:  None  Discharge Exam: Filed Vitals:   02/20/12 1329  BP: 164/67  Pulse: 60  Temp: 97.4 F (36.3 C)  Resp: 18   Filed Vitals:   02/19/12 1956 02/20/12 0340 02/20/12 1105 02/20/12 1329  BP: 160/73 161/69 166/63 164/67  Pulse: 66 66 61 60  Temp: 98.6 F (37 C) 98 F (36.7 C)  97.4 F (36.3 C)  TempSrc: Oral Oral  Oral  Resp: 18 18  18   Height:      Weight:      SpO2: 100% 99%  100%    General exam: Caucasian male, no acute distress  HEENT: MMM  CV: RRR, no murmurs, rubs or gallops PULM:  CTAB Psych: A&Ox4.  Neuro: No tongue fasciculations or hand tremors MSK:  1+ bilateral DP pulses, warm extremities.    Discharge Instructions      Discharge Orders    Future Appointments: Provider: Department: Dept Phone: Center:   05/12/2012 8:30 AM [name], MD [facility] Main Office [place]) [phone] [facility]   05/12/2012 8:45 AM [facility] Main Office [place]) [phone] [facility]   08/08/2012 8:15 AM Corwin Levins, MD [facility] [phone] [facility]     Future Orders Please Complete By Expires   Diet - low sodium heart healthy      Diet Carb Modified      Increase activity slowly      Discharge instructions      Comments:   You were hospitalized with foot pain and were found to have severe peripheral vascular disease.  You will need surgery to improve the blood flow to your legs.  Please follow up with vascular surgery on Monday.  You will receive additional information from their office.  Your  blood pressure was elevated and you were started on a new blood pressure medication, norvasc (amlodipine).  Please take this medication once a day in addition to your other blood pressure medications.  Finally, you are at risk for injury to your kidney from IV contrast.  Please drink plenty of fluids.   Call MD for:  temperature >100.4      Call MD for:  persistant nausea and vomiting      Call MD for:  severe uncontrolled pain      Call MD for:  redness, tenderness, or signs of infection (pain, swelling, redness, odor or green/yellow discharge around incision site)      Call MD for:  difficulty breathing, headache or visual disturbances      Call MD for:  hives      Call MD for:  persistant dizziness or light-headedness      Call MD for:  extreme fatigue  Medication List     As of 02/20/2012  4:52 PM    TAKE these medications         amLODipine 5 MG tablet   Commonly known as: NORVASC   Take 1 tablet (5 mg total) by mouth daily.      aspirin 81 MG tablet   Take 81 mg by mouth daily.      atorvastatin 80 MG tablet   Commonly known as: LIPITOR   Take 1 tablet (80 mg total) by mouth daily.      baclofen 10 MG tablet   Commonly known as: LIORESAL   Take 10 mg by mouth Daily.      chlorproMAZINE 25 MG tablet   Commonly known as: THORAZINE   Take 25 mg by mouth daily.      dexlansoprazole 60 MG capsule   Commonly known as: DEXILANT   Take 1 capsule (60 mg total) by mouth daily.      ezetimibe 10 MG tablet   Commonly known as: ZETIA   Take 1 tablet (10 mg total) by mouth daily.      glimepiride 4 MG tablet   Commonly known as: AMARYL   Take 1 tablet (4 mg total) by mouth daily before breakfast.      lisinopril-hydrochlorothiazide 20-12.5 MG per tablet   Commonly known as: PRINZIDE,ZESTORETIC   Take 1 tablet by mouth daily.      metoprolol succinate 100 MG 24 hr tablet   Commonly known as: TOPROL-XL   Take 100 mg by mouth daily. Take with or immediately  following a meal.      nitroGLYCERIN 0.4 MG SL tablet   Commonly known as: NITROSTAT   Place 0.4 mg under the tongue every 5 (five) minutes as needed. For chest pain      oxyCODONE-acetaminophen 5-325 MG per tablet   Commonly known as: PERCOCET/ROXICET   Take 1-2 tablets by mouth every 3 (three) hours as needed.      pantoprazole 40 MG tablet   Commonly known as: PROTONIX   Take 40 mg by mouth daily.      ranitidine 150 MG tablet   Commonly known as: ZANTAC   TAKE 1 TABLET BY MOUTH TWICE A DAY        Follow-up Information    Follow up with Nilda Simmer, MD. On 02/25/2012.   Contact information:   [address] [place] [address] [phone]       Follow up with Jacob Barre, MD. In 2 weeks.   Contact information:   [address] [place] [address] [phone]           The results of significant diagnostics from this hospitalization (including imaging, microbiology, ancillary and laboratory) are listed below for reference.    Significant Diagnostic Studies: Dg Chest 2 View  02/20/2012  *RADIOLOGY REPORT*  Clinical Data: Preoperative respiratory evaluation prior to femoral bypass graft.  Prior history of stroke, CABG, and current history of diabetes, hypertension.  CHEST - 2 VIEW  Comparison: Two-view chest x-ray 06/25/2008, 05/28/2008.  Findings: Prior sternotomy for CABG.  Cardiac silhouette mildly enlarged but stable.  Thoracic aorta tortuous and atherosclerotic, unchanged.  Hilar and mediastinal contours otherwise unremarkable. Lungs clear.  Bronchovascular markings normal.  Pulmonary vascularity normal.  No pneumothorax.  No pleural effusions.  Mild degenerative changes involving the mid thoracic spine.  IMPRESSION: No acute cardiopulmonary disease.  Stable mild cardiomegaly.   Original Report Authenticated By:  Hulan Saas, M.D.    Ct Head Wo Contrast  02/20/2012  *RADIOLOGY REPORT*  Clinical Data: Confusion with  hallucinations.  CT HEAD WITHOUT CONTRAST  Technique:  Contiguous axial images were obtained from the base of the skull through the vertex without contrast.  Comparison: CTA head neck 08/18/2009.  Findings: There is no evidence for acute infarction, intracranial hemorrhage, mass lesion, hydrocephalus, or extra-axial fluid.  Mild premature atrophy.  Remote right thalamic lacune.  Mild chronic microvascular ischemic change particularly in the bifrontal regions.  Calvarium intact.  Moderate vascular calcification. Chronic sinus disease.  No mastoid fluid.  IMPRESSION: Mild premature atrophy and chronic microvascular ischemic change similar to priors.  No acute intracranial findings.   Original Report Authenticated By: Davonna Belling, M.D.     Microbiology: No results found for this or any previous visit (from the past 240 hour(s)).   Labs: Basic Metabolic Panel:  Lab 02/20/12 4782 02/19/12 1516 02/19/12 0450 02/18/12 1743  NA 135 -- 141 137  K 4.4 -- 4.3 3.6  CL 97 -- 102 98  CO2 23 -- 29 --  GLUCOSE 232* -- 116* 182*  BUN 16 -- 9 8  CREATININE 1.61* 1.34 1.56* 1.30  CALCIUM 9.7 -- 9.1 --  MG -- -- -- --  PHOS -- -- -- --   Liver Function Tests:  Lab 02/20/12 1450  AST 13  ALT 7  ALKPHOS 94  BILITOT 0.4  PROT 7.2  ALBUMIN 3.3*   No results found for this basename: LIPASE:5,AMYLASE:5 in the last 168 hours  Lab 02/20/12 1250  AMMONIA 18   CBC:  Lab 02/20/12 0515 02/19/12 1516 02/19/12 0450 02/18/12 1743 02/18/12 1420  WBC 8.0 7.7 7.1 -- 8.8  NEUTROABS -- -- -- -- 6.7  HGB 14.5 14.1 14.3 15.3 15.1  HCT 41.9 41.1 42.0 45.0 43.6  MCV 84.8 85.6 87.0 -- 86.3  PLT 232 202 204 -- 247   Cardiac Enzymes: No results found for this basename: CKTOTAL:5,CKMB:5,CKMBINDEX:5,TROPONINI:5 in the last 168 hours BNP: BNP (last 3 results) No results found for this basename: PROBNP:3 in the last 8760 hours CBG:  Lab 02/20/12 1615 02/20/12 1110 02/20/12 0554 02/19/12 2159 02/19/12 1852  GLUCAP  178* 193* 231* 218* 338*    Time coordinating discharge: 45  minutes  Signed:  Spurgeon Gancarz  [facility] 02/20/2012, 4:52 PM

## 2012-02-22 ENCOUNTER — Other Ambulatory Visit: Payer: Self-pay | Admitting: Cardiology

## 2012-02-22 ENCOUNTER — Encounter (HOSPITAL_COMMUNITY): Payer: Self-pay

## 2012-02-22 ENCOUNTER — Encounter (HOSPITAL_COMMUNITY)
Admission: RE | Admit: 2012-02-22 | Discharge: 2012-02-22 | Disposition: A | Discharge status: Home / Self Care | Payer: Medicare Other | Source: Ambulatory Visit | Attending: Vascular Surgery | Admitting: Vascular Surgery

## 2012-02-22 DIAGNOSIS — E785 Hyperlipidemia, unspecified: Secondary | ICD-10-CM

## 2012-02-22 DIAGNOSIS — E7800 Pure hypercholesterolemia, unspecified: Secondary | ICD-10-CM

## 2012-02-22 DIAGNOSIS — I119 Hypertensive heart disease without heart failure: Secondary | ICD-10-CM

## 2012-02-22 LAB — SURGICAL PCR SCREEN: Staphylococcus aureus: NEGATIVE

## 2012-02-22 LAB — CBC
HCT: 40.9 % (ref 39.0–52.0)
Hemoglobin: 13.4 g/dL (ref 13.0–17.0)
MCHC: 32.8 g/dL (ref 30.0–36.0)
WBC: 5.5 10*3/uL (ref 4.0–10.5)

## 2012-02-22 LAB — BASIC METABOLIC PANEL
CO2: 28 mEq/L (ref 19–32)
Calcium: 9.2 mg/dL (ref 8.4–10.5)
Creatinine, Ser: 1.47 mg/dL — ABNORMAL HIGH (ref 0.50–1.35)
GFR calc Af Amer: 57 mL/min — ABNORMAL LOW (ref >90)
Glucose, Bld: 186 mg/dL — ABNORMAL HIGH (ref 70–99)
Sodium: 137 mEq/L (ref 135–145)

## 2012-02-22 MED ORDER — EZETIMIBE 10 MG PO TABS: 10.0000 mg | ORAL_TABLET | Freq: Every day | ORAL | Status: DC

## 2012-02-22 MED ORDER — LISINOPRIL-HYDROCHLOROTHIAZIDE 20-12.5 MG PO TABS: 1.0000 | ORAL_TABLET | Freq: Every day | ORAL | Status: DC

## 2012-02-22 NOTE — Pre-Procedure Instructions (Signed)
20 [name]  02/22/2012   Your procedure is scheduled on:  Monday February 25, 2012.  Report to [facility] at 0530 AM.  Call this number if you have problems the morning of surgery: [phone]   Remember:   Do not eat food or drink:After Midnight.    Take these medicines the morning of surgery with A SIP OF WATER: Amlodipine (Norvasc), Chlorpromazine (Thorazine), Metoprolol (Toprol XL), Oxycodone (Percocet) if needed for pain, & Ranitidine (Zantac)   Do not wear jewelry  Do not wear lotions or colognes. You may NOT wear deodorant.  Men may shave face and neck.  Do not bring valuables to the hospital.  Contacts, dentures or bridgework may not be worn into surgery.  Leave suitcase in the car. After surgery it may be brought to your room.  For patients admitted to the hospital, checkout time is 11:00 AM the day of discharge.   Patients discharged the day of surgery will not be allowed to drive home.  Name and phone number of your driver:   Special Instructions: Shower using CHG 2 nights before surgery and the night before surgery.  If you shower the day of surgery use CHG.  Use special wash - you have one bottle of CHG for all showers.  You should use approximately 1/3 of the bottle for each shower.   Please read over the following fact sheets that you were given: Pain Booklet, Coughing and Deep Breathing, Blood Transfusion Information, MRSA Information and Surgical Site Infection Prevention

## 2012-02-22 NOTE — Progress Notes (Signed)
Dr. Patty Sermons is Cardiologist and encounters are in [facility]. Dr. Lewayne Bunting handles patients pacemaker however patient is unaware of the type or name of device. Implanted cardiac device faxed to [facility] STAT as patients surgery is Monday. PCP is Dr. Oliver Barre. Patient denied having a sleep study.

## 2012-02-22 NOTE — Consult Note (Signed)
Anesthesia brief note: (Given chart to review after 1630 the weekday before surgery.) Patient is for right to left FFBG and left FTBG on 02/25/12 by Dr. Imogene Burn.  History reviewed.  EKG, CXR, and labs noted.  Echo from 05/17/08 under Results Review tab. PCP is Dr. Oliver Barre.  Primary Cardiologist is Dr. Patty Sermons.  EP Cardiologist is Dr. Ladona Ridgel.  Completed [employer] PPM peri-operative Rx form on chart.  Cardiac clearance note from Dr. Daleen Squibb dated 02/19/12 is on chart and can be viewed under the Notes tab.  Patient will be evaluated by his assigned Anesthesiologist on the day of surgery.  Shonna Chock, PA-C 02/22/12 1649

## 2012-02-24 MED ORDER — CEFUROXIME SODIUM 1.5 G IJ SOLR
1.5000 g | INTRAMUSCULAR | Status: AC
  Administered 2012-02-25 (×2): .75 g via INTRAVENOUS
  Administered 2012-02-25: 1.5 g via INTRAVENOUS
  Filled 2012-02-24: qty 1.5

## 2012-02-25 ENCOUNTER — Other Ambulatory Visit: Payer: Self-pay

## 2012-02-25 ENCOUNTER — Encounter (HOSPITAL_COMMUNITY): Payer: Self-pay | Admitting: Anesthesiology

## 2012-02-25 ENCOUNTER — Encounter (HOSPITAL_COMMUNITY)
Admission: RE | Disposition: A | Discharge status: Home Health | Payer: Self-pay | Source: Ambulatory Visit | Attending: Vascular Surgery

## 2012-02-25 ENCOUNTER — Encounter (HOSPITAL_COMMUNITY): Payer: Self-pay

## 2012-02-25 ENCOUNTER — Inpatient Hospital Stay (HOSPITAL_COMMUNITY)
Admission: RE | Admit: 2012-02-25 | Discharge: 2012-02-29 | DRG: 238 | Disposition: A | Discharge status: Home Health | Payer: Medicare Other | Source: Ambulatory Visit | Attending: Vascular Surgery | Admitting: Vascular Surgery

## 2012-02-25 ENCOUNTER — Encounter (HOSPITAL_COMMUNITY): Payer: Self-pay | Admitting: Vascular Surgery

## 2012-02-25 ENCOUNTER — Inpatient Hospital Stay (HOSPITAL_COMMUNITY): Payer: Medicare Other

## 2012-02-25 ENCOUNTER — Inpatient Hospital Stay (HOSPITAL_COMMUNITY): Payer: Medicare Other | Admitting: Anesthesiology

## 2012-02-25 ENCOUNTER — Encounter (HOSPITAL_COMMUNITY): Payer: Self-pay | Admitting: *Deleted

## 2012-02-25 DIAGNOSIS — I70209 Unspecified atherosclerosis of native arteries of extremities, unspecified extremity: Principal | ICD-10-CM | POA: Diagnosis present

## 2012-02-25 DIAGNOSIS — Z720 Tobacco use: Secondary | ICD-10-CM

## 2012-02-25 DIAGNOSIS — E119 Type 2 diabetes mellitus without complications: Secondary | ICD-10-CM

## 2012-02-25 DIAGNOSIS — Z79899 Other long term (current) drug therapy: Secondary | ICD-10-CM

## 2012-02-25 DIAGNOSIS — D62 Acute posthemorrhagic anemia: Secondary | ICD-10-CM | POA: Diagnosis present

## 2012-02-25 DIAGNOSIS — I70229 Atherosclerosis of native arteries of extremities with rest pain, unspecified extremity: Secondary | ICD-10-CM

## 2012-02-25 DIAGNOSIS — I998 Other disorder of circulatory system: Secondary | ICD-10-CM

## 2012-02-25 DIAGNOSIS — Z8673 Personal history of transient ischemic attack (TIA), and cerebral infarction without residual deficits: Secondary | ICD-10-CM

## 2012-02-25 DIAGNOSIS — E876 Hypokalemia: Secondary | ICD-10-CM | POA: Diagnosis present

## 2012-02-25 DIAGNOSIS — R066 Hiccough: Secondary | ICD-10-CM

## 2012-02-25 DIAGNOSIS — E7800 Pure hypercholesterolemia, unspecified: Secondary | ICD-10-CM

## 2012-02-25 DIAGNOSIS — I119 Hypertensive heart disease without heart failure: Secondary | ICD-10-CM

## 2012-02-25 DIAGNOSIS — I252 Old myocardial infarction: Secondary | ICD-10-CM

## 2012-02-25 DIAGNOSIS — Z7982 Long term (current) use of aspirin: Secondary | ICD-10-CM

## 2012-02-25 DIAGNOSIS — I743 Embolism and thrombosis of arteries of the lower extremities: Secondary | ICD-10-CM

## 2012-02-25 DIAGNOSIS — I639 Cerebral infarction, unspecified: Secondary | ICD-10-CM

## 2012-02-25 DIAGNOSIS — Z951 Presence of aortocoronary bypass graft: Secondary | ICD-10-CM

## 2012-02-25 DIAGNOSIS — E785 Hyperlipidemia, unspecified: Secondary | ICD-10-CM

## 2012-02-25 DIAGNOSIS — Z950 Presence of cardiac pacemaker: Secondary | ICD-10-CM

## 2012-02-25 DIAGNOSIS — K219 Gastro-esophageal reflux disease without esophagitis: Secondary | ICD-10-CM | POA: Diagnosis present

## 2012-02-25 DIAGNOSIS — I2510 Atherosclerotic heart disease of native coronary artery without angina pectoris: Secondary | ICD-10-CM | POA: Diagnosis present

## 2012-02-25 HISTORY — PX: FEMORAL-TIBIAL BYPASS GRAFT: SHX938

## 2012-02-25 HISTORY — PX: INTRAOPERATIVE ARTERIOGRAM: SHX5157

## 2012-02-25 HISTORY — PX: PATCH ANGIOPLASTY: SHX6230

## 2012-02-25 HISTORY — PX: ENDARTERECTOMY FEMORAL: SHX5804

## 2012-02-25 HISTORY — PX: FEMORAL-FEMORAL BYPASS GRAFT: SHX936

## 2012-02-25 LAB — CBC
HCT: 32.7 % — ABNORMAL LOW (ref 39.0–52.0)
HCT: 33.5 % — ABNORMAL LOW (ref 39.0–52.0)
Hemoglobin: 11.3 g/dL — ABNORMAL LOW (ref 13.0–17.0)
Hemoglobin: 11.8 g/dL — ABNORMAL LOW (ref 13.0–17.0)
MCHC: 35.2 g/dL (ref 30.0–36.0)
MCV: 83.8 fL (ref 78.0–100.0)
RBC: 3.89 MIL/uL — ABNORMAL LOW (ref 4.22–5.81)
RDW: 14.3 % (ref 11.5–15.5)
WBC: 11.8 10*3/uL — ABNORMAL HIGH (ref 4.0–10.5)
WBC: 14.1 10*3/uL — ABNORMAL HIGH (ref 4.0–10.5)

## 2012-02-25 LAB — POCT I-STAT 7, (LYTES, BLD GAS, ICA,H+H)
Acid-Base Excess: 4 mmol/L — ABNORMAL HIGH (ref 0.0–2.0)
Bicarbonate: 28 mEq/L — ABNORMAL HIGH (ref 20.0–24.0)
O2 Saturation: 100 %
Potassium: 3.7 mEq/L (ref 3.5–5.1)
Sodium: 136 mEq/L (ref 135–145)
TCO2: 29 mmol/L (ref 0–100)
pH, Arterial: 7.451 — ABNORMAL HIGH (ref 7.350–7.450)

## 2012-02-25 LAB — GLUCOSE, CAPILLARY: Glucose-Capillary: 133 mg/dL — ABNORMAL HIGH (ref 70–99)

## 2012-02-25 LAB — POCT I-STAT 4, (NA,K, GLUC, HGB,HCT)
Glucose, Bld: 168 mg/dL — ABNORMAL HIGH (ref 70–99)
Potassium: 3.7 mEq/L (ref 3.5–5.1)
Sodium: 136 mEq/L (ref 135–145)

## 2012-02-25 SURGERY — CREATION, BYPASS, ARTERIAL, FEMORAL TO FEMORAL, USING GRAFT
Anesthesia: General | Site: Leg Upper | Wound class: Clean

## 2012-02-25 MED ORDER — SENNOSIDES-DOCUSATE SODIUM 8.6-50 MG PO TABS
1.0000 | ORAL_TABLET | Freq: Every evening | ORAL | Status: DC | PRN
  Filled 2012-02-25: qty 1

## 2012-02-25 MED ORDER — ONDANSETRON HCL 4 MG/2ML IJ SOLN
INTRAMUSCULAR | Status: DC | PRN
  Administered 2012-02-25: 4 mg via INTRAVENOUS

## 2012-02-25 MED ORDER — POTASSIUM CHLORIDE CRYS ER 20 MEQ PO TBCR: 20.0000 meq | EXTENDED_RELEASE_TABLET | Freq: Once | ORAL | Status: AC | PRN

## 2012-02-25 MED ORDER — MORPHINE SULFATE 2 MG/ML IJ SOLN
2.0000 mg | INTRAMUSCULAR | Status: DC | PRN
  Filled 2012-02-25: qty 2

## 2012-02-25 MED ORDER — PHENYLEPHRINE HCL 10 MG/ML IJ SOLN
INTRAMUSCULAR | Status: DC | PRN
  Administered 2012-02-25 (×3): 80 ug via INTRAVENOUS

## 2012-02-25 MED ORDER — THROMBIN 20000 UNITS EX SOLR
CUTANEOUS | Status: DC | PRN
  Administered 2012-02-25 (×2): via TOPICAL

## 2012-02-25 MED ORDER — ATORVASTATIN CALCIUM 80 MG PO TABS
80.0000 mg | ORAL_TABLET | Freq: Every day | ORAL | Status: DC
  Administered 2012-02-25 – 2012-02-29 (×5): 80 mg via ORAL
  Filled 2012-02-25 (×5): qty 1

## 2012-02-25 MED ORDER — SODIUM CHLORIDE 0.9 % IV SOLN: 500.0000 mL | Freq: Once | INTRAVENOUS | Status: AC | PRN

## 2012-02-25 MED ORDER — FENTANYL CITRATE 0.05 MG/ML IJ SOLN
INTRAMUSCULAR | Status: DC | PRN
  Administered 2012-02-25 (×5): 50 ug via INTRAVENOUS
  Administered 2012-02-25: 100 ug via INTRAVENOUS
  Administered 2012-02-25: 50 ug via INTRAVENOUS
  Administered 2012-02-25: 100 ug via INTRAVENOUS

## 2012-02-25 MED ORDER — NITROGLYCERIN 0.4 MG SL SUBL: 0.4000 mg | SUBLINGUAL_TABLET | SUBLINGUAL | Status: DC | PRN

## 2012-02-25 MED ORDER — EZETIMIBE 10 MG PO TABS: 10.0000 mg | ORAL_TABLET | Freq: Every day | ORAL | Status: DC

## 2012-02-25 MED ORDER — THROMBIN 20000 UNITS EX SOLR
CUTANEOUS | Status: AC
  Filled 2012-02-25: qty 20000

## 2012-02-25 MED ORDER — MIDAZOLAM HCL 5 MG/5ML IJ SOLN
INTRAMUSCULAR | Status: DC | PRN
  Administered 2012-02-25: 2 mg via INTRAVENOUS

## 2012-02-25 MED ORDER — DOPAMINE-DEXTROSE 3.2-5 MG/ML-% IV SOLN: 3.0000 ug/kg/min | INTRAVENOUS | Status: DC

## 2012-02-25 MED ORDER — CHLORPROMAZINE HCL 25 MG PO TABS
25.0000 mg | ORAL_TABLET | Freq: Every day | ORAL | Status: DC
  Administered 2012-02-25 – 2012-02-28 (×4): 25 mg via ORAL
  Filled 2012-02-25 (×5): qty 1

## 2012-02-25 MED ORDER — PROPOFOL 10 MG/ML IV BOLUS
INTRAVENOUS | Status: DC | PRN
  Administered 2012-02-25: 160 mg via INTRAVENOUS

## 2012-02-25 MED ORDER — LISINOPRIL 20 MG PO TABS
20.0000 mg | ORAL_TABLET | Freq: Every day | ORAL | Status: DC
  Administered 2012-02-25 – 2012-02-29 (×5): 20 mg via ORAL
  Filled 2012-02-25 (×5): qty 1

## 2012-02-25 MED ORDER — OXYCODONE HCL 5 MG PO TABS: 5.0000 mg | ORAL_TABLET | Freq: Once | ORAL | Status: DC | PRN

## 2012-02-25 MED ORDER — MAGNESIUM SULFATE 40 MG/ML IJ SOLN
2.0000 g | Freq: Once | INTRAMUSCULAR | Status: AC | PRN
  Filled 2012-02-25: qty 50

## 2012-02-25 MED ORDER — OXYCODONE-ACETAMINOPHEN 5-325 MG PO TABS: 1.0000 | ORAL_TABLET | ORAL | Status: DC | PRN

## 2012-02-25 MED ORDER — HYDROCHLOROTHIAZIDE 12.5 MG PO CAPS
12.5000 mg | ORAL_CAPSULE | Freq: Every day | ORAL | Status: DC
  Administered 2012-02-25 – 2012-02-29 (×5): 12.5 mg via ORAL
  Filled 2012-02-25 (×5): qty 1

## 2012-02-25 MED ORDER — PANTOPRAZOLE SODIUM 40 MG PO TBEC
40.0000 mg | DELAYED_RELEASE_TABLET | Freq: Every day | ORAL | Status: DC
  Administered 2012-02-26 – 2012-02-29 (×4): 40 mg via ORAL
  Filled 2012-02-25 (×4): qty 1

## 2012-02-25 MED ORDER — PROTAMINE SULFATE 10 MG/ML IV SOLN
INTRAVENOUS | Status: DC | PRN
  Administered 2012-02-25: 50 mg via INTRAVENOUS

## 2012-02-25 MED ORDER — METOPROLOL SUCCINATE ER 100 MG PO TB24
100.0000 mg | ORAL_TABLET | ORAL | Status: AC
  Administered 2012-02-25: 100 mg via ORAL
  Filled 2012-02-25: qty 1

## 2012-02-25 MED ORDER — 0.9 % SODIUM CHLORIDE (POUR BTL) OPTIME
TOPICAL | Status: DC | PRN
  Administered 2012-02-25: 1000 mL
  Administered 2012-02-25: 2000 mL

## 2012-02-25 MED ORDER — METOPROLOL SUCCINATE ER 100 MG PO TB24
100.0000 mg | ORAL_TABLET | Freq: Every day | ORAL | Status: DC
  Administered 2012-02-26 – 2012-02-29 (×4): 100 mg via ORAL
  Filled 2012-02-25 (×4): qty 1

## 2012-02-25 MED ORDER — PHENOL 1.4 % MT LIQD: 1.0000 | OROMUCOSAL | Status: DC | PRN

## 2012-02-25 MED ORDER — ONDANSETRON HCL 4 MG/2ML IJ SOLN: 4.0000 mg | Freq: Four times a day (QID) | INTRAMUSCULAR | Status: DC | PRN

## 2012-02-25 MED ORDER — DEXTROSE 5 % IV SOLN
750.0000 mg | INTRAVENOUS | Status: DC
  Filled 2012-02-25: qty 750

## 2012-02-25 MED ORDER — HYDRALAZINE HCL 20 MG/ML IJ SOLN
INTRAMUSCULAR | Status: AC
  Filled 2012-02-25: qty 1

## 2012-02-25 MED ORDER — ASPIRIN 81 MG PO TABS: 81.0000 mg | ORAL_TABLET | Freq: Every day | ORAL | Status: DC

## 2012-02-25 MED ORDER — ARTIFICIAL TEARS OP OINT
TOPICAL_OINTMENT | OPHTHALMIC | Status: DC | PRN
  Administered 2012-02-25: 1 via OPHTHALMIC

## 2012-02-25 MED ORDER — ALUM & MAG HYDROXIDE-SIMETH 200-200-20 MG/5ML PO SUSP: 15.0000 mL | ORAL | Status: DC | PRN

## 2012-02-25 MED ORDER — ACETAMINOPHEN 325 MG PO TABS
325.0000 mg | ORAL_TABLET | ORAL | Status: DC | PRN
  Administered 2012-02-26 – 2012-02-29 (×2): 650 mg via ORAL
  Filled 2012-02-25 (×2): qty 2

## 2012-02-25 MED ORDER — PHENYLEPHRINE HCL 10 MG/ML IJ SOLN
10.0000 mg | INTRAVENOUS | Status: DC | PRN
  Administered 2012-02-25: 10 ug/min via INTRAVENOUS

## 2012-02-25 MED ORDER — AMLODIPINE BESYLATE 5 MG PO TABS
5.0000 mg | ORAL_TABLET | Freq: Every day | ORAL | Status: DC
  Administered 2012-02-25 – 2012-02-29 (×5): 5 mg via ORAL
  Filled 2012-02-25 (×5): qty 1

## 2012-02-25 MED ORDER — GUAIFENESIN-DM 100-10 MG/5ML PO SYRP: 15.0000 mL | ORAL_SOLUTION | ORAL | Status: DC | PRN

## 2012-02-25 MED ORDER — CEFUROXIME SODIUM 1.5 G IJ SOLR
1.5000 g | Freq: Two times a day (BID) | INTRAMUSCULAR | Status: AC
  Administered 2012-02-26 (×2): 1.5 g via INTRAVENOUS
  Filled 2012-02-25 (×2): qty 1.5

## 2012-02-25 MED ORDER — BISACODYL 10 MG RE SUPP: 10.0000 mg | Freq: Every day | RECTAL | Status: DC | PRN

## 2012-02-25 MED ORDER — HEPARIN SODIUM (PORCINE) 1000 UNIT/ML IJ SOLN
INTRAMUSCULAR | Status: DC | PRN
  Administered 2012-02-25 (×2): 2000 [IU] via INTRAVENOUS
  Administered 2012-02-25: 7000 [IU] via INTRAVENOUS
  Administered 2012-02-25 (×3): 2000 [IU] via INTRAVENOUS

## 2012-02-25 MED ORDER — SODIUM CHLORIDE 0.9 % IR SOLN
Status: DC | PRN
  Administered 2012-02-25 (×2)

## 2012-02-25 MED ORDER — IOHEXOL 300 MG/ML  SOLN
INTRAMUSCULAR | Status: DC | PRN
  Administered 2012-02-25 (×2): 50 mL

## 2012-02-25 MED ORDER — ROCURONIUM BROMIDE 100 MG/10ML IV SOLN
INTRAVENOUS | Status: DC | PRN
  Administered 2012-02-25: 20 mg via INTRAVENOUS
  Administered 2012-02-25: 10 mg via INTRAVENOUS
  Administered 2012-02-25: 60 mg via INTRAVENOUS
  Administered 2012-02-25: 10 mg via INTRAVENOUS

## 2012-02-25 MED ORDER — ACETAMINOPHEN 650 MG RE SUPP: 325.0000 mg | RECTAL | Status: DC | PRN

## 2012-02-25 MED ORDER — SODIUM CHLORIDE 0.9 % IV SOLN: INTRAVENOUS | Status: DC

## 2012-02-25 MED ORDER — ALBUMIN HUMAN 5 % IV SOLN
INTRAVENOUS | Status: DC | PRN
  Administered 2012-02-25 (×2): via INTRAVENOUS

## 2012-02-25 MED ORDER — POTASSIUM CHLORIDE IN NACL 20-0.9 MEQ/L-% IV SOLN
INTRAVENOUS | Status: DC
  Administered 2012-02-25: 19:00:00 via INTRAVENOUS
  Filled 2012-02-25 (×3): qty 1000

## 2012-02-25 MED ORDER — LACTATED RINGERS IV SOLN
INTRAVENOUS | Status: DC | PRN
  Administered 2012-02-25 (×4): via INTRAVENOUS

## 2012-02-25 MED ORDER — CEFUROXIME SODIUM 750 MG IJ SOLR
INTRAMUSCULAR | Status: AC
  Filled 2012-02-25: qty 750

## 2012-02-25 MED ORDER — EZETIMIBE 10 MG PO TABS
10.0000 mg | ORAL_TABLET | Freq: Every day | ORAL | Status: DC
  Administered 2012-02-25 – 2012-02-29 (×5): 10 mg via ORAL
  Filled 2012-02-25 (×5): qty 1

## 2012-02-25 MED ORDER — BACLOFEN 10 MG PO TABS
10.0000 mg | ORAL_TABLET | Freq: Two times a day (BID) | ORAL | Status: DC
  Administered 2012-02-25 – 2012-02-29 (×8): 10 mg via ORAL
  Filled 2012-02-25 (×9): qty 1

## 2012-02-25 MED ORDER — PROMETHAZINE HCL 25 MG/ML IJ SOLN: 6.2500 mg | INTRAMUSCULAR | Status: DC | PRN

## 2012-02-25 MED ORDER — LISINOPRIL-HYDROCHLOROTHIAZIDE 20-12.5 MG PO TABS: 1.0000 | ORAL_TABLET | Freq: Every day | ORAL | Status: DC

## 2012-02-25 MED ORDER — LIDOCAINE HCL (CARDIAC) 20 MG/ML IV SOLN
INTRAVENOUS | Status: DC | PRN
  Administered 2012-02-25: 100 mg via INTRAVENOUS

## 2012-02-25 MED ORDER — GLIMEPIRIDE 4 MG PO TABS: 4.0000 mg | ORAL_TABLET | Freq: Every day | ORAL | Status: DC

## 2012-02-25 MED ORDER — HYDRALAZINE HCL 20 MG/ML IJ SOLN
10.0000 mg | INTRAMUSCULAR | Status: DC | PRN
  Administered 2012-02-25: 10 mg via INTRAVENOUS
  Filled 2012-02-25: qty 0.5

## 2012-02-25 MED ORDER — MIDAZOLAM HCL 2 MG/2ML IJ SOLN: 1.0000 mg | INTRAMUSCULAR | Status: DC | PRN

## 2012-02-25 MED ORDER — FENTANYL CITRATE 0.05 MG/ML IJ SOLN: 50.0000 ug | Freq: Once | INTRAMUSCULAR | Status: DC

## 2012-02-25 MED ORDER — OXYCODONE HCL 5 MG/5ML PO SOLN: 5.0000 mg | Freq: Once | ORAL | Status: DC | PRN

## 2012-02-25 MED ORDER — METOPROLOL TARTRATE 1 MG/ML IV SOLN: 2.0000 mg | INTRAVENOUS | Status: DC | PRN

## 2012-02-25 MED ORDER — HYDROMORPHONE HCL PF 1 MG/ML IJ SOLN: 0.2500 mg | INTRAMUSCULAR | Status: DC | PRN

## 2012-02-25 MED ORDER — GLIMEPIRIDE 4 MG PO TABS
4.0000 mg | ORAL_TABLET | Freq: Every day | ORAL | Status: DC
  Administered 2012-02-26 – 2012-02-29 (×4): 4 mg via ORAL
  Filled 2012-02-25 (×5): qty 1

## 2012-02-25 MED ORDER — LABETALOL HCL 5 MG/ML IV SOLN
10.0000 mg | INTRAVENOUS | Status: DC | PRN
  Filled 2012-02-25: qty 4

## 2012-02-25 MED ORDER — DOCUSATE SODIUM 100 MG PO CAPS
100.0000 mg | ORAL_CAPSULE | Freq: Every day | ORAL | Status: DC
  Administered 2012-02-26 – 2012-02-29 (×4): 100 mg via ORAL
  Filled 2012-02-25 (×4): qty 1

## 2012-02-25 MED ORDER — LACTATED RINGERS IV SOLN
INTRAVENOUS | Status: DC | PRN
  Administered 2012-02-25: 08:00:00 via INTRAVENOUS

## 2012-02-25 MED ORDER — PANTOPRAZOLE SODIUM 40 MG PO TBEC: 40.0000 mg | DELAYED_RELEASE_TABLET | Freq: Every day | ORAL | Status: DC

## 2012-02-25 MED ORDER — FAMOTIDINE 20 MG PO TABS
20.0000 mg | ORAL_TABLET | Freq: Every day | ORAL | Status: DC
  Administered 2012-02-25 – 2012-02-28 (×4): 20 mg via ORAL
  Filled 2012-02-25 (×5): qty 1

## 2012-02-25 MED ORDER — ASPIRIN 81 MG PO CHEW
81.0000 mg | CHEWABLE_TABLET | Freq: Every day | ORAL | Status: DC
  Administered 2012-02-25 – 2012-02-29 (×5): 81 mg via ORAL
  Filled 2012-02-25 (×5): qty 1

## 2012-02-25 SURGICAL SUPPLY — 100 items
ADH SKN CLS APL DERMABOND .7 (GAUZE/BANDAGES/DRESSINGS) ×9
BANDAGE ELASTIC 4 VELCRO ST LF (GAUZE/BANDAGES/DRESSINGS) IMPLANT
BANDAGE ESMARK 6X9 LF (GAUZE/BANDAGES/DRESSINGS) IMPLANT
BLADE SURG 10 STRL SS (BLADE) ×1 IMPLANT
BLADE SURG CLIPPER 3M 9600 (MISCELLANEOUS) ×1 IMPLANT
BNDG CMPR 9X6 STRL LF SNTH (GAUZE/BANDAGES/DRESSINGS)
BNDG ESMARK 6X9 LF (GAUZE/BANDAGES/DRESSINGS)
BOOT SUTURE AID YELLOW STND (SUTURE) ×1 IMPLANT
CANISTER SUCTION 2500CC (MISCELLANEOUS) ×4 IMPLANT
CLIP TI MEDIUM 24 (CLIP) ×4 IMPLANT
CLIP TI WIDE RED SMALL 24 (CLIP) ×5 IMPLANT
CLOTH BEACON ORANGE TIMEOUT ST (SAFETY) ×4 IMPLANT
CONT SPEC STER OR (MISCELLANEOUS) ×1 IMPLANT
COVER PROBE W GEL 5X96 (DRAPES) ×5 IMPLANT
COVER SURGICAL LIGHT HANDLE (MISCELLANEOUS) ×5 IMPLANT
CUFF TOURNIQUET SINGLE 18IN (TOURNIQUET CUFF) IMPLANT
CUFF TOURNIQUET SINGLE 24IN (TOURNIQUET CUFF) IMPLANT
CUFF TOURNIQUET SINGLE 34IN LL (TOURNIQUET CUFF) IMPLANT
CUFF TOURNIQUET SINGLE 44IN (TOURNIQUET CUFF) IMPLANT
DERMABOND ADVANCED (GAUZE/BANDAGES/DRESSINGS) ×3
DERMABOND ADVANCED .7 DNX12 (GAUZE/BANDAGES/DRESSINGS) IMPLANT
DRAIN CHANNEL 15F RND FF W/TCR (WOUND CARE) IMPLANT
DRAPE C-ARM 42X72 X-RAY (DRAPES) ×5 IMPLANT
DRAPE WARM FLUID 44X44 (DRAPE) ×4 IMPLANT
DRSG COVADERM 4X10 (GAUZE/BANDAGES/DRESSINGS) ×1 IMPLANT
DRSG COVADERM 4X14 (GAUZE/BANDAGES/DRESSINGS) ×1 IMPLANT
DRSG COVADERM 4X8 (GAUZE/BANDAGES/DRESSINGS) IMPLANT
ELECT CAUTERY BLADE 6.4 (BLADE) ×1 IMPLANT
ELECT REM PT RETURN 9FT ADLT (ELECTROSURGICAL) ×4
ELECTRODE REM PT RTRN 9FT ADLT (ELECTROSURGICAL) ×3 IMPLANT
EVACUATOR SILICONE 100CC (DRAIN) IMPLANT
GAUZE SPONGE 4X4 16PLY XRAY LF (GAUZE/BANDAGES/DRESSINGS) ×4 IMPLANT
GLOVE BIO SURGEON STRL SZ7 (GLOVE) ×8 IMPLANT
GLOVE BIOGEL M 6.5 STRL (GLOVE) ×4 IMPLANT
GLOVE BIOGEL PI IND STRL 6.5 (GLOVE) IMPLANT
GLOVE BIOGEL PI IND STRL 7.5 (GLOVE) ×3 IMPLANT
GLOVE BIOGEL PI INDICATOR 6.5 (GLOVE) ×7
GLOVE BIOGEL PI INDICATOR 7.5 (GLOVE) ×5
GLOVE ECLIPSE 7.5 STRL STRAW (GLOVE) ×1 IMPLANT
GLOVE ORTHOPEDIC STR SZ6.5 (GLOVE) ×1 IMPLANT
GLOVE SS BIOGEL STRL SZ 6.5 (GLOVE) IMPLANT
GLOVE SS BIOGEL STRL SZ 7 (GLOVE) IMPLANT
GLOVE SUPERSENSE BIOGEL SZ 6.5 (GLOVE) ×4
GLOVE SUPERSENSE BIOGEL SZ 7 (GLOVE) ×4
GLOVE SURG SS PI 6.5 STRL IVOR (GLOVE) ×3 IMPLANT
GLOVE SURG SS PI 7.0 STRL IVOR (GLOVE) ×7 IMPLANT
GLOVE SURG SS PI 7.5 STRL IVOR (GLOVE) ×1 IMPLANT
GOWN EXTRA PROTECTION XL (GOWNS) ×5 IMPLANT
GOWN PREVENTION PLUS XLARGE (GOWN DISPOSABLE) ×2 IMPLANT
GOWN STRL NON-REIN LRG LVL3 (GOWN DISPOSABLE) ×16 IMPLANT
GOWN STRL REIN XL XLG (GOWN DISPOSABLE) ×7 IMPLANT
GRAFT HEMASHIELD 8MM (Vascular Products) ×4 IMPLANT
GRAFT PROPATEN W/RING 6X80X60 (Vascular Products) ×1 IMPLANT
GRAFT VASC STRG 30X8KNIT (Vascular Products) IMPLANT
HEMOSTAT SURGICEL 2X14 (HEMOSTASIS) IMPLANT
INSERT FOGARTY SM (MISCELLANEOUS) ×4 IMPLANT
KIT BASIN OR (CUSTOM PROCEDURE TRAY) ×4 IMPLANT
KIT ROOM TURNOVER OR (KITS) ×4 IMPLANT
LOOP VESSEL MINI RED (MISCELLANEOUS) ×1 IMPLANT
MARKER GRAFT CORONARY BYPASS (MISCELLANEOUS) IMPLANT
NDL PERC 18GX7CM (NEEDLE) IMPLANT
NEEDLE PERC 18GX7CM (NEEDLE) ×4 IMPLANT
NS IRRIG 1000ML POUR BTL (IV SOLUTION) ×9 IMPLANT
PACK PERIPHERAL VASCULAR (CUSTOM PROCEDURE TRAY) ×4 IMPLANT
PAD ARMBOARD 7.5X6 YLW CONV (MISCELLANEOUS) ×8 IMPLANT
PADDING CAST COTTON 6X4 STRL (CAST SUPPLIES) IMPLANT
PATCH VASCULAR VASCU GUARD 1X6 (Vascular Products) ×2 IMPLANT
PENCIL BUTTON HOLSTER BLD 10FT (ELECTRODE) ×1 IMPLANT
SET COLLECT BLD 21X3/4 12 PB (MISCELLANEOUS) ×1 IMPLANT
SHEATH AVANTI 11CM 5FR (MISCELLANEOUS) ×1 IMPLANT
SPECIMEN JAR SMALL (MISCELLANEOUS) ×2 IMPLANT
SPONGE INTESTINAL PEANUT (DISPOSABLE) ×1 IMPLANT
SPONGE LAP 18X18 X RAY DECT (DISPOSABLE) ×1 IMPLANT
SPONGE SURGIFOAM ABS GEL 100 (HEMOSTASIS) ×2 IMPLANT
STAPLER VISISTAT 35W (STAPLE) ×3 IMPLANT
STOPCOCK 4 WAY LG BORE MALE ST (IV SETS) ×4 IMPLANT
SUT MNCRL AB 4-0 PS2 18 (SUTURE) ×9 IMPLANT
SUT PROLENE 5 0 C 1 24 (SUTURE) ×12 IMPLANT
SUT PROLENE 6 0 BV (SUTURE) ×10 IMPLANT
SUT PROLENE 6 0 C 1 24 (SUTURE) ×2 IMPLANT
SUT PROLENE 7 0 BV 1 (SUTURE) ×7 IMPLANT
SUT SILK 2 0 (SUTURE) ×4
SUT SILK 2 0 FS (SUTURE) ×2 IMPLANT
SUT SILK 2 0 SH (SUTURE) ×5 IMPLANT
SUT SILK 2-0 18XBRD TIE 12 (SUTURE) IMPLANT
SUT SILK 3 0 (SUTURE) ×8
SUT SILK 3-0 18XBRD TIE 12 (SUTURE) IMPLANT
SUT VIC AB 2-0 CT1 27 (SUTURE) ×8
SUT VIC AB 2-0 CT1 TAPERPNT 27 (SUTURE) ×6 IMPLANT
SUT VIC AB 3-0 SH 27 (SUTURE) ×24
SUT VIC AB 3-0 SH 27X BRD (SUTURE) ×6 IMPLANT
TAPE UMBILICAL COTTON 1/8X30 (MISCELLANEOUS) ×1 IMPLANT
TOWEL OR 17X24 6PK STRL BLUE (TOWEL DISPOSABLE) ×8 IMPLANT
TOWEL OR 17X26 10 PK STRL BLUE (TOWEL DISPOSABLE) ×8 IMPLANT
TOWEL OR 17X26 4PK STRL BLUE (TOWEL DISPOSABLE) ×1 IMPLANT
TRAY FOLEY CATH 14FRSI W/METER (CATHETERS) ×4 IMPLANT
TUBING EXTENTION W/L.L. (IV SETS) ×4 IMPLANT
UNDERPAD 30X30 INCONTINENT (UNDERPADS AND DIAPERS) ×4 IMPLANT
WATER STERILE IRR 1000ML POUR (IV SOLUTION) ×4 IMPLANT
WIRE MICRO SET SILHO 5FR 7 (SHEATH) ×4 IMPLANT

## 2012-02-25 NOTE — Interval H&P Note (Signed)
[facility]  History and Physical Update  The patient was interviewed and re-examined.  The patient's previous History and Physical has been reviewed and is unchanged from my consult on: 02/18/12 except for: interval angiogram.  There is no change in the plan of care: R to L fem-fem BPG, possible L fem-PT vs L pop-PT bypass.  Leonides Sake, MD [facility] Office: [phone] Pager: [phone]  02/25/2012, 7:15 AM

## 2012-02-25 NOTE — Transfer of Care (Signed)
Immediate Anesthesia Transfer of Care Note  Patient: [name]  Procedure(s) Performed: Procedure(s) (LRB) with comments: BYPASS GRAFT FEMORAL-FEMORAL ARTERY (N/A) - RIGHT TO LEFT Using 8mm x 30 cm Hemashield Graft BYPASS GRAFT FEMORAL-TIBIAL ARTERY (Left) ENDARTERECTOMY FEMORAL (Bilateral) PATCH ANGIOPLASTY (Bilateral) - using  1 cm x 6 cm vascu guard patch angioplasty INTRA OPERATIVE ARTERIOGRAM (Left)  Patient Location: PACU  Anesthesia Type:General  Level of Consciousness: sedated  Airway & Oxygen Therapy: Patient Spontanous Breathing and Patient connected to face mask oxygen  Post-op Assessment: Report given to PACU RN and Post -op Vital signs reviewed and stable  Post vital signs: Reviewed and stable  Complications: No apparent anesthesia complications

## 2012-02-25 NOTE — Preoperative (Signed)
Beta Blockers   Reason not to administer Beta Blockers:Not Applicable 

## 2012-02-25 NOTE — Plan of Care (Signed)
Problem: Phase II Progression Outcomes Goal: Tolerating room air with O2 sats > or equal 90% Outcome: Progressing RA Goal: Graft patent without s/s of occlusion Outcome: Progressing Pulses dopplerable Goal: Pain controlled Outcome: Progressing Pt states pain is tolrable

## 2012-02-25 NOTE — Anesthesia Preprocedure Evaluation (Addendum)
Anesthesia Evaluation  Patient identified by MRN, date of birth, ID band Patient awake    Reviewed: Allergy & Precautions, H&P , NPO status , Patient's Chart, lab work & pertinent test results  Airway Mallampati: II TM Distance: >3 FB Neck ROM: Full    Dental  (+) Edentulous Upper and Edentulous Lower   Pulmonary shortness of breath, Current Smoker,  + rhonchi   Pulmonary exam normal       Cardiovascular hypertension, Pt. on medications and Pt. on home beta blockers + CAD, + Past MI, + Cardiac Stents, + CABG and + Peripheral Vascular Disease + dysrhythmias + pacemaker Rhythm:Regular Rate:Normal     Neuro/Psych CVA, Residual Symptoms    GI/Hepatic GERD-  Medicated,  Endo/Other  diabetes, Well Controlled, Type 2, Oral Hypoglycemic Agents  Renal/GU Renal InsufficiencyRenal disease     Musculoskeletal   Abdominal Normal abdominal exam  (+)   Peds  Hematology   Anesthesia Other Findings   Reproductive/Obstetrics                          Anesthesia Physical Anesthesia Plan  ASA: III  Anesthesia Plan: General   Post-op Pain Management:    Induction: Intravenous  Airway Management Planned: Oral ETT  Additional Equipment: Arterial line  Intra-op Plan:   Post-operative Plan: Extubation in OR  Informed Consent: I have reviewed the patients History and Physical, chart, labs and discussed the procedure including the risks, benefits and alternatives for the proposed anesthesia with the patient or authorized representative who has indicated his/her understanding and acceptance.   Dental advisory given  Plan Discussed with: CRNA, Surgeon and Anesthesiologist  Anesthesia Plan Comments:        Anesthesia Quick Evaluation

## 2012-02-25 NOTE — Op Note (Signed)
OPERATIVE NOTE   PROCEDURE: 1. Bilateral iliofemoral endarterectomies with bovine patch angioplasty 2. Right to left femorofemoral bypass 3. Composite conduit creation with external reinforced Propaten/Right non-reversed greater saphenous vein 4. Left common femoral artery to posterior tibial artery bypass with composite conduit  5. Right common femoral artery open cannulation 6. Left leg runoff  PRE-OPERATIVE DIAGNOSIS: Left foot critical limb ischemia  POST-OPERATIVE DIAGNOSIS: same as above   SURGEON: Leonides Sake, MD  ASSISTANT(S): Doreatha Massed, PAC   ANESTHESIA: general  ESTIMATED BLOOD LOSS: 425 cc  FINDING(S): 1. Bilateral severe calcification in both common femoral artery extending into bilateral superficial femoral arteries 2. Inadequate length of adequate right greater saphenous: distally < 2 mm 3. Dopplerable left posterior tibial artery and peroneal artery 4. No technical issue with widely patent right femoral to posterior tibial bypass 5. Patent posterior tibial artery, peroneal artery, and anterior tibial artery to dorsalis pedis on runoff  SPECIMEN(S):  none  INDICATIONS:   [name] is a 66 y.o. male who presents with left foot rest pain and left great toe ischemia.  Based on diagnostic angiography, I felt that his options were an inflow procedure: aortobifemoral bypass versus femorofemoral bypass.  Due to the risks, the patient elected to proceed with femorofemoral bypass.  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  The risk, benefits, and alternative for bypass operations were discussed with the patient.  The patient is aware the risks include but are not limited to: bleeding, infection, myocardial infarction, stroke, limb loss, nerve damage, need for additional procedures in the future, wound complications, and inability to complete the  bypass.  The patient is aware of these risks and agreed to proceed.  DESCRIPTION: After obtain full informed written consent, the patient was brought back to the operating room and placed supine upon the operating table.  The patient received IV antibiotics prior to induction.  After obtaining adequate anesthesia, the patient was prepped and draped in the standard fashion for:  bilateral bypass procedures.  I turned my attention to the right groin.  I made a longitudinal incision over the right common femoral artery.  Using electrocautery and blunt dissection, I was able to dissect out the right common femoral artery with some difficulty due to the previous cardiac catheterizations in this groin.  There was extensive scar tissue in this groin.  I then dissected out the distal external iliac artery, superficial femoral artery, profunda femoral artery, and circumflex arteries.  The common femoral artery and superficial femoral artery were both severely calcified, so an endarterectomy was going to be necessary.  I then turned my attention to the left groin.  I made a longitudinal incision over the left common femoral artery.  Using electrocautery and blunt dissection, I was able to dissect out the left common femoral artery.  I then dissected out the distal external iliac artery, superficial femoral artery, profunda femoral artery, and circumflex arteries.  The common femoral artery and superficial femoral artery were severely calcified, even worse than the right side.  Subsequently, I felt this side would also need an endarterectomy.  I then turned my attention to the left calf.  I externally rotated the left knee and placed a bump here.  I made an incision one finger-breath posterior to the tibia and dissected through the subcutaneous tissue and fascia with electrocautery until I entered the popliteal space.  I  then took down a hypertrophied soleus muscle, exposing the posterior tibial artery which was 2-2.5 mm.   This vessel was in the anatomic position for the posterior tibial artery but I also verified by dissecting out the proximal segment.  The popliteal artery was found to be a calcified cord.  At this point, I felt the posterior tibial artery to be soft enough to use as a distal target.  I turned my attention back to the right groin.  The patient was given 7000 units of Heparin intravenously, which was a therapeutic bolus.  In total, 47829 units of Heparin was administrated to achieve and maintain a therapeutic level of anticoagulation.  Approximately 2000 units of Heparin were given every hour to maintain anticoagulation.  The distal external iliac artery, superficial femoral artery, profunda artery and circumflex arteries were clamped or controlled with vessel loops.  I made an arteriotomy and extended it proximally and distally with some difficulty due to the disease extent here.  I started the endarterectomy in the common femoral artery and transected the plaque in the mid-segment.  I then carried the dissection into the distal external iliac artery, extracting most of the disease accessible from a groin approach.  I then carried the endarterectomy distally into the two profunda femoral arteries and superficial femoral artery.  The plaque appeared to feather out well in the profunda femoral arteries but the plaque in the superficial femoral artery appeared to continue for an extended length.  I transected the plaque a location where it appeared adherent to the wall.  There remains a flow channel in the superficial femoral artery despite the residual disease.  After extracting residual strands from the artery, I sewed a bovine pericardial patch that had been tailored for this arteriotomy with two running stitch of 6-0 Prolene.  Prior to completing this patch angioplasty, I backbled all arteries, which remained open.  Thrombin and gelfoam was placed into the surgical wound.  All clamps and vessel loops were  released.  I turned my attention to the left groin.  The distal external iliac artery, superficial femoral artery, profunda artery and circumflex arteries were clamped or controlled with vessel loops.  I made an arteriotomy and extended it proximally and distally with some difficulty due to the disease extent here.  I started the endarterectomy in the common femoral artery and transected the plaque in the mid-segment.  I then carried the dissection into the distal external iliac artery, extracting most of the disease accessible from a groin approach.  I then carried the endarterectomy distally into the two profunda femoral arteries and superficial femoral artery.  The plaque appeared to feather out well in the profunda femoral arteries but the plaque in the superficial femoral artery appeared to continue for an extended length.  I transected the plaque a location where it appeared adherent to the wall.  This superficial femoral artery was severely diseased with minimal lumen despite the preoperative angiogram findings.  After extracting residual strands from the artery, I sewed a bovine pericardial patch that had been tailored for this arteriotomy with two running stitch of 6-0 Prolene.  Prior to completing this patch angioplasty, I backbled all arteries, which remained open.  Immediately it became evident there was a backwall injury in the superficial femoral artery just distal to patch angioplasty.  I repaired this area with a 6-0 Prolene stitch.  Thrombin and gelfoam was placed into the surgical wound.  All clamps and vessel loops were released.  I then bluntly  created the begin of the femorofemoral tunnel in each groin and then dissected in a suprapubic fashion with a uterine packing clamp.  A 8 mm Dacron graft was obtain and pulled through the tunnel.  I spatulated the graft to facilitate an end-to-side anastomosis.  I reclamped the vessels in the right groin and made a incision in the bovine patch.  This  incision was extended proximally and distally to meet the geometry of the spatulated end of the graft.  This graft was sewn to the patched right common femoral artery with a running stitch of 5-0 Prolene.  Prior to completing this anastomosis, I backbled all arteries again.  The anastomosis was pulled up with a nerve hook and tied off.  I then clamped the graft near its anastomosis and released all clamps and vessel loops.  I then pulled the femorofemoral graft to appropriate tension and determine the location of the next anastomosis.  I spatulated the graft, adjusting the length in the process.   I reclamped the vessels in the left groin and made a incision in the bovine patch.  This incision was extended proximally and distally to meet the geometry of the spatulated end of the graft.  This graft was sewn to the patched left common femoral artery with a running stitch of 5-0 Prolene.  Prior to completing this anastomosis, I backbled all arteries again.  I also bled the femorofemoral graft.  No clot was noted.  The anastomosis was pulled up with a nerve hook and tied off.  I then clamped the graft near its anastomosis and released all clamps and vessel loops.  The clamp on the femorofemoral graft was released.  There was now a pulse in the left common femoral artery.  Some bleeding was still evident from the superficial femoral artery, so additional thrombin and gelfoam was packed next to the artery.  At this point, I turned my attention to the left posterior tibial artery.  There was no doppler signal in this artery, so I felt the femorotibial bypass was going to be necessary.  I turned my attention to the right leg.  Using the Sonosite, I identified the right greater saphenous vein.  I made an incision in the mid-thigh and dissected down to the vein sharply.  The caliber of the vein was marginal at best.  The vein also had evidence of some prior thrombosis.  I made an incision over the entire course of the vein  from just proximal to the right groin incision down to the calf.  At the level of the calf, the vein was <2 mm in diameter.  I clipped the vein at this level and transected the vein.  I then carried the harvest up into the right groin.  There was extensive scarring around the saphenofemoral junction.  I was able to dissect this out carefully.  I clamped the saphenofemoral junction and transected the greater saphenous vein, releasing it from the leg.  The vein was soaked in heparinized saline.  I oversewed the saphenofemoral junction with a double layer of 5-0 Prolene.  A wet lap pad placed over the entire harvest wound base to keep the wound wet.  I then turned my attention to the vein conduit.  I demonstrated some areas of thickening and possible prior thrombosis.  The distal vein would not distend more than 2-2.5 mm and the length was not adequate for use as a conduit.  I planned to use it a composite conduit with Propaten.  In case I need to jump down to the posterior tibial artery at the ankle level, I decided to prepare the entire vein conduit.  I tied off side branches with 4-0 Silk or with 7-0 Prolene stitches.  The vein conduit was water tight at this point but did not distend much even with hydrodistension.  The vein was soaked again in heparinized saline.   I turned by attention to the left calf exposure.  I bluntly dissected a tunnel between the femoral condyles.  I passed a long Gore tunneler from the calf exposure up to the right groin.  I tied a 6 mm Propaten graft to the inner cannula and delivered the graft through the tunnel, taking care to maintain orientation.  I removed the metal tunnel leaving the graft in place.  I turned my attention to the left groin.  I clamped the distal femorofemoral graft and then proximally in the groin.  I made a graftomy and extended it proximally and distally.  I spatulate the proximal end of the Propaten graft to facilitate an end-to-side anastomosis.  I sewed the  Propaten graft to the femorofemoral graft with a running stitch of 5-0 Prolene.  Prior to completing this, I backbled both ends of the femorofemoral graft.  No clots were noted.  I then clamped the Propaten graft near its anastomosis.  I  I then turned my attention to the calf exposure.  I pulled the Propaten graft to appropriate tension.  I cut the graft at the length for a femoral to popliteal bypass.  I pulled off some external rings and spatulate the end of graft.  The vein conduit was spatulated for an end-to-end anastomosis.  I sewed the vein and graft end-to-end with a running stitch of 6-0 Prolene in a non-reverse configuation.  I then lysed all valves in this vein conduit with a valvulotome.  There was pulsatile bleeding from the end of the vein at this point.  I clamped the end of the vein.    I then dissected out further the segment of exposure posterior tibial artery.  I clamped the artery proximally and distally in the mid-calf.  I made an arteriotomy and extended it proximally and distally with a Potts scissor.   I briefly released the clamp on each end and there remained blood flow, verifying this was the posterior tibial artery as it was the only patent tibial artery on preoperative angiogram.  I transected the vein at the appropriate length, taking into account extension of the knee.  I spatulated the end of this vein.  The vein was sewn to the posterior tibial artery with a 7-0 Prolene.  Prior to completing this anastomosis, I backbled both ends of the posterior tibial artery and allowed the bypass graft to bleed.  No clot was noted.  I completed the anastomosis in the usual fashion.  I released all clamps.  Immediately there was a pulse in the posterior tibial artery.  I was able to doppler a left posterior tibial artery and peroneal artery, both which were not present previously intraoperatively.  I then turned my attention to right groin.  I cannulated the right common femoral artery  proximal to the femorofemoral graft with a 18 gauge needle.  I placed a J-wire into the external iliac artery and loaded a 5 Fr sheath over the wire.  Unfortunately the anterior wall of the artery was quite attenuated and there was significant bleeding from the anterior wall.  I double looped the  common femoral artery with a vessel loop to try to lessen contrast extravasation, but this maneuver unfortunately limited the valve of the right groin angiogram as the distal external iliac artery blood flow was restricted.  I clamped the common femoral artery and then repaired the arteriotomy caused by the sheath.  I then cannulated the femorofemoral graft distal to its anastomosis in the right groin.  I placed a J-wire into the external iliac artery and loaded a 5 Fr sheath over the wire.  Using this access, I was able to complete the left leg angiogram.  The left profunda arteries appears to be patent but the left superficial femoral artery appears occluded.  The bypass is widely open with good blood flow into the posterior tibial artery.  The posterior tibial artery also fills retrograde and fills the peroneal artery.  On the lateral foot projection, peroneal, posterior tibial, and anterior tibial arteries appear to robustly fill the left foot.  A dorsalis pedis enters the left foot also.  At this point, I clamped the femorofemoral graft, removed the sheath, and repaired the hole caused by the sheath with a 6-0 Prolene.  I unclamped the graft.  The patient was given 50 mg of Protamine to reverse the substantial anticoagulation.  I washed out all wounds and placed thrombin and gelfoam.  Bleeding in the vein harvest wound was controlled with electrocautery.  This wound was repaired with a running stitch of 3-0 Vicryl in the subcutaneous tissue.  The skin was reapproximated with staples.  The right groin was repaired with a double layer of 2-0 Vicryl, a double layer of 3-0 Vicryl and a running subcuticular of 4-0  Monocryl in the skin.  I removed all packing from the left groin and washout out this wound.  No further bleeding from the superficial femoral artery was noted, possibly due to its occlusion evident on angiogram.  The left groin was repaired with a double layer of 2-0 Vicryl, a double layer of 3-0 Vicryl and a running subcuticular of 4-0 Monocryl in the skin.  I turned my attention to the left calf.  I repaired the soleus muscle edges to cover the composite bypass graft.  The subcutaneous tissue was reapproximated with a running 3-0 Vicryl.  The skin was then closed with a running subcuticular of 4-0 Monocryl.  The calf and both groin incisions were reinforced with Dermabond.  A sterile Coverall dressing was applied to the right leg vein harvest incision.  COMPLICATIONS: none  CONDITION: stable  Leonides Sake, MD [facility] Office: [phone] Pager: [phone]  02/25/2012, 5:00 PM

## 2012-02-25 NOTE — Anesthesia Postprocedure Evaluation (Signed)
Anesthesia Post Note  Patient: [name]  Procedure(s) Performed: Procedure(s) (LRB): BYPASS GRAFT FEMORAL-FEMORAL ARTERY (N/A) BYPASS GRAFT FEMORAL-TIBIAL ARTERY (Left) ENDARTERECTOMY FEMORAL (Bilateral) PATCH ANGIOPLASTY (Bilateral) INTRA OPERATIVE ARTERIOGRAM (Left)  Anesthesia type: general  Patient location: PACU  Post pain: Pain level controlled  Post assessment: Patient's Cardiovascular Status Stable  Last Vitals:  Filed Vitals:   02/25/12 1800  BP: 172/66  Pulse: 59  Temp:   Resp: 12    Post vital signs: Reviewed and stable  Level of consciousness: sedated  Complications: No apparent anesthesia complications

## 2012-02-25 NOTE — H&P (View-Only) (Signed)
[facility] [place]  Referred by:  Dr. Knapp ([facility] ED)  Reason for referral: Left foot ischemia  History of Present Illness  [name] is a 65 y.o. (11/30/1945) male who presents with chief complaint: left leg pain.  Onset of symptom occurred years ago, though claims foot pain only started few weeks ago and left leg pain today.  His cardiologist documents claudication not well managed medically due to continued smoking.  Pain is described as "hurts", severity 5-10/10, and associated with rest and movement.  Patient has attempted to treat this pain with rest.  The patient has rest pain symptoms also and no leg wounds/ulcers.  Atherosclerotic risk factors include: hyperlipidemia, DM, HTN, and current active smoker.  Past Medical History  Diagnosis Date  . Cerebellar stroke, acute   . IHD (ischemic heart disease)   . Hiccoughs   . Diabetes mellitus   . Hyperlipidemia   . Vitamin B12 deficiency   . DIABETES MELLITUS, TYPE II 09/20/2008    Qualifier: Diagnosis of  By: McLean, Monica    . HYPERTENSION 09/20/2008    Qualifier: Diagnosis of  By: McLean, Monica    . CAD 09/20/2008    Qualifier: Diagnosis of  By: McLean, Monica    . BRADYCARDIA 09/20/2008    Qualifier: Diagnosis of  By: McLean, Monica    . 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 TIA 09/20/2008    Qualifier: Diagnosis of  By: McLean, Monica    . PSORIASIS 09/20/2008    Qualifier: Diagnosis of  By: McLean, Monica    . Cardiac pacemaker in situ 10/08/2008    Qualifier: Diagnosis of  By: Taylor, MD, FACC, Gregg William   . Benign hypertensive heart disease without heart failure 08/03/2010  . Hx of CABG 08/03/2010  . Intractable hiccoughs 08/03/2010  . Tobacco abuse 08/03/2010  . Vascular claudication 11/15/2010  . Dyslipidemia 09/05/2011  . GERD (gastroesophageal reflux disease) 11/06/2011  . Nephrolithiasis 11/06/2011  . Carotid stenosis 11/06/2011    Bilat  60-80% 2010    Past Surgical History  Procedure Date  . Coronary artery bypass graft   . Coronary angioplasty 09/18/2006    WELL PRESERVED LEFT VENTRICULAR SYSTOLIC FUNCTION. THERE APPEARS TBE SOME HYPOKINESIS OF THE POSTERIOR WALL. EF 60%    History   Social History  . Marital Status: Single    Spouse Name: N/A    Number of Children: N/A  . Years of Education: N/A   Occupational History  . Not on file.   Social History Main Topics  . Smoking status: Current Every Day Smoker -- 1.0 packs/day    Types: Cigarettes  . Smokeless tobacco: Former User  . Alcohol Use: No  . Drug Use: No  . Sexually Active: Not on file   Other Topics Concern  . Not on file   Social History Narrative  . No narrative on file    Family History  Problem Relation Age of Onset  . COPD Mother   . Coronary artery disease Father     No current facility-administered medications on file prior to encounter.   Current Outpatient Prescriptions on File Prior to Encounter  Medication Sig Dispense Refill  . aspirin 81 MG tablet Take 81 mg by mouth daily.        . atorvastatin (LIPITOR) 80 MG tablet Take 1 tablet (80 mg total) by mouth daily.  90 tablet  3  .   baclofen (LIORESAL) 10 MG tablet Take 10 mg by mouth Daily.      . dexlansoprazole (DEXILANT) 60 MG capsule Take 1 capsule (60 mg total) by mouth daily.  90 capsule  3  . ezetimibe (ZETIA) 10 MG tablet Take 1 tablet (10 mg total) by mouth daily.  30 tablet  11  . glimepiride (AMARYL) 4 MG tablet Take 1 tablet (4 mg total) by mouth daily before breakfast.  30 tablet  11  . lisinopril-hydrochlorothiazide (PRINZIDE,ZESTORETIC) 20-12.5 MG per tablet Take 1 tablet by mouth daily.  30 tablet  11  . metoprolol succinate (TOPROL-XL) 100 MG 24 hr tablet Take 100 mg by mouth daily. Take with or immediately following a meal.      . nitroGLYCERIN (NITROSTAT) 0.4 MG SL tablet Place 0.4 mg under the tongue every 5 (five) minutes as needed. For chest pain      .  pantoprazole (PROTONIX) 40 MG tablet Take 40 mg by mouth daily.      . ranitidine (ZANTAC) 150 MG tablet TAKE 1 TABLET BY MOUTH TWICE A DAY  60 tablet  7    Allergies  Allergen Reactions  . Imdur (Isosorbide Mononitrate)     HEADACHES   . Metformin And Related     ABDOMINAL CRAMPS    REVIEW OF SYSTEMS:  (Positives checked otherwise negative)  CARDIOVASCULAR:  [ ] chest pain, [ ] chest pressure, [ ] palpitations, [ ] shortness of breath when laying flat, [ ] shortness of breath with exertion,   [x] pain in feet when walking, [x] pain in feet when laying flat, [ ] history of blood clot in veins (DVT), [ ] history of phlebitis, [ ] swelling in legs, [ ] varicose veins  PULMONARY:  [ ] productive cough, [ ] asthma, [ ] wheezing  NEUROLOGIC:  [ ] weakness in arms or legs, [ ] numbness in arms or legs, [ ] difficulty speaking or slurred speech, [ ] temporary loss of vision in one eye, [x] dizziness, [x] hiccups  HEMATOLOGIC:  [ ] bleeding problems, [ ] problems with blood clotting too easily  MUSCULOSKEL:  [ ] joint pain, [ ] joint swelling  GASTROINTEST:  [ ]  Vomiting blood, [ ]  Blood in stool     GENITOURINARY:  [ ]  Burning with urination, [ ]  Blood in urine  PSYCHIATRIC:  [ ] history of major depression  INTEGUMENTARY:  [ ] rashes, [ ] ulcers  CONSTITUTIONAL:  [ ] fever, [ ] chills   Physical Examination  Filed Vitals:   02/18/12 1413 02/18/12 2037  BP: 142/96 135/81  Pulse: 74 70  Temp: 97.9 F (36.6 C)   TempSrc: Oral   Resp: 18 14  Height: 5' 6" (1.676 m)   Weight: 178 lb (80.74 kg)   SpO2: 100% 98%   Body mass index is 28.73 kg/(m^2).  General: A&O x 3, WDWN  Head: [place]/AT  Ear/Nose/Throat: Hearing grossly intact, nares w/o erythema or drainage, oropharynx w/o Erythema/Exudate   Eyes: PERRLA, EOMI  Neck: Supple, no nuchal rigidity, no palpable LAD  Pulmonary: Sym exp, good air movt, CTAB, no rales, rhonchi, & wheezing  Cardiac: RRR, Nl S1, S2, no  Murmurs, rubs or gallops  Vascular: Vessel Right Left  Radial Palpable Palpable  Ulnar Palpable Palpable  Brachial Palpable Palpable  Carotid Palpable, without bruit Palpable, without bruit  Aorta Not palpable N/A  Femoral Palpable Palpable  Popliteal Not palpable Not palpable  PT Not Palpable   Not Palpable  DP Not Palpable Not Palpable   Gastrointestinal: soft, NTND, -G/R, - HSM, - masses, - CVAT B  Musculoskeletal: M/S 5/5 throughout , Extremities without ischemic changes except  L 5th toe cyanotic  Neurologic: CN 2-12 grossly intact , Pain and light touch intact in extremities , Motor exam as listed above  Psychiatric: Judgment intact, Mood & affect appropriate for pt's clinical situation  Dermatologic: See M/S exam for extremity exam, no rashes otherwise noted  Lymph : No Cervical, Axillary, or Inguinal lymphadenopathy   Laboratory: CBC:    Component Value Date/Time   WBC 8.8 02/18/2012 1420   RBC 5.05 02/18/2012 1420   HGB 15.3 02/18/2012 1743   HCT 45.0 02/18/2012 1743   PLT 247 02/18/2012 1420   MCV 86.3 02/18/2012 1420   MCH 29.9 02/18/2012 1420   MCHC 34.6 02/18/2012 1420   RDW 14.8 02/18/2012 1420   LYMPHSABS 1.0 02/18/2012 1420   MONOABS 0.9 02/18/2012 1420   EOSABS 0.1 02/18/2012 1420   BASOSABS 0.2* 02/18/2012 1420    BMP:    Component Value Date/Time   NA 137 02/18/2012 1743   K 3.6 02/18/2012 1743   CL 98 02/18/2012 1743   CO2 28 02/07/2012 1002   GLUCOSE 182* 02/18/2012 1743   BUN 8 02/18/2012 1743   CREATININE 1.30 02/18/2012 1743   CALCIUM 9.4 02/07/2012 1002   GFRNONAA 58* 12/17/2008 1130   GFRAA  Value: >60        The eGFR has been calculated using the MDRD equation. This calculation has not been validated in all clinical situations. eGFR's persistently <60 mL/min signify possible Chronic Kidney Disease. 12/17/2008 1130    Coagulation: Lab Results  Component Value Date   INR 1.07 02/18/2012   INR 1.2 05/21/2008   INR 1.0 05/17/2008    No results found for this basename: PTT    Non-Invasive Vascular Imaging  LLE Venous Duplex (Date: 02/18/12)  Left lower extremity venous duplex completed.   Left lower extremity is negative for deep vein thrombosis. No evidence of left Baker's cyst.   Incidental finding: Left lower extremity arteries have evidence of significant plaque morphology, with common femoral and femoral arteries demonstrating monophasic waveforms, proximal posterior tibial artery demonstrates dampened monophasic waveforms.   Medical Decision Making  [name] is a 65 y.o. male who presents with: LLE critical limb ischemia in setting of chronic PAD, significant CAD, DM, HTN, Hyperlipidemia, CVA.   Patient has intact motor with evidence of dopplerable tibial arterial flow, so he does NOT have a threatened limb, so immediate intervention overnight is not necessary.  Admit to Hospitalist service for medical optimization.  Likely will require Cardiology preop evaluation also.  Keep patient on Heparin drip for now to limit propagation of thrombus, though I suspect this patient has chronic atherosclerosis in both legs so any thrombus is likely in-situ.  I discussed with the patient the natural history of critical limb ischemia: 25% require amputation in one year, 50% are able to maintain their limbs in one year, and 25-30% die in one year due to comorbidities.  Given the limb threatening status of this patient, I recommend an aggressive work up including proceeding with an: Aortogram, Bilateral runoff and intervention. I discussed with the patient the nature of angiographic procedures, especially the limited patencies of any endovascular intervention. The patient is aware of that the risks of an angiographic procedure include but are not limited to: bleeding, infection, access site complications, embolization, rupture of treated   vessel, dissection, possible need for emergent surgical intervention, and  possible need for surgical procedures to treat the patient's pathology. The patient is aware of the risks and agrees to proceed.  The procedure is scheduled for: Tues/Wed. Keep patient NPO overnight to facilitate the angiogram. Baseline ABI and BLE Vein Mapping should be obtained.  I discussed in depth with the patient the nature of atherosclerosis, and emphasized the importance of maximal medical management including strict control of blood pressure, blood glucose, and lipid levels, antiplatelet agents, obtaining regular exercise, and cessation of smoking.  The patient is aware that without maximal medical management the underlying atherosclerotic disease process will progress, limiting the benefit of any interventions.  Thank you for allowing us to participate in this patient's care.  Brian Chen, MD [facility] [place] Office: [phone] Pager: [phone]  02/18/2012, 10:20 PM     

## 2012-02-26 ENCOUNTER — Encounter (HOSPITAL_COMMUNITY): Payer: Self-pay | Admitting: Vascular Surgery

## 2012-02-26 LAB — CBC
HCT: 31.6 % — ABNORMAL LOW (ref 39.0–52.0)
MCV: 85.2 fL (ref 78.0–100.0)
RBC: 3.71 MIL/uL — ABNORMAL LOW (ref 4.22–5.81)
RDW: 14.5 % (ref 11.5–15.5)
WBC: 13.3 10*3/uL — ABNORMAL HIGH (ref 4.0–10.5)

## 2012-02-26 LAB — HEMOGLOBIN A1C
Hgb A1c MFr Bld: 7.3 % — ABNORMAL HIGH (ref <5.7)
Mean Plasma Glucose: 163 mg/dL — ABNORMAL HIGH (ref <117)

## 2012-02-26 LAB — BASIC METABOLIC PANEL
BUN: 10 mg/dL (ref 6–23)
CO2: 22 mEq/L (ref 19–32)
Chloride: 99 mEq/L (ref 96–112)
Creatinine, Ser: 1.46 mg/dL — ABNORMAL HIGH (ref 0.50–1.35)
GFR calc Af Amer: 57 mL/min — ABNORMAL LOW (ref >90)
Potassium: 3.4 mEq/L — ABNORMAL LOW (ref 3.5–5.1)

## 2012-02-26 LAB — GLUCOSE, CAPILLARY
Glucose-Capillary: 153 mg/dL — ABNORMAL HIGH (ref 70–99)
Glucose-Capillary: 99 mg/dL (ref 70–99)
Glucose-Capillary: 100 mg/dL — ABNORMAL HIGH (ref 70–99)

## 2012-02-26 MED ORDER — INSULIN ASPART 100 UNIT/ML ~~LOC~~ SOLN
0.0000 [IU] | Freq: Three times a day (TID) | SUBCUTANEOUS | Status: DC
  Administered 2012-02-27 – 2012-02-28 (×3): 2 [IU] via SUBCUTANEOUS
  Administered 2012-02-29: 3 [IU] via SUBCUTANEOUS

## 2012-02-26 MED ORDER — POTASSIUM CHLORIDE CRYS ER 20 MEQ PO TBCR
20.0000 meq | EXTENDED_RELEASE_TABLET | Freq: Two times a day (BID) | ORAL | Status: AC
  Administered 2012-02-26 (×2): 20 meq via ORAL
  Filled 2012-02-26 (×2): qty 1

## 2012-02-26 MED ORDER — CIPROFLOXACIN HCL 500 MG PO TABS
500.0000 mg | ORAL_TABLET | Freq: Two times a day (BID) | ORAL | Status: DC
  Administered 2012-02-26 – 2012-02-29 (×7): 500 mg via ORAL
  Filled 2012-02-26 (×9): qty 1

## 2012-02-26 NOTE — Evaluation (Signed)
Occupational Therapy Evaluation Patient Details Name: [name] MRN: [id number] DOB: 02-Jun-1945 Today's Date: 02/26/2012 Time: 4098-1191 OT Time Calculation (min): 29 min  OT Assessment / Plan / Recommendation Clinical Impression  Pt is a 66 yr old male admitted for LLE bypass surgery.  Now presents with slight decreased independence with basic selfcare tasks.  Will benefit from acute care OT to help increase ADL performance to a supervision/modified independent level for discharge home.  Feel he will need 24 hour supervision initially at home secondary to increased fall risk.  No follow-up OT recommended if discharge home.    OT Assessment  Patient needs continued OT Services    Follow Up Recommendations  No OT follow up    Barriers to Discharge None    Equipment Recommendations  3 in 1 bedside comode       Frequency  Min 2X/week    Precautions / Restrictions Precautions Precautions: Fall Restrictions Weight Bearing Restrictions: No   Pertinent Vitals/Pain HR around 77 BPM with therapy, O2 sats 96-100% on room air    ADL  Eating/Feeding: Simulated;Independent Where Assessed - Eating/Feeding: Chair Grooming: Simulated;Minimal assistance Where Assessed - Grooming: Supported standing Upper Body Bathing: Simulated;Set up Where Assessed - Upper Body Bathing: Unsupported sitting Lower Body Bathing: Simulated;Minimal assistance Where Assessed - Lower Body Bathing: Supported sit to stand Upper Body Dressing: Simulated;Set up Where Assessed - Upper Body Dressing: Unsupported sitting Lower Body Dressing: Simulated;Minimal assistance Where Assessed - Lower Body Dressing: Supported sit to stand Toilet Transfer: Performed;Minimal assistance Toilet Transfer Method: Other (comment) (ambulate with RW) Acupuncturist: Comfort height toilet Toileting - Clothing Manipulation and Hygiene: Simulated;Minimal assistance Where Assessed - Engineer, mining and  Hygiene: Sit to stand from 3-in-1 or toilet Tub/Shower Transfer Method: Not assessed Equipment Used: Rolling walker Transfers/Ambulation Related to ADLs: Pt overall min assist for mobility using the RW. ADL Comments: Pt overall min assist for simulated selfcare tasks secondary to decreased balance.  Demonstrated one posterior LOB in standing when attempting to turn with the walker needing min assist to self correct.    OT Diagnosis: Generalized weakness  OT Problem List: Decreased strength;Impaired balance (sitting and/or standing);Decreased knowledge of use of DME or AE;Decreased safety awareness OT Treatment Interventions: Self-care/ADL training;Therapeutic activities;DME and/or AE instruction;Balance training;Patient/family education   OT Goals Acute Rehab OT Goals OT Goal Formulation: With patient Time For Goal Achievement: 03/11/12 ADL Goals Pt Will Perform Grooming: with supervision;Standing at sink;Other (comment) (3 tasks) ADL Goal: Grooming - Progress: Goal set today Pt Will Perform Lower Body Bathing: with supervision;Sit to stand from chair ADL Goal: Lower Body Bathing - Progress: Goal set today Pt Will Perform Lower Body Dressing: with supervision;Sit to stand from bed ADL Goal: Lower Body Dressing - Progress: Goal set today Pt Will Transfer to Toilet: with modified independence;with DME;3-in-1 ADL Goal: Toilet Transfer - Progress: Goal set today Pt Will Perform Tub/Shower Transfer: with supervision;with DME;Ambulation;Other (comment) (walk-in shower) ADL Goal: Tub/Shower Transfer - Progress: Goal set today  Visit Information  Last OT Received On: 02/26/12 Assistance Needed: +1    Subjective Data  Subjective: I just sit around at home. Patient Stated Goal: Did not state but agreeable to participate with therapy.   Prior Functioning     Home Living Lives With: Daughter Available Help at Discharge: Family;Available PRN/intermittently (Per pt, daughter works  full-time.) Type of Home: Mobile home Home Access: Level entry Home Layout: One level Bathroom Shower/Tub: Health visitor: Standard Bathroom Accessibility: No Home  Adaptive Equipment: Straight cane Prior Function Level of Independence: Independent Able to Take Stairs?: Yes Driving: No Vocation: Retired Musician: No difficulties Dominant Hand: Right         Vision/Perception Vision - Assessment Eye Alignment: Within Functional Limits Vision Assessment: Vision not tested Perception Perception: Within Functional Limits Praxis Praxis: Intact   Cognition  Overall Cognitive Status: Impaired Area of Impairment: Memory Arousal/Alertness: Awake/alert Orientation Level: Time;Disoriented to Behavior During Session: [facility] for tasks performed    Extremity/Trunk Assessment Right Upper Extremity Assessment RUE ROM/Strength/Tone: Within functional levels RUE Sensation: WFL - Light Touch RUE Coordination: WFL - gross/fine motor Left Upper Extremity Assessment LUE ROM/Strength/Tone: Within functional levels LUE Sensation: WFL - Light Touch LUE Coordination: WFL - gross/fine motor     Mobility Transfers Transfers: Sit to Stand Sit to Stand: With upper extremity assist;4: Min assist;With armrests Stand to Sit: 4: Min assist;Without upper extremity assist;To chair/3-in-1;With armrests Details for Transfer Assistance: Pt flopped down in bedside chair secondary to not reaching back for arm rests of the chair.           Balance Balance Balance Assessed: Yes Dynamic Standing Balance Dynamic Standing - Balance Support: Right upper extremity supported;Left upper extremity supported Dynamic Standing - Level of Assistance: 4: Min assist   End of Session OT - End of Session Equipment Utilized During Treatment: Gait belt Activity Tolerance: Patient tolerated treatment well Patient left: in chair;with call bell/phone within reach Nurse Communication:  Mobility status     Sherian Valenza OTR/L Pager number [phone] 02/26/2012, 2:40 PM

## 2012-02-26 NOTE — Progress Notes (Addendum)
[facility] Progress Note  02/26/2012 7:23 AM POD 1  Subjective:  No complaints  Tm 102.1  VSS 100%RA  Filed Vitals:   02/26/12 0300  BP: 139/51  Pulse: 81  Temp: 102.1 F (38.9 C)  Resp: 20    Physical Exam: Incisions:  Bilateral groin incisions are c/d/i.  Bandage over vein harvest site is clean and in tact. Extremities:  left foot is warm with palpable DP; left 5th toe continues with ischemic changes.  CBC    Component Value Date/Time   WBC 13.3* 02/26/2012 0450   RBC 3.71* 02/26/2012 0450   HGB 11.0* 02/26/2012 0450   HCT 31.6* 02/26/2012 0450   PLT 208 02/26/2012 0450   MCV 85.2 02/26/2012 0450   MCH 29.6 02/26/2012 0450   MCHC 34.8 02/26/2012 0450   RDW 14.5 02/26/2012 0450   LYMPHSABS 1.0 02/18/2012 1420   MONOABS 0.9 02/18/2012 1420   EOSABS 0.1 02/18/2012 1420   BASOSABS 0.2* 02/18/2012 1420    BMET    Component Value Date/Time   NA 132* 02/26/2012 0450   K 3.4* 02/26/2012 0450   CL 99 02/26/2012 0450   CO2 22 02/26/2012 0450   GLUCOSE 130* 02/26/2012 0450   BUN 10 02/26/2012 0450   CREATININE 1.46* 02/26/2012 0450   CALCIUM 8.5 02/26/2012 0450   GFRNONAA 49* 02/26/2012 0450   GFRAA 57* 02/26/2012 0450    INR    Component Value Date/Time   INR 1.07 02/18/2012 1924     Intake/Output Summary (Last 24 hours) at 02/26/12 0723 Last data filed at 02/26/12 0600  Gross per 24 hour  Intake   6955 ml  Output   2000 ml  Net   4955 ml     Assessment/Plan:  66 y.o. male is s/p  1. Bilateral iliofemoral endarterectomies with bovine patch angioplasty 2. Right to left femorofemoral bypass 3. Composite conduit creation with external reinforced Propaten/Right non-reversed greater saphenous vein 4. Left common femoral artery to posterior tibial artery bypass with composite conduit  5. Right common femoral artery open cannulation 6. Left leg runoff 7.   POD 1  -pt doing well this am with exception of fever of 102.1 this  morning with mild leukocytosis-probable acute phase from surgery, but will check UA this am.  Pt has received zinacef x 2 doses post op.  ? Continue prophylactic ABx given pt has synthetic graft.  Will d/w Dr. Imogene Burn -bypass graft is patent with palpable pulse is left foot. -BUN/Cr is stable after intraoperative arteriogram with good UOP -hypokalemia-will supplement -ck labs in am -d/c IVF -continue mobilization  Doreatha Massed, PA-C [facility] [phone] 02/26/2012 7:23 AM   Addendum  I have independently interviewed and examined the patient, and I agree with the physician assistant's findings.  Empiric abx for ?UTI.  I favor starting abx while awaiting UA and BC.  Lungs sound clear so doubt PNA.  Too much prosthetic material to assume no bacteremia.  H/H acceptable.  Ok to transfer to 2000.  Leonides Sake, MD [facility] [place] Office: [phone] Pager: [phone]  02/26/2012, 7:55 AM

## 2012-02-26 NOTE — Progress Notes (Signed)
Patient being transferred to 2000 per MD order. Report called to [place], Charity fundraiser. Patient alert and oriented. Will travel in wheel chair.

## 2012-02-26 NOTE — Progress Notes (Signed)
Utilization review completed.  

## 2012-02-26 NOTE — Care Management Note (Addendum)
    Page 1 of 2   02/29/2012     2:12:44 PM   CARE MANAGEMENT NOTE 02/29/2012  Patient:  [name], [name]   Account Number:  [id number]  Date Initiated:  02/26/2012  Documentation initiated by:  Donn Pierini  Subjective/Objective Assessment:   Pt admitted s/p fem pop bypass surgery     Action/Plan:   PTA pt lived at home with children-PT eval-  NCM to follow for pt progression and d/c needs HH vs SNF   Anticipated DC Date:  02/29/2012   Anticipated DC Plan:  HOME W HOME HEALTH SERVICES      DC Planning Services  CM consult      [facility] Choice  HOME HEALTH   Choice offered to / List presented to:  C-1 Patient        HH arranged  HH-2 PT  HH-3 OT      [facility] agency  [facility]   Status of service:  Completed, signed off Medicare Important Message given?   (If response is "NO", the following Medicare IM given date fields will be blank) Date Medicare IM given:   Date Additional Medicare IM given:    Discharge Disposition:  HOME W HOME HEALTH SERVICES  Per UR Regulation:  Reviewed for med. necessity/level of care/duration of stay  If discussed at Long Length of Stay Meetings, dates discussed:    Comments:  02/29/12 Yue Glasheen,RN,BSN [phone] PT FOR LIKELY DC HOME TODAY.  HE STATES HE HAS PLENTY OF HELP AT HOME, LIVES WITH DAUGHTER, GIRLFRIEND.  PT RECEPTIVE TO HOME HEALTH AT DC, CHOOSES [facility] FOR [facility] SERVICES.  REFERRAL TO [facility] FOR HH NEEDS.  START OF CARE 24-48H POST DC DATE.

## 2012-02-26 NOTE — Evaluation (Signed)
Physical Therapy Evaluation Patient Details Name: [name] MRN: [id number] DOB: Jun 03, 1945 Today's Date: 02/26/2012 Time: 0727-0752 PT Time Calculation (min): 25 min  PT Assessment / Plan / Recommendation Clinical Impression  Pt is a 66 y/o male admitted s/p right to left BPG along with the below PT problem list. Pt would benefit from acute PT to maximize independence and facilitate d/c to STSNF (vs home with HHPT with 24 hour assist if available).    PT Assessment  Patient needs continued PT services    Follow Up Recommendations  SNF (vs HHPT if 24 hour assist available.)    Does the patient have the potential to tolerate intense rehabilitation      Barriers to Discharge Decreased caregiver support Per pt, daughter works full-time.    Equipment Recommendations  Rolling walker with 5" wheels    Recommendations for Other Services     Frequency Min 3X/week    Precautions / Restrictions Precautions Precautions: Fall Restrictions Weight Bearing Restrictions: No   Pertinent Vitals/Pain 6/10 in left LE with gait. Pt repositioned.      Mobility  Bed Mobility Bed Mobility: Not assessed Transfers Transfers: Sit to Stand;Stand to Sit Sit to Stand: 4: Min assist;With upper extremity assist;From chair/3-in-1 Stand to Sit: 4: Min assist;With upper extremity assist;To chair/3-in-1 Details for Transfer Assistance: Assist for balance and to translate trunk anterior/slow descent due to pain. Cues for safest hand placement. Ambulation/Gait Ambulation/Gait Assistance: 3: Mod assist Ambulation Distance (Feet): 30 Feet Assistive device: Rolling walker Ambulation/Gait Assistance Details: Assist for balance and to off weight left LE with difficulty advancing right LE due to pain with left stance. Assist to also advance RW as well as max cues for sequence, tall posture, and safety inside RW. Gait Pattern: Step-to pattern;Decreased stride length;Trunk flexed;Narrow base of  support;Antalgic Gait velocity: Very slow. Stairs: No Wheelchair Mobility Wheelchair Mobility: No    Shoulder Instructions     Exercises     PT Diagnosis: Difficulty walking;Acute pain  PT Problem List: Decreased strength;Decreased activity tolerance;Decreased balance;Decreased mobility;Decreased knowledge of use of DME;Pain PT Treatment Interventions: DME instruction;Gait training;Functional mobility training;Therapeutic activities;Balance training;Patient/family education;Cognitive remediation   PT Goals Acute Rehab PT Goals PT Goal Formulation: With patient Time For Goal Achievement: 03/11/12 Potential to Achieve Goals: Good Pt will go Supine/Side to Sit: with modified independence PT Goal: Supine/Side to Sit - Progress: Goal set today Pt will go Sit to Supine/Side: with modified independence PT Goal: Sit to Supine/Side - Progress: Goal set today Pt will go Sit to Stand: with modified independence PT Goal: Sit to Stand - Progress: Goal set today Pt will go Stand to Sit: with modified independence PT Goal: Stand to Sit - Progress: Goal set today Pt will Ambulate: 51 - 150 feet;with modified independence;with least restrictive assistive device PT Goal: Ambulate - Progress: Goal set today  Visit Information  Last PT Received On: 02/26/12 Assistance Needed: +1    Subjective Data  Subjective: "I just can't remember some of these answers." Patient Stated Goal: Decrease pain.   Prior Functioning  Home Living Lives With: Daughter Available Help at Discharge: Family;Available PRN/intermittently (Per pt, daughter works full-time.) Type of Home: Mobile home Home Access: Level entry Home Layout: One level Bathroom Accessibility: No Home Adaptive Equipment: None Prior Function Level of Independence: Independent Able to Take Stairs?: Yes Driving: No Vocation: Retired Musician: No difficulties    Cognition  Overall Cognitive Status: Impaired Area of  Impairment: Memory;Safety/judgement;Problem solving Arousal/Alertness: Awake/alert Orientation Level: Disoriented  to;Time;Situation Behavior During Session: [facility] for tasks performed Memory Deficits: Difficulty recalling PLOF this am. Safety/Judgement: Impulsive;Decreased awareness of need for assistance Problem Solving: Max cues for problem solving throughout session.    Extremity/Trunk Assessment Right Upper Extremity Assessment RUE ROM/Strength/Tone: Within functional levels RUE Sensation: WFL - Light Touch RUE Coordination: WFL - gross motor Left Upper Extremity Assessment LUE ROM/Strength/Tone: Within functional levels LUE Sensation: WFL - Light Touch LUE Coordination: WFL - gross motor Right Lower Extremity Assessment RLE ROM/Strength/Tone: Deficits;Due to pain RLE ROM/Strength/Tone Deficits: 3/5 throughout due to pain. RLE Coordination: WFL - gross motor Left Lower Extremity Assessment LLE ROM/Strength/Tone: Deficits;Due to pain LLE ROM/Strength/Tone Deficits: 3/5 throughout due to pain. LLE Coordination: WFL - gross motor Trunk Assessment Trunk Assessment: Normal   Balance Balance Balance Assessed: No  End of Session PT - End of Session Equipment Utilized During Treatment: Gait belt Activity Tolerance: Patient limited by pain Patient left: in chair;with call bell/phone within reach Nurse Communication: Mobility status  GP     Cephus Shelling 02/26/2012, 8:21 AM  02/26/2012 Cephus Shelling, PT, DPT [phone]

## 2012-02-27 LAB — BASIC METABOLIC PANEL
Chloride: 102 mEq/L (ref 96–112)
Creatinine, Ser: 1.71 mg/dL — ABNORMAL HIGH (ref 0.50–1.35)
GFR calc Af Amer: 47 mL/min — ABNORMAL LOW (ref >90)
GFR calc non Af Amer: 41 mL/min — ABNORMAL LOW (ref >90)
Potassium: 3.5 mEq/L (ref 3.5–5.1)

## 2012-02-27 LAB — CBC
MCHC: 35.5 g/dL (ref 30.0–36.0)
MCV: 84.2 fL (ref 78.0–100.0)
Platelets: 181 10*3/uL (ref 150–400)
RDW: 14.5 % (ref 11.5–15.5)
WBC: 12.9 10*3/uL — ABNORMAL HIGH (ref 4.0–10.5)

## 2012-02-27 LAB — GLUCOSE, CAPILLARY

## 2012-02-27 MED ORDER — POTASSIUM CHLORIDE CRYS ER 20 MEQ PO TBCR
20.0000 meq | EXTENDED_RELEASE_TABLET | Freq: Once | ORAL | Status: AC
  Administered 2012-02-27: 20 meq via ORAL
  Filled 2012-02-27: qty 1

## 2012-02-27 NOTE — Progress Notes (Signed)
[facility] [place]  Subjective  - POD #3  The patient has no complaints today. He has been ambulating frequently today. He is tolerating his diet. His pain appears to be well-controlled.   Physical Exam:  Dressings are clean dry and intact. He has mild edema in the left leg. There are very brisk Doppler signals in the posterior tibial and dorsalis pedis artery       Assessment/Plan:  Doing very well  Hypokalemia: The patient will receive by mouth supplementation Disposition: Most likely, the patient will be able to be discharged tomorrow. Infection: White blood cell count slightly elevated, but the patient remains afebrile, will continue to monitor. He remains on Cipro Acute blood loss anemia: Hematocrit is down to 29.9 from 32.7. The patient is asymptomatic. We'll continue to monitor  [name] IV, V. WELLS 02/27/2012 2:15 PM --  Filed Vitals:   02/27/12 1357  BP: 122/55  Pulse:   Temp:   Resp:     Intake/Output Summary (Last 24 hours) at 02/27/12 1415 Last data filed at 02/26/12 1600  Gross per 24 hour  Intake    120 ml  Output     50 ml  Net     70 ml     Laboratory CBC    Component Value Date/Time   WBC 12.9* 02/27/2012 0540   HGB 10.6* 02/27/2012 0540   HCT 29.9* 02/27/2012 0540   PLT 181 02/27/2012 0540    BMET    Component Value Date/Time   NA 136 02/27/2012 0540   K 3.5 02/27/2012 0540   CL 102 02/27/2012 0540   CO2 21 02/27/2012 0540   GLUCOSE 114* 02/27/2012 0540   BUN 14 02/27/2012 0540   CREATININE 1.71* 02/27/2012 0540   CALCIUM 8.8 02/27/2012 0540   GFRNONAA 41* 02/27/2012 0540   GFRAA 47* 02/27/2012 0540    COAG Lab Results  Component Value Date   INR 1.07 02/18/2012   INR 1.2 05/21/2008   INR 1.0 05/17/2008   No results found for this basename: PTT    Antibiotics Anti-infectives     Start     Dose/Rate Route Frequency Ordered Stop   02/26/12 0800   ciprofloxacin (CIPRO) tablet 500 mg        500  mg Oral 2 times daily 02/26/12 0739     02/26/12 0300   cefUROXime (ZINACEF) 1.5 g in dextrose 5 % 50 mL IVPB        1.5 g 100 mL/hr over 30 Minutes Intravenous Every 12 hours 02/25/12 1841 02/26/12 1529   02/25/12 1115   cefUROXime (ZINACEF) 750 mg in dextrose 5 % 50 mL IVPB  Status:  Discontinued        750 mg 100 mL/hr over 30 Minutes Intravenous To Surgery 02/25/12 1100 02/25/12 1830   02/24/12 1149   cefUROXime (ZINACEF) 1.5 g in dextrose 5 % 50 mL IVPB        1.5 g 100 mL/hr over 30 Minutes Intravenous 30 min pre-op 02/24/12 1149 02/25/12 1524           V. Charlena Cross, M.D. [facility] [place] Office: [phone] Pager:  [phone]

## 2012-02-28 ENCOUNTER — Telehealth: Payer: Self-pay | Admitting: Vascular Surgery

## 2012-02-28 DIAGNOSIS — Z48812 Encounter for surgical aftercare following surgery on the circulatory system: Secondary | ICD-10-CM

## 2012-02-28 LAB — GLUCOSE, CAPILLARY
Glucose-Capillary: 128 mg/dL — ABNORMAL HIGH (ref 70–99)
Glucose-Capillary: 66 mg/dL — ABNORMAL LOW (ref 70–99)
Glucose-Capillary: 114 mg/dL — ABNORMAL HIGH (ref 70–99)
Glucose-Capillary: 174 mg/dL — ABNORMAL HIGH (ref 70–99)

## 2012-02-28 LAB — BASIC METABOLIC PANEL
CO2: 25 mEq/L (ref 19–32)
Chloride: 104 mEq/L (ref 96–112)
Creatinine, Ser: 1.64 mg/dL — ABNORMAL HIGH (ref 0.50–1.35)
GFR calc Af Amer: 50 mL/min — ABNORMAL LOW (ref >90)
Potassium: 3.5 mEq/L (ref 3.5–5.1)
Sodium: 139 mEq/L (ref 135–145)

## 2012-02-28 LAB — CBC
HCT: 26.6 % — ABNORMAL LOW (ref 39.0–52.0)
Hemoglobin: 9.4 g/dL — ABNORMAL LOW (ref 13.0–17.0)
MCV: 84.4 fL (ref 78.0–100.0)
RBC: 3.15 MIL/uL — ABNORMAL LOW (ref 4.22–5.81)
WBC: 8.1 10*3/uL (ref 4.0–10.5)

## 2012-02-28 LAB — PREPARE RBC (CROSSMATCH)

## 2012-02-28 MED ORDER — BISACODYL 10 MG RE SUPP: 10.0000 mg | Freq: Every day | RECTAL | Status: DC | PRN

## 2012-02-28 MED ORDER — OXYCODONE-ACETAMINOPHEN 5-325 MG PO TABS: 1.0000 | ORAL_TABLET | Freq: Four times a day (QID) | ORAL | Status: DC | PRN

## 2012-02-28 MED ORDER — CIPROFLOXACIN HCL 500 MG PO TABS: 500.0000 mg | ORAL_TABLET | Freq: Two times a day (BID) | ORAL | Status: DC

## 2012-02-28 MED ORDER — FLEET ENEMA 7-19 GM/118ML RE ENEM
1.0000 | ENEMA | Freq: Every day | RECTAL | Status: DC | PRN
  Filled 2012-02-28: qty 1

## 2012-02-28 MED ORDER — MAGNESIUM CITRATE PO SOLN
0.5000 | Freq: Every day | ORAL | Status: DC | PRN
  Filled 2012-02-28: qty 296

## 2012-02-28 NOTE — Progress Notes (Signed)
VASCULAR LAB PRELIMINARY  ARTERIAL  ABI completed:    RIGHT    LEFT    PRESSURE WAVEFORM  PRESSURE WAVEFORM  BRACHIAL 135 Triphasic  BRACHIAL 117 Monophasic   DP  absent DP 134 Monophasic   AT   AT    PT 97 Monophasic  PT 127 Monophasic   PER 98 Monophasic  PER    GREAT TOE  NA GREAT TOE  NA    RIGHT LEFT  ABI 0.73 0.99     Nickolaus Bordelon, RVT 02/28/2012, 2:06 PM

## 2012-02-28 NOTE — Progress Notes (Addendum)
[facility] Progress Note  02/28/2012 7:34 AM POD 4  Subjective:  No complaints.  Has walked in room, but not in halls  Tm 99.3 Filed Vitals:   02/28/12 0621  BP: 101/68  Pulse: 79  Temp: 98.8 F (37.1 C)  Resp: 18    Physical Exam: Incisions:  Right leg vein harvest sight c/d/i with staples in tact; bilateral groins are c/d/i without drainage. Extremities:  Left foot is warm with palpable DP/PT  CBC    Component Value Date/Time   WBC 8.1 02/28/2012 0550   RBC 3.15* 02/28/2012 0550   HGB 9.4* 02/28/2012 0550   HCT 26.6* 02/28/2012 0550   PLT 189 02/28/2012 0550   MCV 84.4 02/28/2012 0550   MCH 29.8 02/28/2012 0550   MCHC 35.3 02/28/2012 0550   RDW 14.6 02/28/2012 0550   LYMPHSABS 1.0 02/18/2012 1420   MONOABS 0.9 02/18/2012 1420   EOSABS 0.1 02/18/2012 1420   BASOSABS 0.2* 02/18/2012 1420    BMET    Component Value Date/Time   NA 139 02/28/2012 0550   K 3.5 02/28/2012 0550   CL 104 02/28/2012 0550   CO2 25 02/28/2012 0550   GLUCOSE 137* 02/28/2012 0550   BUN 15 02/28/2012 0550   CREATININE 1.64* 02/28/2012 0550   CALCIUM 8.6 02/28/2012 0550   GFRNONAA 43* 02/28/2012 0550   GFRAA 50* 02/28/2012 0550    INR    Component Value Date/Time   INR 1.07 02/18/2012 1924    No intake or output data in the 24 hours ending 02/28/12 0734      Assessment/Plan:  66 y.o. male is s/p  1. Bilateral iliofemoral endarterectomies with bovine patch angioplasty 2. Right to left femorofemoral bypass 3. Composite conduit creation with external reinforced Propaten/Right non-reversed greater saphenous vein 4. Left common femoral artery to posterior tibial artery bypass with composite conduit  5. Right common femoral artery open cannulation 6. Left leg runoff 7.   POD 4  -possibly home soon, if ambulates in halls today without difficulty. -Hgb down slightly-no evidence of bleeding -tm 99.3-WBC back to WNL.  Blood cx NGTD;  pt on Cipro  Doreatha Massed, PA-C [facility] [phone] 02/28/2012 7:34 AM   Addendum  I have independently interviewed and examined the patient, and I agree with the physician assistant's findings.  Both groins without any frank evidence of hematoma.  Some fullness in right groin, no unexpected post-op.  No evidence of bleeding into vein harvest bed.  Left calf is soft without any fullness.  Pt denies any hematemezia or hematochezia.  Guaic stool.  Transfused 1 unit pRBC as a cardiac precaution.  Continue ambulation.  ABI pending.  Medications for constipation per pt's wishes.   Leonides Sake, MD [facility] [place] Office: [phone] Pager: [phone]  02/28/2012, 8:29 AM

## 2012-02-28 NOTE — Progress Notes (Signed)
Physical Therapy Treatment Patient Details Name: [name] MRN: [id number] DOB: 05/07/1945 Today's Date: 02/28/2012 Time: 4098-1191 PT Time Calculation (min): 23 min  PT Assessment / Plan / Recommendation Comments on Treatment Session  pt presents with Fem to Fem BPG.  pt with improved mobility today and decreased pain.  pt still with some confusion and flat affect.      Follow Up Recommendations  SNF (vs HHPT if 24 hour assist available.)     Does the patient have the potential to tolerate intense rehabilitation     Barriers to Discharge        Equipment Recommendations  Rolling walker with 5" wheels    Recommendations for Other Services    Frequency Min 3X/week   Plan Discharge plan remains appropriate;Frequency remains appropriate    Precautions / Restrictions Precautions Precautions: Fall Restrictions Weight Bearing Restrictions: No   Pertinent Vitals/Pain Indicates L LE stiff at back of knee.    Mobility  Bed Mobility Bed Mobility: Supine to Sit;Sitting - Scoot to Edge of Bed Supine to Sit: 5: Supervision;With rails;HOB elevated Sitting - Scoot to Edge of Bed: 5: Supervision Transfers Transfers: Sit to Stand;Stand to Sit Sit to Stand: 4: Min guard;With upper extremity assist;From bed Stand to Sit: 4: Min guard;With upper extremity assist;To chair/3-in-1;With armrests Details for Transfer Assistance: cues for use of UEs and to control descent to chair.   Ambulation/Gait Ambulation/Gait Assistance: 4: Min guard Ambulation Distance (Feet): 180 Feet Assistive device: Rolling walker Ambulation/Gait Assistance Details: ceus for staying closer to RW, upright posture, safety with turns.   Gait Pattern: Step-to pattern;Decreased stride length;Trunk flexed;Narrow base of support;Antalgic Stairs: No Wheelchair Mobility Wheelchair Mobility: No    Exercises     PT Diagnosis:    PT Problem List:   PT Treatment Interventions:     PT Goals Acute Rehab PT  Goals Time For Goal Achievement: 03/11/12 Potential to Achieve Goals: Good PT Goal: Supine/Side to Sit - Progress: Progressing toward goal PT Goal: Sit to Stand - Progress: Progressing toward goal PT Goal: Stand to Sit - Progress: Progressing toward goal PT Goal: Ambulate - Progress: Progressing toward goal  Visit Information  Last PT Received On: 02/28/12 Assistance Needed: +1    Subjective Data  Subjective: I guess I can walk.     Cognition  Overall Cognitive Status: Impaired Area of Impairment: Memory Arousal/Alertness: Awake/alert Orientation Level: Time;Disoriented to Behavior During Session: Flat affect    Balance  Balance Balance Assessed: No  End of Session PT - End of Session Equipment Utilized During Treatment: Gait belt Activity Tolerance: Patient tolerated treatment well Patient left: in chair;with call bell/phone within reach Nurse Communication: Mobility status   GP     Sunny Schlein, [place] [phone] 02/28/2012, 12:14 PM

## 2012-02-28 NOTE — Progress Notes (Addendum)
Upon assessment of the pt, noticed that he has very flat affect and when asked where he was, pt could not tell this RN. Pt knew he was in the hospital. Pt did not know the date or the day. Pt could state his name and date of birth. Pt can follow commands and moves arms/legs purposely.  However, pt seems "off." Notified PA Lelon Mast of this. Will try to get an idea of pt's baseline from his gf. Have tried to call several times. Will continue to monitor.

## 2012-02-28 NOTE — Progress Notes (Signed)
Occupational Therapy Treatment Patient Details Name: [name] MRN: [id number] DOB: Aug 17, 1945 Today's Date: 02/28/2012 Time: 6578-4696 OT Time Calculation (min): 25 min  OT Assessment / Plan / Recommendation Comments on Treatment Session Pt with good participation with mobility. Pt limited by apparent confusion/decreased cognitive status and requires mod A for functional basic problem solving. Pt with poor anticipatory awareness. Pt oriented to self and situation, but not to time or place. Expressed concerns over cognitive status to nsg, as per his family, this is not his baseline. If pt is to D/C home, he will need 24/7 S. If 24/7 S not available, pt will need SNF.    Follow Up Recommendations  Home health OT;SNF (hh vs SNF)    Barriers to Discharge   none    Equipment Recommendations  3 in 1 bedside comode    Recommendations for Other Services  Address acute mental status changes  Frequency Min 2X/week   Plan Discharge plan remains appropriate    Precautions / Restrictions Precautions Precautions: Fall Restrictions Weight Bearing Restrictions: No   Pertinent Vitals/Pain No c/o pain    ADL  Grooming: Supervision/safety;Wash/dry hands;Wash/dry face Where Assessed - Grooming: Unsupported standing Upper Body Bathing: Supervision/safety;Set up Where Assessed - Upper Body Bathing: Unsupported sitting Lower Body Bathing: Supervision/safety;Set up;Min guard Where Assessed - Lower Body Bathing: Unsupported sitting Upper Body Dressing: Supervision/safety;Set up Where Assessed - Upper Body Dressing: Unsupported sitting Lower Body Dressing: Supervision/safety;Set up Where Assessed - Lower Body Dressing: Supported sit to stand Toilet Transfer: Hydrographic surveyor Method: Sit to stand;Stand Wellsite geologist: Comfort height toilet Toileting - Architect and Hygiene: Supervision/safety Where Assessed - Engineer, mining and Hygiene:  Standing Equipment Used: Gait belt;Rolling walker Transfers/Ambulation Related to ADLs: minguard ADL Comments: Limited by apparent decreased cognitive status    OT Diagnosis:    OT Problem List:   OT Treatment Interventions:     OT Goals Acute Rehab OT Goals OT Goal Formulation: With patient Time For Goal Achievement: 03/11/12 Potential to Achieve Goals: Good ADL Goals Pt Will Perform Grooming: with supervision;Standing at sink;Other (comment) ADL Goal: Grooming - Progress: Progressing toward goals Pt Will Perform Lower Body Bathing: with supervision;Sit to stand from chair ADL Goal: Lower Body Bathing - Progress: Progressing toward goals Pt Will Perform Lower Body Dressing: with supervision;Sit to stand from bed ADL Goal: Lower Body Dressing - Progress: Progressing toward goals Pt Will Transfer to Toilet: with modified independence;with DME;3-in-1 ADL Goal: Toilet Transfer - Progress: Progressing toward goals Pt Will Perform Tub/Shower Transfer: with supervision;with DME;Ambulation;Other (comment) ADL Goal: Tub/Shower Transfer - Progress: Progressing toward goals  Visit Information  Last OT Received On: 02/28/12 Assistance Needed: +1    Subjective Data      Prior Functioning       Cognition  Overall Cognitive Status: Impaired Area of Impairment: Attention;Memory;Following commands;Safety/judgement;Awareness of errors;Awareness of deficits;Problem solving;Executive functioning Arousal/Alertness: Awake/alert Orientation Level: Disoriented to;Place;Time;Situation Behavior During Session: Flat affect Current Attention Level: Sustained Attention - Other Comments: distracted by external stimuli Memory: Decreased recall of precautions Memory Deficits: difficulty recalling room #. Unable to recall 2nd step in 2 part task Following Commands: Follows one step commands with increased time Safety/Judgement: Decreased awareness of need for assistance Awareness of Errors:  Assistance required to correct errors made Awareness of Errors - Other Comments: cues to find room Awareness of Deficits: emergent awareness Problem Solving: mod cues for funcitonal basic. looking in toilet room for sink when sink was in front of  him. asking  where soap is located, when soap was located in front of him Executive Functioning: poor anticipatory awareness. flat affect. decreased attention. difficulty with simple problem solving Cognition - Other Comments: Appears confused. Unsafe to be alone    Mobility  Shoulder Instructions Bed Mobility Bed Mobility: Supine to Sit;Sitting - Scoot to Edge of Bed Supine to Sit: 6: Modified independent (Device/Increase time) Sitting - Scoot to Edge of Bed: 6: Modified independent (Device/Increase time) Transfers Transfers: Sit to Stand;Stand to Sit Sit to Stand: 5: Supervision;With upper extremity assist;From bed Stand to Sit: 5: Supervision;With upper extremity assist;To chair/3-in-1 Details for Transfer Assistance: cues for safe mobility       Exercises      Balance  min /a   End of Session OT - End of Session Equipment Utilized During Treatment: Gait belt Activity Tolerance: Patient tolerated treatment well Patient left: in chair;with call bell/phone within reach Nurse Communication: Mobility status;Other (comment) (apparnet confusion)  GO     Edrees Valent,HILLARY 02/28/2012, 4:11 PM [facility], OTR/L  [phone] 02/28/2012

## 2012-02-28 NOTE — Progress Notes (Signed)
Notified MD Edilia Bo of pt's mental status today. MD ordered CT of head to rule out stroke. Pt able to ambulate. Pt's grips strong, no arm drift, no facial droop, no slurred speech. Will continue to monitor.

## 2012-02-28 NOTE — Progress Notes (Signed)
VASCULAR PROGRESS NOTE  SUBJECTIVE: Called to see the patient because of a change in mental status. The nurse reports that according to the family he has been acting differently today. Currently there is no family available. He is answering questions appropriately. He knew that it was 2013. He knew that Dr. Imogene Burn to his surgery. He knew that he had a bypass on his left leg. He knew he was in the hospital. He has no specific complaints.  He is now 3 days after her revascularization. He underwent a right to left fem-fem bypass graft and a left femoral to posterior tibial artery bypass graft.   PHYSICAL EXAM: Filed Vitals:   02/28/12 1120 02/28/12 1220 02/28/12 1320 02/28/12 1347  BP: 110/68 107/67 131/73 130/71  Pulse: 64 63 62 61  Temp: 98.4 F (36.9 C) 98.3 F (36.8 C) 97.3 F (36.3 C) 98.4 F (36.9 C)  TempSrc: Oral Oral Oral Oral  Resp: 16 17 18 17   Height:      Weight:      SpO2:       Lungs: Clear bilaterally. Neuro: No focal weakness. Feet warm. Dressings dry.  LABS: Lab Results  Component Value Date   WBC 8.1 02/28/2012   HGB 9.4* 02/28/2012   HCT 26.6* 02/28/2012   MCV 84.4 02/28/2012   PLT 189 02/28/2012   Lab Results  Component Value Date   CREATININE 1.64* 02/28/2012   Lab Results  Component Value Date   INR 1.07 02/18/2012   CBG (last 3)   Basename 02/28/12 1111 02/28/12 0620 02/27/12 2057  GLUCAP 128* 142* 130*   ASSESSMENT/PLAN: 1. Reported change in mental status earlier. Currently the patient is alert and oriented with no focal neurologic findings. He has no evidence of sepsis. He is afebrile with a normal white blood cell count. His blood sugars are normal. He has no significant electrolyte abnormalities. He is not hypoxic. He has not received any pain medicine recently. I had initially ordered a CT scan of the head to rule out a stroke however given his exam I will cancel that.  Cari Caraway Beeper: [phone] 02/28/2012

## 2012-02-28 NOTE — Progress Notes (Addendum)
Hypoglycemic Event  CBG: 66  Treatment: 15 GM carbohydrate snack 1Symptoms: None  Follow-up CBG: Time:1715 CBG Result:114  Possible Reasons for Event: Inadequate meal intake  Comments/MD notified:    Heide Guile V  Remember to initiate Hypoglycemia Order Set & complete

## 2012-02-28 NOTE — Progress Notes (Signed)
Pt ambulated with rolling walker approx. 500 feet. Pt tolerated well. Spoke with pt's friend Junious Dresser and says pt is "not right." He was like this last admission and this is not his personality, and she said he seems "high and not with it." Notified PA of this earlier. Will continue to monitor.

## 2012-02-28 NOTE — Progress Notes (Signed)
Pt jumped out of bed claiming there was smoke coming out of the floor. Assured pt there was not and helped him back into bed. Pt states he did not know where he was. Re-oriented pt to the room. Will continue to monitor.

## 2012-02-28 NOTE — Telephone Encounter (Signed)
Message copied by Margaretmary Eddy on Thu Feb 28, 2012  9:55 AM ------      Message from: Lorin Mercy K      Created: Thu Feb 28, 2012  8:24 AM      Regarding: Schedule                   ----- Message -----         From: Dara Lords, PA         Sent: 02/28/2012   7:45 AM           To: Sharee Pimple, CMA            Bilateral iliofemoral endarterectomies with bovine patch angioplasty      Right to left femorofemoral bypass      Composite conduit creation with external reinforced Propaten/Right non-reversed greater saphenous vein      Left common femoral artery to posterior tibial artery bypass with composite conduit        Right common femoral artery open cannulation      Left leg runoff             By Dr. Imogene Burn 02/24/12.  F/u with him in 2 weeks.            Thanks,      Lelon Mast

## 2012-02-29 LAB — CBC
Hemoglobin: 10.8 g/dL — ABNORMAL LOW (ref 13.0–17.0)
MCH: 30.2 pg (ref 26.0–34.0)
Platelets: 219 10*3/uL (ref 150–400)
RBC: 3.58 MIL/uL — ABNORMAL LOW (ref 4.22–5.81)
WBC: 7 10*3/uL (ref 4.0–10.5)

## 2012-02-29 LAB — TYPE AND SCREEN
Antibody Screen: NEGATIVE
Unit division: 0

## 2012-02-29 LAB — GLUCOSE, CAPILLARY: Glucose-Capillary: 155 mg/dL — ABNORMAL HIGH (ref 70–99)

## 2012-02-29 LAB — BASIC METABOLIC PANEL
CO2: 26 mEq/L (ref 19–32)
Chloride: 102 mEq/L (ref 96–112)
Glucose, Bld: 164 mg/dL — ABNORMAL HIGH (ref 70–99)
Potassium: 3.5 mEq/L (ref 3.5–5.1)
Sodium: 139 mEq/L (ref 135–145)

## 2012-02-29 NOTE — Progress Notes (Signed)
[facility] [place]  Progress Note Bypass Surgery  Date of Surgery: 02/25/2012  Procedure(s): BYPASS GRAFT FEMORAL-FEMORAL ARTERY BYPASS GRAFT FEMORAL-TIBIAL ARTERY ENDARTERECTOMY FEMORAL PATCH ANGIOPLASTY INTRA OPERATIVE ARTERIOGRAM Surgeon: Surgeon(s): Fransisco Hertz, MD  4 Days Post-Op  History of Present Illness  [name] is a 66 y.o. male who is S/P Procedure(s): BYPASS GRAFT FEMORAL-FEMORAL ARTERY BYPASS GRAFT LEFT FEMORAL-TIBIAL ARTERY ENDARTERECTOMY FEMORAL PATCH ANGIOPLASTY INTRA OPERATIVE ARTERIOGRAM .  The patient's pre-op symptoms of pain are Improved . Patients pain is well controlled.    VASC. LAB Studies:        ABI: Right 0.73;  Left 0.99;   Imaging: No results found.  Significant Diagnostic Studies: CBC Lab Results  Component Value Date   WBC 7.0 02/29/2012   HGB 10.8* 02/29/2012   HCT 30.3* 02/29/2012   MCV 84.6 02/29/2012   PLT 219 02/29/2012    BMET     Component Value Date/Time   NA 139 02/29/2012 0544   K 3.5 02/29/2012 0544   CL 102 02/29/2012 0544   CO2 26 02/29/2012 0544   GLUCOSE 164* 02/29/2012 0544   BUN 15 02/29/2012 0544   CREATININE 1.63* 02/29/2012 0544   CALCIUM 9.0 02/29/2012 0544   GFRNONAA 43* 02/29/2012 0544   GFRAA 50* 02/29/2012 0544    COAG Lab Results  Component Value Date   INR 1.07 02/18/2012   INR 1.2 05/21/2008   INR 1.0 05/17/2008   No results found for this basename: PTT    Physical Examination  BP Readings from Last 3 Encounters:  02/29/12 99/62  02/29/12 99/62  02/22/12 138/82   Temp Readings from Last 3 Encounters:  02/29/12 97.6 F (36.4 C) Oral  02/29/12 97.6 F (36.4 C) Oral  02/22/12 98.4 F (36.9 C)    SpO2 Readings from Last 3 Encounters:  02/29/12 100%  02/29/12 100%  02/22/12 99%   Pulse Readings from Last 3 Encounters:  02/29/12 62  02/29/12 62  02/22/12 90    Pt is A&O x 3 Groin and bilateral lower extremity: Incision/s is/are  clean,dry.intact, and  healing without hematoma, erythema or drainage Limb is warm; with good color Left palpable DP/PT pulses. Right vein harvest incisions clean and dry- staples    Assessment/Plan: Pt. Doing well Post-op pain is controlled Wounds are healing well PT/OT for ambulation Pending Home discharge once care manager talks with family verses SNF today if possible.  Clinton [facility] [phone] 02/29/2012 7:23 AM

## 2012-02-29 NOTE — Progress Notes (Signed)
[facility] [place]  Daily Progress Note  Assessment/Planning: POD #4 s/p L-R fem-fem BPG, B iliofem EA with BPA, L fem-PT bypass with composite   Stable H/H, appropriate response to tranfusion, no clinical evidence of bleeding  Abnormal behavior similar to previous admissions: suspect some degree of sundowning, subsequently I don't think the patient will do well in SNF due to unfamiliar setting  PT/OT: home OT rec., home PT vs SNF rec.  I think this pt will do better with home services  Pt should be able to D/C today or tomorrow  Subjective  - 4 Days Post-Op  No complaints, overnight events noted  Objective Filed Vitals:   02/28/12 1320 02/28/12 1347 02/28/12 2051 02/29/12 0612  BP: 131/73 130/71 101/66 99/62  Pulse: 62 61 79 62  Temp: 97.3 F (36.3 C) 98.4 F (36.9 C) 99.3 F (37.4 C) 97.6 F (36.4 C)  TempSrc: Oral Oral Oral Oral  Resp: 18 17 19 17   Height:      Weight:      SpO2:   100% 100%    Intake/Output Summary (Last 24 hours) at 02/29/12 0703 Last data filed at 02/28/12 1347  Gross per 24 hour  Intake 844.17 ml  Output      0 ml  Net 844.17 ml    PULM  CTAB CV  RRR GI  soft, NTND VASC  B groin inc soft, c/d/i,; L calf inc c/d/i, soft, palpable DP and PT, R vein harvest staples in place without bleeding  Laboratory CBC    Component Value Date/Time   WBC 7.0 02/29/2012 0544   HGB 10.8* 02/29/2012 0544   HCT 30.3* 02/29/2012 0544   PLT 219 02/29/2012 0544    BMET    Component Value Date/Time   NA 139 02/29/2012 0544   K 3.5 02/29/2012 0544   CL 102 02/29/2012 0544   CO2 26 02/29/2012 0544   GLUCOSE 164* 02/29/2012 0544   BUN 15 02/29/2012 0544   CREATININE 1.63* 02/29/2012 0544   CALCIUM 9.0 02/29/2012 0544   GFRNONAA 43* 02/29/2012 0544   GFRAA 50* 02/29/2012 0544    Leonides Sake, MD [facility] [place] Office: [phone] Pager: [phone]  02/29/2012, 7:03 AM

## 2012-02-29 NOTE — Discharge Summary (Signed)
[facility] Discharge Summary  [name] 08/20/1945 66 y.o. male  [id number]  Admission Date: 02/25/2012  Discharge Date: 02/29/12  Physician: Fransisco Hertz, MD  Admission Diagnosis: PVD   HPI:   This is a 66 y.o. male who presents with chief complaint: left leg pain. Onset of symptom occurred years ago, though claims foot pain only started few weeks ago and left leg pain today. His cardiologist documents claudication not well managed medically due to continued smoking. Pain is described as "hurts", severity 5-10/10, and associated with rest and movement. Patient has attempted to treat this pain with rest. The patient has rest pain symptoms also and no leg wounds/ulcers. Atherosclerotic risk factors include: hyperlipidemia, DM, HTN, and current active smoker.  Hospital Course:  The patient was admitted to the hospital and taken to the operating room on 02/25/2012 and underwent  1. Bilateral iliofemoral endarterectomies with bovine patch angioplasty 2. Right to left femorofemoral bypass 3. Composite conduit creation with external reinforced Propaten/Right non-reversed greater saphenous vein 4. Left common femoral artery to posterior tibial artery bypass with composite conduit  5. Right common femoral artery open cannulation 6. Left leg runoff    The pt tolerated the procedure well and was transported to the PACU in good condition. By POD 1, the pt did have a fever of 102.1.  Blood cx were drawn and are NGTD.  Pt did have a UA, which revealed 0-2 WBC and the pt was treated with Cipro.  By POD 2, the pt was afebrile, but WBC was still slightly elevated.  He also had acute surgical blood loss anemia and he did receive a unit of PRBC's.  H&H has remained stable.  He had ABI's on 02/28/12 and are as follows:  RIGHT    LEFT     PRESSURE  WAVEFORM   PRESSURE  WAVEFORM   BRACHIAL  135  Triphasic  BRACHIAL  117  Monophasic   DP   absent  DP  134  Monophasic   AT     AT     PT  97  Monophasic  PT  127  Monophasic   PER  98  Monophasic  PER     GREAT TOE   NA  GREAT TOE   NA     RIGHT  LEFT   ABI  0.73  0.99    On POD 3, MD called about mental status change.  Reported change in mental status earlier. Currently the patient is alert and oriented with no focal neurologic findings. He has no evidence of sepsis. He is afebrile with a normal white blood cell count. His blood sugars are normal. He has no significant electrolyte abnormalities. He is not hypoxic. He has not received any pain medicine recently. I had initially ordered a CT scan of the head to rule out a stroke however given his exam I will cancel that.  Pt is discharged home on POD 4 as Dr. Imogene Burn suspects some degree of sundowning and feels the pt will be better off at home vs. SNF.     The remainder of the hospital course consisted of increasing mobilization and increasing intake of solids without difficulty.  CBC    Component Value Date/Time   WBC 7.0 02/29/2012 0544   RBC 3.58* 02/29/2012 0544   HGB 10.8* 02/29/2012 0544   HCT 30.3* 02/29/2012 0544   PLT 219 02/29/2012 0544   MCV 84.6 02/29/2012 0544   MCH 30.2 02/29/2012 0544  MCHC 35.6 02/29/2012 0544   RDW 14.6 02/29/2012 0544   LYMPHSABS 1.0 02/18/2012 1420   MONOABS 0.9 02/18/2012 1420   EOSABS 0.1 02/18/2012 1420   BASOSABS 0.2* 02/18/2012 1420    BMET    Component Value Date/Time   NA 139 02/29/2012 0544   K 3.5 02/29/2012 0544   CL 102 02/29/2012 0544   CO2 26 02/29/2012 0544   GLUCOSE 164* 02/29/2012 0544   BUN 15 02/29/2012 0544   CREATININE 1.63* 02/29/2012 0544   CALCIUM 9.0 02/29/2012 0544   GFRNONAA 43* 02/29/2012 0544   GFRAA 50* 02/29/2012 0544     Discharge Instructions:   The patient is discharged to home with extensive instructions on wound care and progressive ambulation.  They are instructed not to drive or perform any heavy lifting until returning to see the physician in his office.  Discharge  Orders    Future Appointments: Provider: Department: Dept Phone: Center:   03/14/2012 9:30 AM Fransisco Hertz, MD [facility] [phone] [facility]   05/12/2012 8:30 AM [name], MD [facility] Main Office [place]) [phone] [facility]   05/12/2012 8:45 AM [facility] Main Office [place]) [phone] [facility]   08/08/2012 8:15 AM Corwin Levins, MD [facility] [phone] [facility]     Future Orders Please Complete By Expires   Resume previous diet      Driving Restrictions      Comments:   No driving for 2 weeks   Lifting restrictions      Comments:   No lifting for 6 weeks   Call MD for:  temperature >100.5      Call MD for:  redness, tenderness, or signs of infection (pain, swelling, bleeding, redness, odor or green/yellow discharge around incision site)      Call MD for:  severe or increased pain, loss or decreased feeling  in affected limb(s)      Discharge wound care:      Comments:   Shower daily with soap and water starting 02/29/12      Discharge Diagnosis:  PVD  Secondary Diagnosis: Patient Active Problem List  Diagnosis  . DIABETES MELLITUS, TYPE II  . HYPERTENSION  . CAD  . BRADYCARDIA  . Vertebrobasilar artery syndrome  . TIA  . PSORIASIS  . Cardiac pacemaker in situ  . Benign hypertensive heart disease without heart failure  . Hx of CABG  . Intractable hiccoughs  . Tobacco abuse  . Pacemaker  . Vascular claudication  . Dyslipidemia  . Preventative health care  . GERD (gastroesophageal reflux disease)  . Nephrolithiasis  . Carotid stenosis  . Ischemic heart disease  . Ischemic leg  . Chronic kidney disease, stage 3  . Altered mental status  . Hallucinations  . Delirium   Past Medical History  Diagnosis Date  . Cerebellar stroke, acute   . IHD (ischemic heart disease)   . Hiccoughs   . Diabetes mellitus   . Hyperlipidemia   . Vitamin B12 deficiency   .  DIABETES MELLITUS, TYPE II 09/20/2008    Qualifier: Diagnosis of  By: Flonnie Overman    . HYPERTENSION 09/20/2008    Qualifier: Diagnosis of  By: Flonnie Overman    . CAD 09/20/2008    Qualifier: Diagnosis of  By: Flonnie Overman    . BRADYCARDIA 09/20/2008    Qualifier: Diagnosis of  By: Flonnie Overman    . Vertebrobasilar artery syndrome 09/20/2008    Qualifier: Diagnosis  of  By: Flonnie Overman    . TIA 09/20/2008    Qualifier: Diagnosis of  By: Flonnie Overman    . PSORIASIS 09/20/2008    Qualifier: Diagnosis of  By: Flonnie Overman    . Cardiac pacemaker in situ 10/08/2008    Qualifier: Diagnosis of  By: Ladona Ridgel, MD, Jerrell Mylar   . Benign hypertensive heart disease without heart failure 08/03/2010  . Hx of CABG 08/03/2010  . Intractable hiccoughs 08/03/2010  . Tobacco abuse 08/03/2010  . Vascular claudication 11/15/2010  . Dyslipidemia 09/05/2011  . GERD (gastroesophageal reflux disease) 11/06/2011  . Nephrolithiasis 11/06/2011  . Carotid stenosis 11/06/2011    Bilat 60-80% 2010  . Pacemaker   . Complication of anesthesia     had hallucinations after anesthesia  . Myocardial infarction   . Pneumonia     hx of  . Shortness of breath     ambulation      Vartan, Kerins  Home Medication Instructions ZOX:[id number]   Printed on:02/29/12 1441  Medication Information                    aspirin 81 MG tablet Take 81 mg by mouth daily.             nitroGLYCERIN (NITROSTAT) 0.4 MG SL tablet Place 0.4 mg under the tongue every 5 (five) minutes as needed. For chest pain           baclofen (LIORESAL) 10 MG tablet Take 10 mg by mouth Daily.           metoprolol succinate (TOPROL-XL) 100 MG 24 hr tablet Take 100 mg by mouth daily. Take with or immediately following a meal.           ranitidine (ZANTAC) 150 MG tablet TAKE 1 TABLET BY MOUTH TWICE A DAY           dexlansoprazole (DEXILANT) 60 MG capsule Take 1 capsule (60 mg total) by mouth daily.           pantoprazole (PROTONIX) 40 MG  tablet Take 40 mg by mouth daily.           atorvastatin (LIPITOR) 80 MG tablet Take 1 tablet (80 mg total) by mouth daily.           chlorproMAZINE (THORAZINE) 25 MG tablet Take 25 mg by mouth daily.           amLODipine (NORVASC) 5 MG tablet Take 1 tablet (5 mg total) by mouth daily.           lisinopril-hydrochlorothiazide (PRINZIDE,ZESTORETIC) 20-12.5 MG per tablet Take 1 tablet by mouth daily.           ezetimibe (ZETIA) 10 MG tablet Take 1 tablet (10 mg total) by mouth daily.           glimepiride (AMARYL) 4 MG tablet Take 1 tablet (4 mg total) by mouth daily before breakfast.           ciprofloxacin (CIPRO) 500 MG tablet Take 1 tablet (500 mg total) by mouth 2 (two) times daily.           oxyCODONE-acetaminophen (PERCOCET/ROXICET) 5-325 MG per tablet Take 1-2 tablets by mouth every 6 (six) hours as needed. #30 NR            Disposition: home with HH  Patient's condition: is Good  Follow up: 1. Dr. Imogene Burn in 2 weeks   Doreatha Massed, PA-C [facility] [phone] 02/29/2012  2:41 PM  Addendum  I have independently interviewed and examined the patient, and I agree with the physician assistant's discharge summary.  This patient underwent a difficult R to L fem-fem bypass after B iliofemoral endarterectomies with bovine patch angioplasty, followed by a R fem-PT bypass with a composite Propaten/GSV conduit due to poor conduit.  His post-op course was unremarkable except for some acute anemia likely due to intraoperative blood loss.  He receive one unit of pRBC and maintained his H/H.  He is being discharged with home PT/OT and will follow up in the office in 2 weeks.  Leonides Sake, MD [facility] [place] Office: [phone] Pager: [phone]  02/29/2012, 4:18 PM

## 2012-02-29 NOTE — Progress Notes (Signed)
Physical Therapy Treatment Patient Details Name: [name] MRN: [id number] DOB: December 10, 1945 Today's Date: 02/29/2012 Time: 1308-6578 PT Time Calculation (min): 16 min  PT Assessment / Plan / Recommendation Comments on Treatment Session  pt presents with Fem to Fem BPG.  pt continues to improve mobility, but continues to have difficulty with cognition.  pt needs 24hr A at home for safety.  pt notes he normally drives and handles bills, yet he is unable to complete simple math tasks and has poor awareness of deficits.  If family unable to provide 24hr A may need to consider SNF until pt clears.      Follow Up Recommendations  SNF (vs HHPT if 24 hour assist available.)     Does the patient have the potential to tolerate intense rehabilitation     Barriers to Discharge        Equipment Recommendations  Rolling walker with 5" wheels    Recommendations for Other Services    Frequency Min 3X/week   Plan Discharge plan remains appropriate;Frequency remains appropriate    Precautions / Restrictions Precautions Precautions: Fall Restrictions Weight Bearing Restrictions: No   Pertinent Vitals/Pain Indicates a little tender at back of L knee.      Mobility  Bed Mobility Bed Mobility: Supine to Sit;Sitting - Scoot to Edge of Bed Supine to Sit: 6: Modified independent (Device/Increase time);With rails Sitting - Scoot to Edge of Bed: 6: Modified independent (Device/Increase time) Transfers Transfers: Sit to Stand;Stand to Sit Sit to Stand: 5: Supervision;With upper extremity assist;From bed Stand to Sit: 5: Supervision;With upper extremity assist;To chair/3-in-1;With armrests Details for Transfer Assistance: cues for UE use and to control descent to chair.   Ambulation/Gait Ambulation/Gait Assistance: 4: Min guard Ambulation Distance (Feet): 200 Feet Assistive device: Rolling walker Ambulation/Gait Assistance Details: cues for staying closer to RW, upright posture.   Gait  Pattern: Step-to pattern;Decreased stride length;Trunk flexed;Narrow base of support;Antalgic Stairs: No Wheelchair Mobility Wheelchair Mobility: No    Exercises     PT Diagnosis:    PT Problem List:   PT Treatment Interventions:     PT Goals Acute Rehab PT Goals Time For Goal Achievement: 03/11/12 Potential to Achieve Goals: Good PT Goal: Supine/Side to Sit - Progress: Met PT Goal: Sit to Stand - Progress: Progressing toward goal PT Goal: Stand to Sit - Progress: Progressing toward goal PT Goal: Ambulate - Progress: Progressing toward goal  Visit Information  Last PT Received On: 02/29/12 Assistance Needed: +1    Subjective Data  Subjective: I feel better today.     Cognition  Overall Cognitive Status: Impaired Area of Impairment: Attention;Memory;Awareness of errors;Awareness of deficits;Problem solving;Executive functioning;Following commands;Safety/judgement Arousal/Alertness: Awake/alert Orientation Level: Disoriented to;Time;Situation (With cueing able to guess time) Behavior During Session: Flat affect Current Attention Level: Selective Memory Deficits: pt able to recall room number this am, but difficulty recalling home situation when asked about it and then just states it's complicated.   Following Commands: Follows one step commands with increased time Safety/Judgement: Decreased safety judgement for tasks assessed;Decreased awareness of need for assistance Awareness of Errors: Assistance required to correct errors made Awareness of Deficits: Unaware of need for A.   Problem Solving: Slow to process, Difficulty with basic money math while ambulating.  pt indicates he handles bills and driving at home.   Executive Functioning: poor anticipatory awareness. flat affect. decreased attention. difficulty with simple problem solving Cognition - Other Comments: Appears confused. Unsafe to be alone    Balance  Balance Balance  Assessed: No  End of Session PT - End of  Session Equipment Utilized During Treatment: Gait belt Activity Tolerance: Patient tolerated treatment well Patient left: in chair;with call bell/phone within reach Nurse Communication: Mobility status   GP     Sunny Schlein, [place] [phone] 02/29/2012, 8:33 AM

## 2012-03-03 LAB — CULTURE, BLOOD (SINGLE)

## 2012-03-13 ENCOUNTER — Encounter: Payer: Self-pay | Admitting: Vascular Surgery

## 2012-03-14 ENCOUNTER — Encounter: Payer: Self-pay | Admitting: Vascular Surgery

## 2012-03-14 ENCOUNTER — Ambulatory Visit (INDEPENDENT_AMBULATORY_CARE_PROVIDER_SITE_OTHER): Payer: Medicare Other | Admitting: Vascular Surgery

## 2012-03-14 VITALS — BP 85/64 | HR 66 | Temp 97.4°F | Ht 66.0 in | Wt 166.2 lb

## 2012-03-14 DIAGNOSIS — I739 Peripheral vascular disease, unspecified: Secondary | ICD-10-CM | POA: Insufficient documentation

## 2012-03-14 DIAGNOSIS — Z48812 Encounter for surgical aftercare following surgery on the circulatory system: Secondary | ICD-10-CM

## 2012-03-14 NOTE — Progress Notes (Signed)
[facility] [place]  Postoperative Visit  History of Present Illness  [name] is a 67 y.o. year old male who presents for postoperative follow-up for: B iliofem EA w/ BPA, R to L fem-fem BPG, L fem-PT w/ composite Propaten/GSV (Date: 02/25/12).  The patient's wounds are healing.  The patient notes  resolution of lower extremity symptoms.  The patient is  able to complete their activities of daily living.  The patient's current symptoms are: drainage from left calf yesterday.  Pt continues to smoke  Physical Examination  Filed Vitals:   03/14/12 0921  BP: 85/64  Pulse: 66  Temp: 97.4 F (36.3 C)   RLE: vein harvest is healed, staples in place R groin: inc c/d/i, Dermabond in place L groin: small amount of superficial separation in lower pole, no drainage L calf inc c/d/i, somewhat ballotable Palpable PT, faintly palpble DP L 5th toe is dusky  Medical Decision Making  [name] is a 67 y.o. year old male who presents s/p B iliofem EA w/ BPA, R to L fem-fem BPG, L fem-PT w/ composite Propaten/GSV .  I reiterated again to the patient as I have multiple times previously that he needs to stop smoking are he is likely to heal poorly, possibly leading to an infection of his bypass conduit which is partially prosthetic.  I reiterated to him if this bypass goes down, he will likely need an amputation of the left foot.  I gave him instructions in regards to wound care to both groin, which I can tell he has been neglecting.  I recommending washing his groin twice a day then applying neosporin and then bandaging both incisions.  I will have him follow up in one month to re-evaluate his wounds. I discussed in depth with the patient the nature of atherosclerosis, and emphasized the importance of maximal medical management including strict control of blood pressure, blood glucose, and lipid levels, obtaining regular exercise, and cessation of smoking.  The  patient is aware that without maximal medical management the underlying atherosclerotic disease process will progress, limiting the benefit of any interventions. The patient's surveillance will included ABI and bypass duplex studies which will be completed in: 3 months, at which time the patient will be re-evaluated.   I emphasized the importance of routine surveillance of the patient's bypass, as the vascular surgery literature emphasize the improved patency possible with assisted primary patency procedures versus secondary patency procedures. The patient agrees to participate in their maximal medical care and routine surveillance.  Thank you for allowing [place] to participate in this patient's care.  Leonides Sake, MD [facility] [place] Office: [phone] Pager: [phone]

## 2012-03-14 NOTE — Addendum Note (Signed)
Addended by: Sharee Pimple on: 03/14/2012 12:07 PM   Modules accepted: Orders

## 2012-04-01 ENCOUNTER — Other Ambulatory Visit: Payer: Self-pay | Admitting: Internal Medicine

## 2012-04-01 NOTE — Telephone Encounter (Signed)
Don erx 

## 2012-04-10 ENCOUNTER — Encounter: Payer: Self-pay | Admitting: Vascular Surgery

## 2012-04-11 ENCOUNTER — Ambulatory Visit: Payer: Medicare Other | Admitting: Vascular Surgery

## 2012-04-30 ENCOUNTER — Other Ambulatory Visit: Payer: Self-pay | Admitting: *Deleted

## 2012-04-30 MED ORDER — AMLODIPINE BESYLATE 5 MG PO TABS: 5.0000 mg | ORAL_TABLET | Freq: Every day | ORAL | Status: DC

## 2012-05-02 ENCOUNTER — Ambulatory Visit: Payer: Medicare Other | Admitting: Internal Medicine

## 2012-05-07 ENCOUNTER — Encounter: Payer: Self-pay | Admitting: *Deleted

## 2012-05-12 ENCOUNTER — Other Ambulatory Visit: Payer: Medicare Other

## 2012-05-12 ENCOUNTER — Ambulatory Visit: Payer: Medicare Other | Admitting: Cardiology

## 2012-05-23 ENCOUNTER — Other Ambulatory Visit (INDEPENDENT_AMBULATORY_CARE_PROVIDER_SITE_OTHER): Payer: Medicare Other

## 2012-05-23 ENCOUNTER — Encounter: Payer: Self-pay | Admitting: Internal Medicine

## 2012-05-23 ENCOUNTER — Ambulatory Visit (INDEPENDENT_AMBULATORY_CARE_PROVIDER_SITE_OTHER): Payer: Medicare Other | Admitting: Internal Medicine

## 2012-05-23 VITALS — BP 88/60 | HR 88 | Temp 97.0°F | Ht 66.0 in | Wt 165.1 lb

## 2012-05-23 DIAGNOSIS — I10 Essential (primary) hypertension: Secondary | ICD-10-CM

## 2012-05-23 DIAGNOSIS — E119 Type 2 diabetes mellitus without complications: Secondary | ICD-10-CM

## 2012-05-23 DIAGNOSIS — K219 Gastro-esophageal reflux disease without esophagitis: Secondary | ICD-10-CM

## 2012-05-23 DIAGNOSIS — R42 Dizziness and giddiness: Secondary | ICD-10-CM

## 2012-05-23 LAB — HEPATIC FUNCTION PANEL
ALT: 12 U/L (ref 0–53)
AST: 11 U/L (ref 0–37)
Albumin: 3.7 g/dL (ref 3.5–5.2)
Total Bilirubin: 0.7 mg/dL (ref 0.3–1.2)
Total Protein: 7.9 g/dL (ref 6.0–8.3)

## 2012-05-23 LAB — CBC WITH DIFFERENTIAL/PLATELET
Basophils Relative: 2.1 % (ref 0.0–3.0)
Eosinophils Relative: 2.6 % (ref 0.0–5.0)
HCT: 43.3 % (ref 39.0–52.0)
Hemoglobin: 14.7 g/dL (ref 13.0–17.0)
Lymphs Abs: 1.2 10*3/uL (ref 0.7–4.0)
MCV: 87.5 fl (ref 78.0–100.0)
Monocytes Absolute: 0.6 10*3/uL (ref 0.1–1.0)
Monocytes Relative: 8.3 % (ref 3.0–12.0)
Neutro Abs: 5.6 10*3/uL (ref 1.4–7.7)
Platelets: 366 10*3/uL (ref 150.0–400.0)
WBC: 7.8 10*3/uL (ref 4.5–10.5)

## 2012-05-23 LAB — BASIC METABOLIC PANEL
BUN: 16 mg/dL (ref 6–23)
Chloride: 100 mEq/L (ref 96–112)
GFR: 33.94 mL/min — ABNORMAL LOW (ref >60.00)
Potassium: 4.1 mEq/L (ref 3.5–5.1)
Sodium: 136 mEq/L (ref 135–145)

## 2012-05-23 LAB — LIPID PANEL
Cholesterol: 157 mg/dL (ref 0–200)
VLDL: 42 mg/dL — ABNORMAL HIGH (ref 0.0–40.0)

## 2012-05-23 LAB — HEMOGLOBIN A1C: Hgb A1c MFr Bld: 7.2 % — ABNORMAL HIGH (ref 4.6–6.5)

## 2012-05-23 NOTE — Assessment & Plan Note (Signed)
As above per HTN

## 2012-05-23 NOTE — Assessment & Plan Note (Signed)
Control unclear, for a1c today,  to f/u any worsening symptoms or concerns

## 2012-05-23 NOTE — Patient Instructions (Addendum)
Your EKG was no significant change today Please stop the lisinopril HCT Please continue all other medications as before, and refills have been done if requested. Please drink a bit more fluids over the next week Please go to the LAB in the Basement (turn left off the elevator) for the tests to be done today You will be contacted by phone if any changes need to be made immediately.  Otherwise, you will receive a letter about your results with an explanation, but please check with MyChart first. Thank you for enrolling in MyChart. Please follow the instructions below to securely access your online medical record. MyChart allows you to send messages to your doctor, view your test results, renew your prescriptions, schedule appointments, and more. To Log into My Chart online, please go by [employer] or [employer] to [email or web address], or download the MyChart App from the [employer] of [employer] .  Your Username is: [email or web address] ( please call [phone] to the Mychart Help Desk) to re-do the password Please return in 2 weeks

## 2012-05-23 NOTE — Assessment & Plan Note (Signed)
stable overall by history and exam, recent data reviewed with pt, and pt to continue medical treatment as before,  to f/u any worsening symptoms or concerns Lab Results  Component Value Date   WBC 7.8 05/23/2012   HGB 14.7 05/23/2012   HCT 43.3 05/23/2012   PLT 366.0 05/23/2012   GLUCOSE 204* 05/23/2012   CHOL 157 05/23/2012   TRIG 210.0* 05/23/2012   HDL 32.70* 05/23/2012   LDLDIRECT 107.6 05/23/2012   LDLCALC  Value: UNABLE TO CALCULATE IF TRIGLYCERIDE OVER 400 mg/dL        Total Cholesterol/HDL:CHD Risk Coronary Heart Disease Risk Table                     Men   Women  1/2 Average Risk   3.4   3.3  Average Risk       5.0   4.4  2 X Average Risk   9.6   7.1  3 X Average Risk  23.4   11.0        Use the calculated Patient Ratio above and the CHD Risk Table to determine the patient's CHD Risk.        ATP III CLASSIFICATION (LDL):  <100     mg/dL   Optimal  161-096  mg/dL   Near or Above                    Optimal  130-159  mg/dL   Borderline  045-409  mg/dL   High  >811     mg/dL   Very High 11/17/7827   ALT 12 05/23/2012   AST 11 05/23/2012   NA 136 05/23/2012   K 4.1 05/23/2012   CL 100 05/23/2012   CREATININE 2.1* 05/23/2012   BUN 16 05/23/2012   CO2 27 05/23/2012   TSH 0.765 02/20/2012   INR 1.07 02/18/2012   HGBA1C 7.2* 05/23/2012

## 2012-05-23 NOTE — Progress Notes (Signed)
Subjective:    Patient ID: [name], male    DOB: Feb 27, 1946, 67 y.o.   MRN: [id number]  HPI  Here with wife to f/u, wife states pt with ? recent decreased po intake, with documented wt loss 172 to 165 since last seen for unclear reasons, c/o general weakness, dizziness on current meds;  Pt states/admits that prior to recent surgury he has ongoing noncompliance with meds, but since he has been taking diligently  Pt denies chest pain, increased sob or doe, wheezing, orthopnea, PND, increased LE swelling, palpitations, syncope.  Pt denies new neurological symptoms such as new headache, or facial or extremity weakness or numbness   Pt denies polydipsia, polyuria, or low sugar symptoms such as weakness or confusion improved with po intake.  Pt states overall good compliance with meds, trying to follow lower cholesterol, diabetic diet, not checking cbg's at home. Denies worsening reflux, abd pain, dysphagia, n/v, bowel change or blood. Past Medical History  Diagnosis Date  . Cerebellar stroke, acute   . IHD (ischemic heart disease)   . Hiccoughs   . Diabetes mellitus   . Hyperlipidemia   . Vitamin B12 deficiency   . DIABETES MELLITUS, TYPE II 09/20/2008    Qualifier: Diagnosis of  By: Flonnie Overman    . HYPERTENSION 09/20/2008    Qualifier: Diagnosis of  By: Flonnie Overman    . CAD 09/20/2008    Qualifier: Diagnosis of  By: Flonnie Overman    . BRADYCARDIA 09/20/2008    Qualifier: Diagnosis of  By: Flonnie Overman    . 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 TIA 09/20/2008    Qualifier: Diagnosis of  By: Flonnie Overman    . PSORIASIS 09/20/2008    Qualifier: Diagnosis of  By: Flonnie Overman    . Cardiac pacemaker in situ 10/08/2008    Qualifier: Diagnosis of  By: Ladona Ridgel, MD, Jerrell Mylar   . Benign hypertensive heart disease without heart failure 08/03/2010  . Hx of CABG 08/03/2010  . Intractable hiccoughs 08/03/2010  . Tobacco abuse  08/03/2010  . Vascular claudication 11/15/2010  . Dyslipidemia 09/05/2011  . GERD (gastroesophageal reflux disease) 11/06/2011  . Nephrolithiasis 11/06/2011  . Carotid stenosis 11/06/2011    Bilat 60-80% 2010  . Pacemaker   . Complication of anesthesia     had hallucinations after anesthesia  . Myocardial infarction   . Pneumonia     hx of  . Shortness of breath     ambulation  . CHF (congestive heart failure)   . DVT (deep venous thrombosis)    Past Surgical History  Procedure Laterality Date  . Coronary artery bypass graft    . Coronary angioplasty  09/18/2006    WELL PRESERVED LEFT VENTRICULAR SYSTOLIC FUNCTION. THERE APPEARS TBE SOME HYPOKINESIS OF THE POSTERIOR WALL. EF 60%  . Insert / replace / remove pacemaker    . Femoral-femoral bypass graft  02/25/2012    Procedure: BYPASS GRAFT FEMORAL-FEMORAL ARTERY;  Surgeon: Fransisco Hertz, MD;  Location: [facility] OR;  Service: Vascular;  Laterality: N/A;  RIGHT TO LEFT Using 8mm x 30 cm Hemashield Graft  . Femoral-tibial bypass graft  02/25/2012    Procedure: BYPASS GRAFT FEMORAL-TIBIAL ARTERY;  Surgeon: Fransisco Hertz, MD;  [facility] OR;  Service: Vascular;  Laterality: Left;  . Endarterectomy femoral  02/25/2012    Procedure: ENDARTERECTOMY FEMORAL;  Surgeon: Fransisco Hertz, MD;  Location: [facility] OR;  Service: Vascular;  Laterality: Bilateral;  . Patch angioplasty  02/25/2012    Procedure: PATCH ANGIOPLASTY;  Surgeon: Fransisco Hertz, MD;  Location: [facility] OR;  Service: Vascular;  Laterality: Bilateral;  using  1 cm x 6 cm vascu guard patch angioplasty  . Intraoperative arteriogram  02/25/2012    Procedure: INTRA OPERATIVE ARTERIOGRAM;  Surgeon: Fransisco Hertz, MD;  Location: [facility] OR;  Service: Vascular;  Laterality: Left;    reports that he has been smoking Cigarettes.  He has been smoking about 0.30 packs per day. He quit smokeless tobacco use about 3 months ago. He reports that he does not drink alcohol or use illicit drugs. family history includes COPD in his  mother; Coronary artery disease in his father; Diabetes in his mother; Heart attack in his father and mother; Heart disease in his father and mother; Hyperlipidemia in his father and mother; and Hypertension in his father and mother. Allergies  Allergen Reactions  . Imdur (Isosorbide Mononitrate)     HEADACHES   . Metformin And Related     ABDOMINAL CRAMPS  ' Current Outpatient Prescriptions on File Prior to Visit  Medication Sig Dispense Refill  . amLODipine (NORVASC) 5 MG tablet Take 1 tablet (5 mg total) by mouth daily.  30 tablet  5  . aspirin 81 MG tablet Take 81 mg by mouth daily.        Marland Kitchen atorvastatin (LIPITOR) 80 MG tablet Take 1 tablet (80 mg total) by mouth daily.  90 tablet  3  . baclofen (LIORESAL) 10 MG tablet Take 10 mg by mouth Daily.      . chlorproMAZINE (THORAZINE) 25 MG tablet Take 25 mg by mouth daily.      . chlorproMAZINE (THORAZINE) 25 MG tablet TAKE 1 TABLET BY MOUTH 3 TIMES A DAY  90 tablet  1  . ciprofloxacin (CIPRO) 500 MG tablet Take 1 tablet (500 mg total) by mouth 2 (two) times daily.  6 tablet  0  . dexlansoprazole (DEXILANT) 60 MG capsule Take 1 capsule (60 mg total) by mouth daily.  90 capsule  3  . ezetimibe (ZETIA) 10 MG tablet Take 1 tablet (10 mg total) by mouth daily.  30 tablet  3  . glimepiride (AMARYL) 4 MG tablet Take 1 tablet (4 mg total) by mouth daily before breakfast.  30 tablet  4  . metoprolol succinate (TOPROL-XL) 100 MG 24 hr tablet Take 100 mg by mouth daily. Take with or immediately following a meal.      . nitroGLYCERIN (NITROSTAT) 0.4 MG SL tablet Place 0.4 mg under the tongue every 5 (five) minutes as needed. For chest pain      . oxyCODONE-acetaminophen (PERCOCET/ROXICET) 5-325 MG per tablet Take 1-2 tablets by mouth every 6 (six) hours as needed.  30 tablet  0  . ranitidine (ZANTAC) 150 MG tablet TAKE 1 TABLET BY MOUTH TWICE A DAY  60 tablet  7  . pantoprazole (PROTONIX) 40 MG tablet Take 40 mg by mouth daily.       No current  facility-administered medications on file prior to visit.   Review of Systems  Constitutional: Negative for unexpected weight change, or unusual diaphoresis  HENT: Negative for tinnitus.   Eyes: Negative for photophobia and visual disturbance.  Respiratory: Negative for choking and stridor.   Gastrointestinal: Negative for vomiting and blood in stool.  Genitourinary: Negative for hematuria and decreased urine volume.  Musculoskeletal: Negative for acute joint swelling Skin: Negative for color change and  wound.  Neurological: Negative for tremors and numbness other than noted  Psychiatric/Behavioral: Negative for decreased concentration or  hyperactivity.       Objective:   Physical Exam BP 88/60  Pulse 88  Temp(Src) 97 F (36.1 C) (Oral)  Ht 5\' 6"  (1.676 m)  Wt 165 lb 2 oz (74.9 kg)  BMI 26.66 kg/m2  SpO2 97% VS noted, orthostatic BP's noted Constitutional: Pt appears well-developed and well-nourished.  HENT: Head: NCAT.  Right Ear: External ear normal.  Left Ear: External ear normal.  Eyes: Conjunctivae and EOM are normal. Pupils are equal, round, and reactive to light.  Neck: Normal range of motion. Neck supple.  Cardiovascular: Normal rate and regular rhythm.   Pulmonary/Chest: Effort normal and breath sounds normal.  Abd:  Soft, NT, non-distended, + BS Neurological: Pt is alert. Not confused  Skin: Skin is warm. No erythema.  Psychiatric: Pt behavior is normal. Thought content normal.     Assessment & Plan:

## 2012-05-23 NOTE — Assessment & Plan Note (Addendum)
ECG reviewed as per emr, but prob overtx given symptoms and orthostasis today, to d/c the lisinopril-HCT, f/u 2 wk, consider re-start lower dose ACE in light of DM

## 2012-06-06 ENCOUNTER — Ambulatory Visit: Payer: Medicare Other | Admitting: Internal Medicine

## 2012-06-06 DIAGNOSIS — Z0289 Encounter for other administrative examinations: Secondary | ICD-10-CM

## 2012-06-12 ENCOUNTER — Encounter: Payer: Self-pay | Admitting: Vascular Surgery

## 2012-06-13 ENCOUNTER — Ambulatory Visit: Payer: Medicare Other | Admitting: Vascular Surgery

## 2012-06-20 ENCOUNTER — Encounter: Payer: Self-pay | Admitting: Cardiology

## 2012-07-09 ENCOUNTER — Telehealth: Payer: Self-pay | Admitting: Cardiology

## 2012-07-09 NOTE — Telephone Encounter (Signed)
New problem    C/O SOB.

## 2012-07-09 NOTE — Telephone Encounter (Signed)
Patient having shortness of breath and chest discomfort but refuses to go to ED. Scheduled appointment for Friday, did advise if worse Junious Dresser (friend) if worse he needed to go to ED

## 2012-07-11 ENCOUNTER — Ambulatory Visit (INDEPENDENT_AMBULATORY_CARE_PROVIDER_SITE_OTHER): Payer: Medicare Other | Admitting: Cardiology

## 2012-07-11 ENCOUNTER — Encounter: Payer: Self-pay | Admitting: Cardiology

## 2012-07-11 VITALS — BP 132/72 | HR 87 | Ht 66.0 in | Wt 169.4 lb

## 2012-07-11 DIAGNOSIS — G459 Transient cerebral ischemic attack, unspecified: Secondary | ICD-10-CM

## 2012-07-11 DIAGNOSIS — E7800 Pure hypercholesterolemia, unspecified: Secondary | ICD-10-CM

## 2012-07-11 DIAGNOSIS — I119 Hypertensive heart disease without heart failure: Secondary | ICD-10-CM

## 2012-07-11 DIAGNOSIS — F17200 Nicotine dependence, unspecified, uncomplicated: Secondary | ICD-10-CM

## 2012-07-11 DIAGNOSIS — R0600 Dyspnea, unspecified: Secondary | ICD-10-CM

## 2012-07-11 DIAGNOSIS — I259 Chronic ischemic heart disease, unspecified: Secondary | ICD-10-CM

## 2012-07-11 DIAGNOSIS — Z720 Tobacco use: Secondary | ICD-10-CM

## 2012-07-11 DIAGNOSIS — R0989 Other specified symptoms and signs involving the circulatory and respiratory systems: Secondary | ICD-10-CM

## 2012-07-11 NOTE — Patient Instructions (Addendum)
OK TO TRY NICODERM PATCH  Your physician recommends that you continue on your current medications as directed. Please refer to the Current Medication list given to you today.  Your physician recommends that you schedule a follow-up appointment in: 4 months with fasting labs (lp/bmet/hfp)

## 2012-07-11 NOTE — Assessment & Plan Note (Signed)
The patient remains on a daily baby aspirin.  He has had no recurrent TIA symptoms.

## 2012-07-11 NOTE — Progress Notes (Signed)
[name] Date of Birth:  1945-06-11 [facility] [address] [place], [place]  [address] [phone]         Fax   [phone]  History of Present Illness: This pleasant 67 year old gentleman is seen for a scheduled followup office visit. He has a past history of ischemic heart disease. He underwent coronary artery bypass graft surgery on 05/21/08. He has had prior strokes. 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 He's had long-standing high blood pressure. He has claudication of the legs and peripheral arterial occlusive disease and he continues to smoke a half to a full pack of cigarettes a day against advice. He has a history of sick sinus syndrome and has a functioning pacemaker.  3 days ago he was noticing some increased shortness of breath and had some brief left-sided chest discomfort.  Both of those symptoms have now resolved without specific therapy  Current Outpatient Prescriptions  Medication Sig Dispense Refill  . amLODipine (NORVASC) 5 MG tablet Take 1 tablet (5 mg total) by mouth daily.  30 tablet  5  . aspirin 81 MG tablet Take 81 mg by mouth daily.        Marland Kitchen atorvastatin (LIPITOR) 80 MG tablet Take 1 tablet (80 mg total) by mouth daily.  90 tablet  3  . baclofen (LIORESAL) 10 MG tablet Take 10 mg by mouth Daily.      . chlorproMAZINE (THORAZINE) 25 MG tablet TAKE 1 TABLET BY MOUTH 3 TIMES A DAY  90 tablet  1  . ciprofloxacin (CIPRO) 500 MG tablet Take 1 tablet (500 mg total) by mouth 2 (two) times daily.  6 tablet  0  . dexlansoprazole (DEXILANT) 60 MG capsule Take 1 capsule (60 mg total) by mouth daily.  90 capsule  3  . ezetimibe (ZETIA) 10 MG tablet Take 1 tablet (10 mg total) by mouth daily.  30 tablet  3  . glimepiride (AMARYL) 4 MG tablet Take 1 tablet (4 mg total) by mouth daily before breakfast.  30 tablet  4  . metoprolol succinate (TOPROL-XL) 100 MG 24 hr tablet Take 100  mg by mouth daily. Take with or immediately following a meal.      . nitroGLYCERIN (NITROSTAT) 0.4 MG SL tablet Place 0.4 mg under the tongue every 5 (five) minutes as needed. For chest pain      . pantoprazole (PROTONIX) 40 MG tablet Take 40 mg by mouth daily.      . ranitidine (ZANTAC) 150 MG tablet TAKE 1 TABLET BY MOUTH TWICE A DAY  60 tablet  7  . oxyCODONE-acetaminophen (PERCOCET/ROXICET) 5-325 MG per tablet Take 1-2 tablets by mouth every 6 (six) hours as needed.  30 tablet  0   No current facility-administered medications for this visit.    Allergies  Allergen Reactions  . Imdur (Isosorbide Mononitrate)     HEADACHES   . Metformin And Related     ABDOMINAL CRAMPS    Patient Active Problem List   Diagnosis Date Noted  . Benign hypertensive heart disease without heart failure 08/03/2010    Priority: High  . Hx of CABG 08/03/2010    Priority: Medium  . Intractable hiccoughs 08/03/2010    Priority: Medium  . Dizziness and giddiness 05/23/2012  . Peripheral vascular disease, unspecified 03/14/2012  . Altered mental status 02/20/2012  . Hallucinations 02/20/2012  . Delirium 02/20/2012  . Chronic kidney disease, stage 3 02/19/2012  .  Ischemic leg 02/18/2012  . Ischemic heart disease 01/08/2012  . Preventative health care 11/06/2011  . GERD (gastroesophageal reflux disease) 11/06/2011  . Nephrolithiasis 11/06/2011  . Carotid stenosis 11/06/2011  . Dyslipidemia 09/05/2011  . Vascular claudication 11/15/2010  . Pacemaker 09/05/2010  . Tobacco abuse 08/03/2010  . Cardiac pacemaker in situ 10/08/2008  . DIABETES MELLITUS, TYPE II 09/20/2008  . HYPERTENSION 09/20/2008  . CAD 09/20/2008  . BRADYCARDIA 09/20/2008  . 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 TIA 09/20/2008  . PSORIASIS 09/20/2008    History  Smoking status  . Current Every Day Smoker -- 0.30 packs/day  . Types: Cigarettes  Smokeless tobacco  . Former Neurosurgeon  . Quit date: 02/18/2012    History    Alcohol Use No    Family History  Problem Relation Age of Onset  . COPD Mother   . Diabetes Mother   . Heart disease Mother   . Hyperlipidemia Mother   . Hypertension Mother   . Heart attack Mother   . Coronary artery disease Father   . Heart disease Father     before age 39  . Hyperlipidemia Father   . Hypertension Father   . Heart attack Father     Review of Systems: Constitutional: no fever chills diaphoresis or fatigue or change in weight.  Head and neck: no hearing loss, no epistaxis, no photophobia or visual disturbance. Respiratory: No cough, shortness of breath or wheezing. Cardiovascular: No chest pain peripheral edema, palpitations. Gastrointestinal: No abdominal distention, no abdominal pain, no change in bowel habits hematochezia or melena. Genitourinary: No dysuria, no frequency, no urgency, no nocturia. Musculoskeletal:No arthralgias, no back pain, no gait disturbance or myalgias. Neurological: No dizziness, no headaches, no numbness, no seizures, no syncope, no weakness, no tremors. Hematologic: No lymphadenopathy, no easy bruising. Psychiatric: No confusion, no hallucinations, no sleep disturbance.    Physical Exam: Filed Vitals:   07/11/12 1019  BP: 132/72  Pulse: 87   the general appearance reveals a well-developed elderly gentleman in no distress.The head and neck exam reveals pupils equal and reactive.  Extraocular movements are full.  There is no scleral icterus.  The mouth and pharynx are normal.  The neck is supple.  The carotids reveal no bruits.  The jugular venous pressure is normal.  The  thyroid is not enlarged.  There is no lymphadenopathy.  The chest is clear to percussion and auscultation.  There are no rales or rhonchi.  Expansion of the chest is symmetrical.  The precordium is quiet.  The first heart sound is normal.  The second heart sound is physiologically split.  There is no murmur gallop rub or click.  There is no abnormal lift or heave.   The abdomen is soft and nontender.  The bowel sounds are normal.  The liver and spleen are not enlarged.  There are no abdominal masses.  There are no abdominal bruits.  Extremities reveal weak pedal pulses.  I can feel a soft right posterior tibial pulse.  The others are equivocal. There is no phlebitis or edema.  There is no cyanosis or clubbing.  Strength is normal and symmetrical in all extremities.  There is no lateralizing weakness.  There are no sensory deficits.  The skin is warm and dry.  There is no rash.  EKG today shows normal sinus rhythm and LVH with secondary ST-T wave change.  The changes are unchanged since 05/23/12.  Pacemaker spikes are appropriately inhibited and not seen on this tracing because  his heart rate is 87 per minute.  Assessment / Plan: Continue same medication.  Try to quit smoking finally.  Recheck in 4 months for followup office visit lipid panel hepatic function panel and basal metabolic panel.

## 2012-07-11 NOTE — Assessment & Plan Note (Signed)
Patient still smokes a half a pack of cigarettes a day.  He will try some Nicoderm patches over-the-counter.

## 2012-07-11 NOTE — Assessment & Plan Note (Signed)
Blood pressure was remaining stable on current therapy 

## 2012-07-11 NOTE — Assessment & Plan Note (Signed)
His recent chest pain was sharp and does not sound ischemic.  He has not been experiencing any definite recurrent angina pectoris.

## 2012-08-08 ENCOUNTER — Ambulatory Visit: Payer: Medicare Other | Admitting: Internal Medicine

## 2012-08-08 DIAGNOSIS — Z0289 Encounter for other administrative examinations: Secondary | ICD-10-CM

## 2012-09-09 ENCOUNTER — Telehealth: Payer: Self-pay | Admitting: Cardiology

## 2012-09-09 NOTE — Telephone Encounter (Signed)
New problem  Per connie pt is not feeling well. She wants to know if pt can come in and have some blood work done to see if they can figure out what is going on.

## 2012-09-09 NOTE — Telephone Encounter (Signed)
Spoke with [name] and she states he is just not feeling well and wanted to bring him in for labs. Patient is not having any specific symptoms so advised to call PCP, [name] verbalized understanding.

## 2012-09-11 ENCOUNTER — Encounter: Payer: Self-pay | Admitting: Internal Medicine

## 2012-09-11 ENCOUNTER — Other Ambulatory Visit (INDEPENDENT_AMBULATORY_CARE_PROVIDER_SITE_OTHER): Payer: Medicare Other

## 2012-09-11 ENCOUNTER — Ambulatory Visit (INDEPENDENT_AMBULATORY_CARE_PROVIDER_SITE_OTHER): Payer: Medicare Other | Admitting: Internal Medicine

## 2012-09-11 VITALS — BP 132/82 | HR 85 | Temp 98.6°F | Wt 175.2 lb

## 2012-09-11 DIAGNOSIS — E119 Type 2 diabetes mellitus without complications: Secondary | ICD-10-CM

## 2012-09-11 DIAGNOSIS — I10 Essential (primary) hypertension: Secondary | ICD-10-CM

## 2012-09-11 DIAGNOSIS — N183 Chronic kidney disease, stage 3 unspecified: Secondary | ICD-10-CM

## 2012-09-11 DIAGNOSIS — G4710 Hypersomnia, unspecified: Secondary | ICD-10-CM

## 2012-09-11 DIAGNOSIS — N320 Bladder-neck obstruction: Secondary | ICD-10-CM

## 2012-09-11 DIAGNOSIS — M79609 Pain in unspecified limb: Secondary | ICD-10-CM

## 2012-09-11 DIAGNOSIS — E785 Hyperlipidemia, unspecified: Secondary | ICD-10-CM

## 2012-09-11 DIAGNOSIS — M79601 Pain in right arm: Secondary | ICD-10-CM

## 2012-09-11 LAB — CBC WITH DIFFERENTIAL/PLATELET
Basophils Relative: 1.9 % (ref 0.0–3.0)
Eosinophils Relative: 1.9 % (ref 0.0–5.0)
Hemoglobin: 14.6 g/dL (ref 13.0–17.0)
Lymphocytes Relative: 20.1 % (ref 12.0–46.0)
MCHC: 33.9 g/dL (ref 30.0–36.0)
MCV: 88.3 fl (ref 78.0–100.0)
Monocytes Absolute: 0.8 10*3/uL (ref 0.1–1.0)
Neutro Abs: 4.7 10*3/uL (ref 1.4–7.7)
Neutrophils Relative %: 64.5 % (ref 43.0–77.0)
RBC: 4.87 Mil/uL (ref 4.22–5.81)
WBC: 7.2 10*3/uL (ref 4.5–10.5)

## 2012-09-11 LAB — BASIC METABOLIC PANEL
BUN: 9 mg/dL (ref 6–23)
Calcium: 8.9 mg/dL (ref 8.4–10.5)
Chloride: 103 mEq/L (ref 96–112)
Creatinine, Ser: 1.5 mg/dL (ref 0.4–1.5)

## 2012-09-11 LAB — HEPATIC FUNCTION PANEL
ALT: 8 U/L (ref 0–53)
Bilirubin, Direct: 0.1 mg/dL (ref 0.0–0.3)
Total Bilirubin: 0.7 mg/dL (ref 0.3–1.2)

## 2012-09-11 LAB — URINALYSIS, ROUTINE W REFLEX MICROSCOPIC
Nitrite: NEGATIVE
Total Protein, Urine: 30
Urine Glucose: NEGATIVE
pH: 6 (ref 5.0–8.0)

## 2012-09-11 LAB — LDL CHOLESTEROL, DIRECT: Direct LDL: 155 mg/dL

## 2012-09-11 LAB — LIPID PANEL
Cholesterol: 210 mg/dL — ABNORMAL HIGH (ref 0–200)
HDL: 38.2 mg/dL — ABNORMAL LOW (ref >39.00)
Total CHOL/HDL Ratio: 5
Triglycerides: 205 mg/dL — ABNORMAL HIGH (ref 0.0–149.0)
VLDL: 41 mg/dL — ABNORMAL HIGH (ref 0.0–40.0)

## 2012-09-11 LAB — PSA: PSA: 1.84 ng/mL (ref 0.10–4.00)

## 2012-09-11 LAB — HEMOGLOBIN A1C: Hgb A1c MFr Bld: 7.1 % — ABNORMAL HIGH (ref 4.6–6.5)

## 2012-09-11 NOTE — Assessment & Plan Note (Addendum)
?   Bicep pain/tendonitis vs right shoulder referred pain such as with DJD, exam ok today, consider film shoulder but for now tylenol prn,  to f/u any worsening symptoms or concerns  Note:  Total time for pt hx, exam, review of record with pt in the room, determination of diagnoses and plan for further eval and tx is > 40 min, with over 50% spent in coordination and counseling of patient

## 2012-09-11 NOTE — Assessment & Plan Note (Signed)
Lab Results  Component Value Date   CREATININE 1.5 09/11/2012   stable overall by history and exam, recent data reviewed with pt, and pt to continue medical treatment as before,  to f/u any worsening symptoms or concerns

## 2012-09-11 NOTE — Assessment & Plan Note (Signed)
stable overall by history and exam, recent data reviewed with pt, and pt to continue medical treatment as before,  to f/u any worsening symptoms or concerns BP Readings from Last 3 Encounters:  09/11/12 132/82  07/11/12 132/72  05/23/12 88/60

## 2012-09-11 NOTE — Patient Instructions (Addendum)
Please continue all other medications as before, and refills have been done if requested. Please continue your efforts at being more active, low cholesterol diet, and weight control. Please keep your appointments with your specialists as you have planned You will be contacted regarding the referral for: pulmonary Please go to the LAB in the Basement (turn left off the elevator) for the tests to be done today You will be contacted by phone if any changes need to be made immediately.  Otherwise, you will receive a letter about your results with an explanation, but please check with MyChart first.  Please remember to sign up for My Chart if you have not done so, as this will be important to you in the future with finding out test results, communicating by private email, and scheduling acute appointments online when needed.

## 2012-09-11 NOTE — Assessment & Plan Note (Signed)
?   Poor sleep hygeine vs osa - for pulm referral

## 2012-09-11 NOTE — Assessment & Plan Note (Signed)
stable overall by history and exam, recent data reviewed with pt, and pt to continue medical treatment as before,  to f/u any worsening symptoms or concerns Lab Results  Component Value Date   HGBA1C 7.1* 09/11/2012

## 2012-09-11 NOTE — Progress Notes (Addendum)
Subjective:    Patient ID: [name], male    DOB: November 15, 1945, 67 y.o.   MRN: [id number]  HPI here with woman who lives with him and sleeps in another room, c/o right mid arm bicep pain, tender mild intermittent for several days, worse to flex at the elbow, without neck pain or radicular symptoms, numbness or weakness, no rash or other skin change, swelling.  Does c/o ongoing fatigue, but has also signficant daytime hypersomnolence, non restorative sleep, has to catnap most days, not sure about snoring.   Pt denies fever, wt loss, night sweats, loss of appetite, or other constitutional symptoms  Has known CKD.  Denies worsening depressive symptoms, suicidal ideation, or panic.  Pt denies chest pain, increased sob or doe, wheezing, orthopnea, PND, increased LE swelling, palpitations, dizziness or syncope. Past Medical History  Diagnosis Date  . Cerebellar stroke, acute   . IHD (ischemic heart disease)   . Hiccoughs   . Diabetes mellitus   . Hyperlipidemia   . Vitamin B12 deficiency   . DIABETES MELLITUS, TYPE II 09/20/2008    Qualifier: Diagnosis of  By: Flonnie Overman    . HYPERTENSION 09/20/2008    Qualifier: Diagnosis of  By: Flonnie Overman    . CAD 09/20/2008    Qualifier: Diagnosis of  By: Flonnie Overman    . BRADYCARDIA 09/20/2008    Qualifier: Diagnosis of  By: Flonnie Overman    . 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 TIA 09/20/2008    Qualifier: Diagnosis of  By: Flonnie Overman    . PSORIASIS 09/20/2008    Qualifier: Diagnosis of  By: Flonnie Overman    . Cardiac pacemaker in situ 10/08/2008    Qualifier: Diagnosis of  By: Ladona Ridgel, MD, Jerrell Mylar   . Benign hypertensive heart disease without heart failure 08/03/2010  . Hx of CABG 08/03/2010  . Intractable hiccoughs 08/03/2010  . Tobacco abuse 08/03/2010  . Vascular claudication 11/15/2010  . Dyslipidemia 09/05/2011  . GERD (gastroesophageal reflux disease) 11/06/2011  .  Nephrolithiasis 11/06/2011  . Carotid stenosis 11/06/2011    Bilat 60-80% 2010  . Pacemaker   . Complication of anesthesia     had hallucinations after anesthesia  . Myocardial infarction   . Pneumonia     hx of  . Shortness of breath     ambulation  . CHF (congestive heart failure)   . DVT (deep venous thrombosis)    Past Surgical History  Procedure Laterality Date  . Coronary artery bypass graft    . Coronary angioplasty  09/18/2006    WELL PRESERVED LEFT VENTRICULAR SYSTOLIC FUNCTION. THERE APPEARS TBE SOME HYPOKINESIS OF THE POSTERIOR WALL. EF 60%  . Insert / replace / remove pacemaker    . Femoral-femoral bypass graft  02/25/2012    Procedure: BYPASS GRAFT FEMORAL-FEMORAL ARTERY;  Surgeon: Fransisco Hertz, MD;  Location: [facility] OR;  Service: Vascular;  Laterality: N/A;  RIGHT TO LEFT Using 8mm x 30 cm Hemashield Graft  . Femoral-tibial bypass graft  02/25/2012    Procedure: BYPASS GRAFT FEMORAL-TIBIAL ARTERY;  Surgeon: Fransisco Hertz, MD;  Location: [facility] OR;  Service: Vascular;  Laterality: Left;  . Endarterectomy femoral  02/25/2012    Procedure: ENDARTERECTOMY FEMORAL;  Surgeon: Fransisco Hertz, MD;  Location: [facility] OR;  Service: Vascular;  Laterality: Bilateral;  . Patch angioplasty  02/25/2012    Procedure: PATCH ANGIOPLASTY;  Surgeon: Fransisco Hertz,  MD;  Location: [facility] OR;  Service: Vascular;  Laterality: Bilateral;  using  1 cm x 6 cm vascu guard patch angioplasty  . Intraoperative arteriogram  02/25/2012    Procedure: INTRA OPERATIVE ARTERIOGRAM;  Surgeon: Fransisco Hertz, MD;  Location: [facility] OR;  Service: Vascular;  Laterality: Left;    reports that he has been smoking Cigarettes.  He has been smoking about 0.30 packs per day. He quit smokeless tobacco use about 6 months ago. He reports that he does not drink alcohol or use illicit drugs. family history includes COPD in his mother; Coronary artery disease in his father; Diabetes in his mother; Heart attack in his father and mother; Heart disease in  his father and mother; Hyperlipidemia in his father and mother; and Hypertension in his father and mother. Allergies  Allergen Reactions  . Imdur (Isosorbide Mononitrate)     HEADACHES   . Metformin And Related     ABDOMINAL CRAMPS   Current Outpatient Prescriptions on File Prior to Visit  Medication Sig Dispense Refill  . amLODipine (NORVASC) 5 MG tablet Take 1 tablet (5 mg total) by mouth daily.  30 tablet  5  . aspirin 81 MG tablet Take 81 mg by mouth daily.        Marland Kitchen atorvastatin (LIPITOR) 80 MG tablet Take 1 tablet (80 mg total) by mouth daily.  90 tablet  3  . baclofen (LIORESAL) 10 MG tablet Take 10 mg by mouth Daily.      . chlorproMAZINE (THORAZINE) 25 MG tablet TAKE 1 TABLET BY MOUTH 3 TIMES A DAY  90 tablet  1  . ciprofloxacin (CIPRO) 500 MG tablet Take 1 tablet (500 mg total) by mouth 2 (two) times daily.  6 tablet  0  . dexlansoprazole (DEXILANT) 60 MG capsule Take 1 capsule (60 mg total) by mouth daily.  90 capsule  3  . ezetimibe (ZETIA) 10 MG tablet Take 1 tablet (10 mg total) by mouth daily.  30 tablet  3  . glimepiride (AMARYL) 4 MG tablet Take 1 tablet (4 mg total) by mouth daily before breakfast.  30 tablet  4  . metoprolol succinate (TOPROL-XL) 100 MG 24 hr tablet Take 100 mg by mouth daily. Take with or immediately following a meal.      . nitroGLYCERIN (NITROSTAT) 0.4 MG SL tablet Place 0.4 mg under the tongue every 5 (five) minutes as needed. For chest pain      . oxyCODONE-acetaminophen (PERCOCET/ROXICET) 5-325 MG per tablet Take 1-2 tablets by mouth every 6 (six) hours as needed.  30 tablet  0  . pantoprazole (PROTONIX) 40 MG tablet Take 40 mg by mouth daily.      . ranitidine (ZANTAC) 150 MG tablet TAKE 1 TABLET BY MOUTH TWICE A DAY  60 tablet  7   No current facility-administered medications on file prior to visit.   Review of Systems  Constitutional: Negative for unexpected weight change, or unusual diaphoresis  HENT: Negative for tinnitus.   Eyes: Negative  for photophobia and visual disturbance.  Respiratory: Negative for choking and stridor.   Gastrointestinal: Negative for vomiting and blood in stool.  Genitourinary: Negative for hematuria and decreased urine volume.  Musculoskeletal: Negative for acute joint swelling Skin: Negative for color change and wound.  Neurological: Negative for tremors and numbness other than noted  Psychiatric/Behavioral: Negative for decreased concentration or  hyperactivity.       Objective:   Physical Exam BP 132/82  Pulse 85  Temp(Src) 98.6  F (37 C) (Oral)  Wt 175 lb 4 oz (79.493 kg)  BMI 28.3 kg/m2  SpO2 98% VS noted,  Constitutional: Pt appears well-developed and well-nourished.  HENT: Head: NCAT.  Right Ear: External ear normal.  Left Ear: External ear normal.  Eyes: Conjunctivae and EOM are normal. Pupils are equal, round, and reactive to light.  Neck: Normal range of motion. Neck supple. NT, FROM, no masses Cardiovascular: Normal rate and regular rhythm.   Pulmonary/Chest: Effort normal and breath sounds normal.  Abd:  Soft, NT, non-distended, + BS Right shoulder mild decrased ROM to int rotation o/w NT shoulder/upper arm, no rash, swelling or bicep tenderness - RUE o/w neurovasc intact Neurological: Pt is alert. Not confused , motor 5/5 Skin: Skin is warm. No erythema. No rash Psychiatric: Pt behavior is normal. Thought content normal.     Assessment & Plan:  Quality Measures addressed:  Colorectal Cancer screening: pt declines all at this time, including FOBT, flex sig, colonoscopy  Diabetes LDL < 100: pt declines further medication  Diabetes Aspirin use: pt declines further medication Diabetes Tobacco non-use: pt declines to quit smoking

## 2012-09-11 NOTE — Assessment & Plan Note (Signed)
stable overall by history and exam, recent data reviewed with pt, and pt to continue medical treatment as before,  to f/u any worsening symptoms or concerns Lab Results  Component Value Date   LDLCALC  Value: UNABLE TO CALCULATE IF TRIGLYCERIDE OVER 400 mg/dL        Total Cholesterol/HDL:CHD Risk Coronary Heart Disease Risk Table                     Men   Women  1/2 Average Risk   3.4   3.3  Average Risk       5.0   4.4  2 X Average Risk   9.6   7.1  3 X Average Risk  23.4   11.0        Use the calculated Patient Ratio above and the CHD Risk Table to determine the patient's CHD Risk.        ATP III CLASSIFICATION (LDL):  <100     mg/dL   Optimal  811-914  mg/dL   Near or Above                    Optimal  130-159  mg/dL   Borderline  782-956  mg/dL   High  >213     mg/dL   Very High 0/86/5784   For lipid f/u

## 2012-09-19 ENCOUNTER — Telehealth: Payer: Self-pay | Admitting: Internal Medicine

## 2012-09-19 NOTE — Telephone Encounter (Signed)
Called left msg. To call back 

## 2012-09-19 NOTE — Telephone Encounter (Signed)
Pt called stated that never got the lab result that was on 09/11/12. Please call pt

## 2012-09-19 NOTE — Telephone Encounter (Signed)
Called the patient informed of lab results. 

## 2012-10-21 ENCOUNTER — Ambulatory Visit (INDEPENDENT_AMBULATORY_CARE_PROVIDER_SITE_OTHER): Payer: Medicare Other | Admitting: Pulmonary Disease

## 2012-10-21 ENCOUNTER — Encounter: Payer: Self-pay | Admitting: Pulmonary Disease

## 2012-10-21 ENCOUNTER — Ambulatory Visit (INDEPENDENT_AMBULATORY_CARE_PROVIDER_SITE_OTHER)
Admission: RE | Admit: 2012-10-21 | Discharge: 2012-10-21 | Disposition: A | Discharge status: Home / Self Care | Payer: Medicare Other | Source: Ambulatory Visit | Attending: Pulmonary Disease | Admitting: Pulmonary Disease

## 2012-10-21 VITALS — BP 158/82 | HR 66 | Temp 97.3°F | Ht 66.0 in | Wt 176.0 lb

## 2012-10-21 DIAGNOSIS — R0601 Orthopnea: Secondary | ICD-10-CM

## 2012-10-21 NOTE — Patient Instructions (Addendum)
Will check a cxr today, and let you know the results. Will schedule for breathing studies at [facility] long asap, and will discuss with you. Will get cardiac records from Dr. Patty Sermons. Will arrange followup with me once these are done.

## 2012-10-21 NOTE — Progress Notes (Signed)
  Subjective:    Patient ID: [name], male    DOB: Dec 15, 1945, 67 y.o.   MRN: [id number]  HPI  The patient is a 67 year old male who I've been asked to see for severe orthopnea.  The patient has a long history of tobacco abuse, and describes chronic dyspnea on exertion since his bypass surgery in the past.  Over the last 2-3 weeks, he has developed significant orthopnea anytime he lies down flat, and this can persist even if on 2 pillows.  This occurs during the day or night, and keeps him from being able to get to sleep without being upright.  He feels that he breathes completely normal at rest if he is sitting up.  The patient has no history of diaphragm dysfunction, and denies any recent trauma.  He has a chronic cough from his smoking, but produces very little mucus.  He has not had a recent chest x-ray.  He tells me that he can get winded bringing groceries in from the car, walking up one flight of stairs, or walking at a moderate pace for less than one block he also thinks his dyspnea on exertion is worse the last 2-3 weeks as well.  The patient has an extensive cardiac history, but I do not see a recent echocardiogram or stress test.  Review of Systems  Constitutional: Negative for fever and unexpected weight change.  HENT: Negative for ear pain, nosebleeds, congestion, sore throat, rhinorrhea, sneezing, trouble swallowing, dental problem, postnasal drip and sinus pressure.   Eyes: Negative for redness and itching.  Respiratory: Positive for cough and shortness of breath. Negative for chest tightness and wheezing.   Cardiovascular: Positive for chest pain. Negative for palpitations and leg swelling.  Gastrointestinal: Negative for nausea and vomiting.  Genitourinary: Negative for dysuria.  Musculoskeletal: Negative for joint swelling.  Skin: Negative for rash.  Neurological: Negative for headaches.  Hematological: Does not bruise/bleed easily.  Psychiatric/Behavioral: Negative for  dysphoric mood. The patient is not nervous/anxious.        Objective:   Physical Exam Constitutional:  Well developed, no acute distress  HENT:  Nares patent without discharge, but narrowed with turbinate hypertrophy  Oropharynx without exudate, palate and uvula are thick and elongated.   Eyes:  Perrla, eomi, no scleral icterus  Neck:  No JVD, no TMG  Cardiovascular:  Normal rate, regular rhythm, no rubs or gallops.  2/6 sem        Intact distal pulses  Pulmonary :  Normal breath sounds, no stridor or respiratory distress   No rales, rhonchi, or wheezing  Abdominal:  Soft, nondistended, bowel sounds present.  No tenderness noted.   Musculoskeletal:  No lower extremity edema noted.  Lymph Nodes:  No cervical lymphadenopathy noted  Skin:  No cyanosis noted  Neurologic:  Alert, appropriate, moves all 4 extremities without obvious deficit.         Assessment & Plan:

## 2012-10-21 NOTE — Addendum Note (Signed)
Addended by: Maisie Fus on: 10/21/2012 04:14 PM   Modules accepted: Orders

## 2012-10-21 NOTE — Assessment & Plan Note (Signed)
The patient describes severe orthopnea anytime he lies down, day or night.  He believes this has occurred acutely over the last 2-3 weeks, and it is unclear if this is a diaphragm or neuromuscular issue vs possibly related to left ventricular dysfunction.  His lungs are totally clear today with no evidence for bronchospasm, and therefore I do not think it is related to his COPD.  Although, I suspect he does have significant COPD from his long-standing smoking.  At this point, I would like to check a chest x-ray, and also scheduled for pulmonary function studies with muscle pressures ASAP.  I suspect he will also need an echocardiogram for evaluation of left ventricular dysfunction.

## 2012-10-24 ENCOUNTER — Inpatient Hospital Stay (HOSPITAL_COMMUNITY): Admission: RE | Admit: 2012-10-24 | Payer: Medicare Other | Source: Ambulatory Visit

## 2012-10-30 ENCOUNTER — Other Ambulatory Visit: Payer: Self-pay | Admitting: Pulmonary Disease

## 2012-10-30 ENCOUNTER — Telehealth: Payer: Self-pay | Admitting: Pulmonary Disease

## 2012-10-30 DIAGNOSIS — R0601 Orthopnea: Secondary | ICD-10-CM

## 2012-10-30 NOTE — Telephone Encounter (Signed)
Caregiver called back. Also asks that nurse try calling pt's # [phone] as well. [name]

## 2012-10-30 NOTE — Telephone Encounter (Signed)
I spoke with pt care giver. She and pt both agree'd for the echo. Dr. Shelle Iron do you want this w/ or w/o contrast please advise thanks

## 2012-10-30 NOTE — Telephone Encounter (Signed)
Notes Recorded by Barbaraann Share, MD on 10/21/2012 at 5:39 PM Please let pt know that his cxr is suggestive of fluid on the lungs, and therefore congestive heart failure. Would like to schedule him for echo if he is ok with this ---  lmomtcb x1 for caregivier

## 2012-11-04 ENCOUNTER — Ambulatory Visit (HOSPITAL_COMMUNITY)
Admission: RE | Admit: 2012-11-04 | Discharge: 2012-11-04 | Disposition: A | Discharge status: Home / Self Care | Payer: Medicare Other | Source: Ambulatory Visit | Attending: Pulmonary Disease | Admitting: Pulmonary Disease

## 2012-11-04 DIAGNOSIS — J988 Other specified respiratory disorders: Secondary | ICD-10-CM | POA: Insufficient documentation

## 2012-11-04 DIAGNOSIS — R0601 Orthopnea: Secondary | ICD-10-CM

## 2012-11-04 LAB — PULMONARY FUNCTION TEST

## 2012-11-04 MED ORDER — ALBUTEROL SULFATE (5 MG/ML) 0.5% IN NEBU
2.5000 mg | INHALATION_SOLUTION | Freq: Once | RESPIRATORY_TRACT | Status: AC
  Administered 2012-11-04: 2.5 mg via RESPIRATORY_TRACT

## 2012-11-05 NOTE — Telephone Encounter (Signed)
Needs echo without contrast.  Thanks.

## 2012-11-05 NOTE — Telephone Encounter (Signed)
[facility] has ordered this already. Will forward to P[facility]'s to ensure this was received

## 2012-11-06 NOTE — Telephone Encounter (Signed)
Echo scheduled 11/07/12@9 :30am@[facility]  [name]

## 2012-11-07 ENCOUNTER — Ambulatory Visit (HOSPITAL_COMMUNITY): Payer: Medicare Other | Attending: Pulmonary Disease

## 2012-11-07 DIAGNOSIS — I059 Rheumatic mitral valve disease, unspecified: Secondary | ICD-10-CM | POA: Insufficient documentation

## 2012-11-07 DIAGNOSIS — R0600 Dyspnea, unspecified: Secondary | ICD-10-CM

## 2012-11-07 DIAGNOSIS — I129 Hypertensive chronic kidney disease with stage 1 through stage 4 chronic kidney disease, or unspecified chronic kidney disease: Secondary | ICD-10-CM | POA: Insufficient documentation

## 2012-11-07 DIAGNOSIS — R0602 Shortness of breath: Secondary | ICD-10-CM

## 2012-11-07 DIAGNOSIS — E119 Type 2 diabetes mellitus without complications: Secondary | ICD-10-CM | POA: Insufficient documentation

## 2012-11-07 DIAGNOSIS — F17200 Nicotine dependence, unspecified, uncomplicated: Secondary | ICD-10-CM | POA: Insufficient documentation

## 2012-11-07 DIAGNOSIS — I079 Rheumatic tricuspid valve disease, unspecified: Secondary | ICD-10-CM | POA: Insufficient documentation

## 2012-11-07 DIAGNOSIS — I739 Peripheral vascular disease, unspecified: Secondary | ICD-10-CM | POA: Insufficient documentation

## 2012-11-07 DIAGNOSIS — R0601 Orthopnea: Secondary | ICD-10-CM | POA: Insufficient documentation

## 2012-11-07 DIAGNOSIS — Z8673 Personal history of transient ischemic attack (TIA), and cerebral infarction without residual deficits: Secondary | ICD-10-CM | POA: Insufficient documentation

## 2012-11-07 DIAGNOSIS — I379 Nonrheumatic pulmonary valve disorder, unspecified: Secondary | ICD-10-CM | POA: Insufficient documentation

## 2012-11-07 DIAGNOSIS — J4489 Other specified chronic obstructive pulmonary disease: Secondary | ICD-10-CM | POA: Insufficient documentation

## 2012-11-07 DIAGNOSIS — J449 Chronic obstructive pulmonary disease, unspecified: Secondary | ICD-10-CM | POA: Insufficient documentation

## 2012-11-07 DIAGNOSIS — R079 Chest pain, unspecified: Secondary | ICD-10-CM | POA: Insufficient documentation

## 2012-11-07 DIAGNOSIS — I2510 Atherosclerotic heart disease of native coronary artery without angina pectoris: Secondary | ICD-10-CM | POA: Insufficient documentation

## 2012-11-07 DIAGNOSIS — N189 Chronic kidney disease, unspecified: Secondary | ICD-10-CM | POA: Insufficient documentation

## 2012-11-07 NOTE — Progress Notes (Signed)
Echocardiogram performed.  

## 2012-11-10 ENCOUNTER — Telehealth: Payer: Self-pay | Admitting: Pulmonary Disease

## 2012-11-10 NOTE — Telephone Encounter (Signed)
Notes Recorded by Barbaraann Share, MD on 11/10/2012 at 2:12 PM Please let pt know his echo shows that his heart muscle has weakened, and probably the cause of his symptoms. See if he has had pfts' at [facility], and if so, we need to get them to send over results. Finally, let him know that I have sent a message to Dr. Patty Sermons about all of this.   I spoke with patient about results and he verbalized understanding and had no questions. Pt has already had PFT done over at [facility] last week. I called Marcelino Duster and she is faxing this to triage will await fax.

## 2012-11-11 NOTE — Telephone Encounter (Signed)
Fax not in triage.  [name], have you seen these results come across yet?

## 2012-11-11 NOTE — Telephone Encounter (Signed)
Pt PFT results are in [name] folder for review. Thanks.

## 2012-11-12 ENCOUNTER — Telehealth: Payer: Self-pay | Admitting: Pulmonary Disease

## 2012-11-12 NOTE — Telephone Encounter (Signed)
Please let pt know that his breathing studies show no copd or asthma, the size of his lungs are normal, but his uptake of oxygen thru the lungs is mildly abnormal.  This is most likely related to his weak heart muscle (not able to move blood thru lungs efficiently).  I do not see any pulmonary reason for his shortness of breath.  Dr. Patty Sermons should be contacting him soon to set up an appt.

## 2012-11-12 NOTE — Telephone Encounter (Signed)
LMOM x 1 

## 2012-11-13 NOTE — Telephone Encounter (Signed)
This has already been handled.

## 2012-11-13 NOTE — Telephone Encounter (Signed)
Pt aware of results and recs per [facility]. Appt made with Dr Patty Sermons yesterday---pt supposed to be seeing [facility] next week. Nothing further needed.

## 2012-11-20 ENCOUNTER — Ambulatory Visit (INDEPENDENT_AMBULATORY_CARE_PROVIDER_SITE_OTHER): Payer: Medicare Other | Admitting: Cardiology

## 2012-11-20 ENCOUNTER — Encounter: Payer: Self-pay | Admitting: Cardiology

## 2012-11-20 VITALS — BP 130/82 | HR 80 | Ht 66.0 in | Wt 170.0 lb

## 2012-11-20 DIAGNOSIS — I5022 Chronic systolic (congestive) heart failure: Secondary | ICD-10-CM | POA: Insufficient documentation

## 2012-11-20 DIAGNOSIS — I2510 Atherosclerotic heart disease of native coronary artery without angina pectoris: Secondary | ICD-10-CM

## 2012-11-20 DIAGNOSIS — I259 Chronic ischemic heart disease, unspecified: Secondary | ICD-10-CM

## 2012-11-20 DIAGNOSIS — I739 Peripheral vascular disease, unspecified: Secondary | ICD-10-CM

## 2012-11-20 DIAGNOSIS — I509 Heart failure, unspecified: Secondary | ICD-10-CM

## 2012-11-20 DIAGNOSIS — I119 Hypertensive heart disease without heart failure: Secondary | ICD-10-CM

## 2012-11-20 MED ORDER — LOSARTAN POTASSIUM 50 MG PO TABS: 50.0000 mg | ORAL_TABLET | Freq: Every day | ORAL | Status: DC

## 2012-11-20 MED ORDER — CARVEDILOL 6.25 MG PO TABS: 6.2500 mg | ORAL_TABLET | Freq: Two times a day (BID) | ORAL | Status: DC

## 2012-11-20 NOTE — Assessment & Plan Note (Signed)
The patient has not been experiencing any recent chest pain or angina.  He states that since he saw pulmonary his breathing is better.

## 2012-11-20 NOTE — Patient Instructions (Addendum)
STOP METOPROLOL   START CARVEDILOL 6.25 MG TWICE A DAY  START LOSARTAN 50 MG DAILY  Your physician recommends that you schedule a follow-up appointment in: 12/04/12 AT 1:30

## 2012-11-20 NOTE — Assessment & Plan Note (Signed)
The patient continues to have claudication.  We talked again about the importance of blood pressure and cholesterol control and the importance of smoking cessation.

## 2012-11-20 NOTE — Progress Notes (Signed)
[name] Date of Birth:  [birth year] [facility] [address] [place], [place]  [address] [phone]         Fax   [phone]  History of Present Illness: This pleasant 67 year old gentleman is seen for a scheduled followup office visit. He has a past history of ischemic heart disease. He underwent coronary artery bypass graft surgery on 05/21/08. He has had prior strokes. 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  The hiccups are not quite as frequent as they had been in the past. He's had long-standing high blood pressure. He has claudication of the legs and peripheral arterial occlusive disease and he continues to smoke a half to a full pack of cigarettes a day against advice. He has a history of sick sinus syndrome and has a functioning pacemaker.  The patient recently has been complaining of some increased shortness of breath.  He has seen pulmonary.  He had an echocardiogram on 11/07/12 showing that his left ventricular ejection fraction is down to 30-35% with severe LVH, moderate aortic stenosis, and mild mitral regurgitation.  The pulmonary artery pressure is 38.  In 2010 his ejection fraction was approximately 50%.  The patient has been poorly compliant with his medications.  He is not sure what medicines he is taking and he did not bring with him today.  Current Outpatient Prescriptions  Medication Sig Dispense Refill  . amLODipine (NORVASC) 5 MG tablet Take 1 tablet (5 mg total) by mouth daily.  30 tablet  5  . aspirin 81 MG tablet Take 81 mg by mouth daily.        Marland Kitchen atorvastatin (LIPITOR) 80 MG tablet Take 1 tablet (80 mg total) by mouth daily.  90 tablet  3  . baclofen (LIORESAL) 10 MG tablet Take 10 mg by mouth Daily.      . carvedilol (COREG) 6.25 MG tablet Take 1 tablet (6.25 mg total) by mouth 2 (two) times daily.  60 tablet  5  . chlorproMAZINE (THORAZINE) 25 MG tablet TAKE 1 TABLET  BY MOUTH 3 TIMES A DAY  90 tablet  1  . ciprofloxacin (CIPRO) 500 MG tablet Take 1 tablet (500 mg total) by mouth 2 (two) times daily.  6 tablet  0  . dexlansoprazole (DEXILANT) 60 MG capsule Take 1 capsule (60 mg total) by mouth daily.  90 capsule  3  . ezetimibe (ZETIA) 10 MG tablet Take 1 tablet (10 mg total) by mouth daily.  30 tablet  3  . glimepiride (AMARYL) 4 MG tablet Take 1 tablet (4 mg total) by mouth daily before breakfast.  30 tablet  4  . losartan (COZAAR) 50 MG tablet Take 1 tablet (50 mg total) by mouth daily.  30 tablet  5  . nitroGLYCERIN (NITROSTAT) 0.4 MG SL tablet Place 0.4 mg under the tongue every 5 (five) minutes as needed. For chest pain      . oxyCODONE-acetaminophen (PERCOCET/ROXICET) 5-325 MG per tablet Take 1-2 tablets by mouth every 6 (six) hours as needed.  30 tablet  0  . pantoprazole (PROTONIX) 40 MG tablet Take 40 mg by mouth daily.      . ranitidine (ZANTAC) 150 MG tablet TAKE 1 TABLET BY MOUTH TWICE A DAY  60 tablet  7   No current facility-administered medications for this visit.    Allergies  Allergen Reactions  . Imdur [Isosorbide Mononitrate]     HEADACHES   .  Metformin And Related     ABDOMINAL CRAMPS    Patient Active Problem List   Diagnosis Date Noted  . Benign hypertensive heart disease without heart failure 08/03/2010    Priority: High  . Hx of CABG 08/03/2010    Priority: Medium  . Intractable hiccoughs 08/03/2010    Priority: Medium  . Chronic systolic CHF (congestive heart failure) 11/20/2012  . PAD (peripheral artery disease) 11/20/2012  . Orthopnea 10/21/2012  . Hypersomnolence 09/11/2012  . Right arm pain 09/11/2012  . Dizziness and giddiness 05/23/2012  . Peripheral vascular disease, unspecified 03/14/2012  . Altered mental status 02/20/2012  . Hallucinations 02/20/2012  . Delirium 02/20/2012  . Chronic kidney disease, stage 3 02/19/2012  . Ischemic leg 02/18/2012  . Ischemic heart disease 01/08/2012  . Preventative  health care 11/06/2011  . GERD (gastroesophageal reflux disease) 11/06/2011  . Nephrolithiasis 11/06/2011  . Carotid stenosis 11/06/2011  . Dyslipidemia 09/05/2011  . Vascular claudication 11/15/2010  . Pacemaker 09/05/2010  . Tobacco abuse 08/03/2010  . Cardiac pacemaker in situ 10/08/2008  . DIABETES MELLITUS, TYPE II 09/20/2008  . HYPERTENSION 09/20/2008  . CAD 09/20/2008  . BRADYCARDIA 09/20/2008  . 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 TIA 09/20/2008  . PSORIASIS 09/20/2008    History  Smoking status  . Current Every Day Smoker -- 1.00 packs/day for 54 years  . Types: Cigarettes  Smokeless tobacco  . Former Neurosurgeon  . Quit date: 02/18/2012    History  Alcohol Use No    Family History  Problem Relation Age of Onset  . COPD Mother   . Diabetes Mother   . Heart disease Mother   . Hyperlipidemia Mother   . Hypertension Mother   . Heart attack Mother   . Coronary artery disease Father   . Heart disease Father     before age 7  . Hyperlipidemia Father   . Hypertension Father   . Heart attack Father     Review of Systems: Constitutional: no fever chills diaphoresis or fatigue or change in weight.  Head and neck: no hearing loss, no epistaxis, no photophobia or visual disturbance. Respiratory: No cough, shortness of breath or wheezing. Cardiovascular: No chest pain peripheral edema, palpitations. Gastrointestinal: No abdominal distention, no abdominal pain, no change in bowel habits hematochezia or melena. Genitourinary: No dysuria, no frequency, no urgency, no nocturia. Musculoskeletal:No arthralgias, no back pain, no gait disturbance or myalgias. Neurological: No dizziness, no headaches, no numbness, no seizures, no syncope, no weakness, no tremors. Hematologic: No lymphadenopathy, no easy bruising. Psychiatric: No confusion, no hallucinations, no sleep disturbance.    Physical Exam: Filed Vitals:   11/20/12 1433  BP: 130/82  Pulse: 80   the  general appearance reveals a well-developed elderly gentleman in no distress.The head and neck exam reveals pupils equal and reactive.  Extraocular movements are full.  There is no scleral icterus.  The mouth and pharynx are normal.  The neck is supple.  The carotids reveal no bruits.  The jugular venous pressure is normal.  The  thyroid is not enlarged.  There is no lymphadenopathy.  The chest is clear to percussion and auscultation.  There are no rales or rhonchi.  Expansion of the chest is symmetrical.  The precordium is quiet.  The first heart sound is normal.  The second heart sound is physiologically split.  There is no murmur gallop rub or click.  There is no abnormal lift or heave.  The abdomen is soft and nontender.  The bowel sounds are normal.  The liver and spleen are not enlarged.  There are no abdominal masses.  There are no abdominal bruits.  Extremities reveal weak pedal pulses.  I can feel a soft right posterior tibial pulse.  The others are equivocal. There is no phlebitis or edema.  There is no cyanosis or clubbing.  Strength is normal and symmetrical in all extremities.  There is no lateralizing weakness.  There are no sensory deficits.  The skin is warm and dry.  There is no rash.   Assessment / Plan: Stop metoprolol and begin carvedilol 6.25 mg twice a day and start losartan 50 mg one daily to try to improve systolic function Urged him to quit smoking altogether Return in 2 weeks for office visit and followup basal metabolic panel to be sure kidneys and potassium are okay.

## 2012-11-20 NOTE — Assessment & Plan Note (Signed)
His most recent echocardiogram shows a decreased left ventricular systolic function with an ejection fraction of 30-35%.  In view of this we are going to switch him from metoprolol to carvedilol in an initial dose of 6.25 mg twice a day.  We will also start losartan 50 mg one daily.

## 2012-12-02 ENCOUNTER — Other Ambulatory Visit: Payer: Self-pay | Admitting: Cardiology

## 2012-12-02 ENCOUNTER — Other Ambulatory Visit: Payer: Self-pay | Admitting: Internal Medicine

## 2012-12-04 ENCOUNTER — Encounter: Payer: Self-pay | Admitting: Cardiology

## 2012-12-04 ENCOUNTER — Ambulatory Visit (INDEPENDENT_AMBULATORY_CARE_PROVIDER_SITE_OTHER): Payer: Medicare Other | Admitting: Cardiology

## 2012-12-04 VITALS — BP 127/88 | HR 89 | Ht 66.0 in | Wt 169.0 lb

## 2012-12-04 DIAGNOSIS — I509 Heart failure, unspecified: Secondary | ICD-10-CM

## 2012-12-04 DIAGNOSIS — I259 Chronic ischemic heart disease, unspecified: Secondary | ICD-10-CM

## 2012-12-04 DIAGNOSIS — Z950 Presence of cardiac pacemaker: Secondary | ICD-10-CM

## 2012-12-04 DIAGNOSIS — I5022 Chronic systolic (congestive) heart failure: Secondary | ICD-10-CM

## 2012-12-04 DIAGNOSIS — I119 Hypertensive heart disease without heart failure: Secondary | ICD-10-CM

## 2012-12-04 DIAGNOSIS — I739 Peripheral vascular disease, unspecified: Secondary | ICD-10-CM

## 2012-12-04 DIAGNOSIS — E7800 Pure hypercholesterolemia, unspecified: Secondary | ICD-10-CM

## 2012-12-04 DIAGNOSIS — R066 Hiccough: Secondary | ICD-10-CM

## 2012-12-04 LAB — BASIC METABOLIC PANEL
CO2: 25 mEq/L (ref 19–32)
Calcium: 8.8 mg/dL (ref 8.4–10.5)
Creatinine, Ser: 1.6 mg/dL — ABNORMAL HIGH (ref 0.4–1.5)

## 2012-12-04 LAB — HEPATIC FUNCTION PANEL
AST: 13 U/L (ref 0–37)
Albumin: 3.4 g/dL — ABNORMAL LOW (ref 3.5–5.2)
Alkaline Phosphatase: 82 U/L (ref 39–117)
Total Protein: 7 g/dL (ref 6.0–8.3)

## 2012-12-04 LAB — LIPID PANEL
Cholesterol: 184 mg/dL (ref 0–200)
Total CHOL/HDL Ratio: 5
Triglycerides: 139 mg/dL (ref 0.0–149.0)

## 2012-12-04 NOTE — Assessment & Plan Note (Signed)
The patient has chronic systolic congestive heart failure with ejection fraction of 30-35%.  He also has moderate aortic stenosis and mild mitral regurgitation and mild pulmonary artery hypertension.  He is not having any acute paroxysmal nocturnal dyspnea or any significant peripheral edema.  We will continue current dose of meds.  We will check renal function today since starting ARB

## 2012-12-04 NOTE — Progress Notes (Signed)
[name] Date of Birth:  [birth year] [facility] [address] [place], [place]  [address] [phone]         Fax   [phone]  History of Present Illness: This pleasant 67 year old gentleman is seen for a scheduled followup office visit. He has a past history of ischemic heart disease. He underwent coronary artery bypass graft surgery on 05/21/08. He has had prior strokes. 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  The hiccups are not quite as frequent as they had been in the past. He's had long-standing high blood pressure. He has claudication of the legs and peripheral arterial occlusive disease and he continues to smoke a half to a full pack of cigarettes a day against advice. He has a history of sick sinus syndrome and has a functioning pacemaker.  The patient recently has been complaining of some increased shortness of breath.  He has seen pulmonary.  He had an echocardiogram on 11/07/12 showing that his left ventricular ejection fraction is down to 30-35% with severe LVH, moderate aortic stenosis, and mild mitral regurgitation.  The pulmonary artery pressure is 38.  In 2010 his ejection fraction was approximately 50%.  In response to his diminished left ventricular function at his last visit we switched him to carvedilol and added losartan.  The patient states that he cannot tell if he feels any better or worse since the medication has been changed.  He still has a cough and is short of breath.  He still smokes about a pack of cigarettes a day against advice.  His girlfriend also is a heavy smoker.  Current Outpatient Prescriptions  Medication Sig Dispense Refill  . amLODipine (NORVASC) 5 MG tablet Take 1 tablet (5 mg total) by mouth daily.  30 tablet  5  . aspirin 81 MG tablet Take 81 mg by mouth daily.        Marland Kitchen atorvastatin (LIPITOR) 80 MG tablet Take 1 tablet (80 mg total) by mouth daily.   90 tablet  3  . carvedilol (COREG) 6.25 MG tablet Take 1 tablet (6.25 mg total) by mouth 2 (two) times daily.  60 tablet  5  . DEXILANT 60 MG capsule TAKE 1 CAPSULE (60 MG TOTAL) BY MOUTH DAILY.  90 capsule  2  . ezetimibe (ZETIA) 10 MG tablet Take 1 tablet (10 mg total) by mouth daily.  30 tablet  3  . losartan (COZAAR) 50 MG tablet Take 1 tablet (50 mg total) by mouth daily.  30 tablet  5  . nitroGLYCERIN (NITROSTAT) 0.4 MG SL tablet Place 0.4 mg under the tongue every 5 (five) minutes as needed. For chest pain      . ranitidine (ZANTAC) 150 MG tablet TAKE 1 TABLET BY MOUTH TWICE A DAY  60 tablet  11  . glimepiride (AMARYL) 4 MG tablet TAKE 1 TABLET BY MOUTH EVERY DAY BEFORE BREAKFAST  30 tablet  11   No current facility-administered medications for this visit.    Allergies  Allergen Reactions  . Imdur [Isosorbide Mononitrate]     HEADACHES   . Metformin And Related     ABDOMINAL CRAMPS    Patient Active Problem List   Diagnosis Date Noted  . Benign hypertensive heart disease without heart failure 08/03/2010    Priority: High  . Hx of CABG 08/03/2010    Priority: Medium  . Intractable hiccoughs 08/03/2010    Priority: Medium  .  Chronic systolic CHF (congestive heart failure) 11/20/2012  . PAD (peripheral artery disease) 11/20/2012  . Orthopnea 10/21/2012  . Hypersomnolence 09/11/2012  . Right arm pain 09/11/2012  . Dizziness and giddiness 05/23/2012  . Peripheral vascular disease, unspecified 03/14/2012  . Altered mental status 02/20/2012  . Hallucinations 02/20/2012  . Delirium 02/20/2012  . Chronic kidney disease, stage 3 02/19/2012  . Ischemic leg 02/18/2012  . Ischemic heart disease 01/08/2012  . Preventative health care 11/06/2011  . GERD (gastroesophageal reflux disease) 11/06/2011  . Nephrolithiasis 11/06/2011  . Carotid stenosis 11/06/2011  . Dyslipidemia 09/05/2011  . Vascular claudication 11/15/2010  . Pacemaker 09/05/2010  . Tobacco abuse 08/03/2010  .  Cardiac pacemaker in situ 10/08/2008  . DIABETES MELLITUS, TYPE II 09/20/2008  . HYPERTENSION 09/20/2008  . CAD 09/20/2008  . BRADYCARDIA 09/20/2008  . 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 TIA 09/20/2008  . PSORIASIS 09/20/2008    History  Smoking status  . Current Every Day Smoker -- 1.00 packs/day for 54 years  . Types: Cigarettes  Smokeless tobacco  . Former Neurosurgeon  . Quit date: 02/18/2012    History  Alcohol Use No    Family History  Problem Relation Age of Onset  . COPD Mother   . Diabetes Mother   . Heart disease Mother   . Hyperlipidemia Mother   . Hypertension Mother   . Heart attack Mother   . Coronary artery disease Father   . Heart disease Father     before age 68  . Hyperlipidemia Father   . Hypertension Father   . Heart attack Father     Review of Systems: Constitutional: no fever chills diaphoresis or fatigue or change in weight.  Head and neck: no hearing loss, no epistaxis, no photophobia or visual disturbance. Respiratory: No cough, shortness of breath or wheezing. Cardiovascular: No chest pain peripheral edema, palpitations. Gastrointestinal: No abdominal distention, no abdominal pain, no change in bowel habits hematochezia or melena. Genitourinary: No dysuria, no frequency, no urgency, no nocturia. Musculoskeletal:No arthralgias, no back pain, no gait disturbance or myalgias. Neurological: No dizziness, no headaches, no numbness, no seizures, no syncope, no weakness, no tremors. Hematologic: No lymphadenopathy, no easy bruising. Psychiatric: No confusion, no hallucinations, no sleep disturbance.    Physical Exam: Filed Vitals:   12/04/12 1351  BP: 127/88  Pulse: 89   the general appearance reveals a well-developed elderly gentleman in no distress.The head and neck exam reveals pupils equal and reactive.  Extraocular movements are full.  There is no scleral icterus.  The mouth and pharynx are normal.  The neck is supple.  The carotids  reveal no bruits.  The jugular venous pressure is normal.  The  thyroid is not enlarged.  There is no lymphadenopathy.  The chest is clear to percussion and auscultation.  There are no rales or rhonchi.  Expansion of the chest is symmetrical.  The precordium is quiet.  The first heart sound is normal.  The second heart sound is physiologically split.  There is no murmur gallop rub or click.  There is no abnormal lift or heave.  The abdomen is soft and nontender.  The bowel sounds are normal.  The liver and spleen are not enlarged.  There are no abdominal masses.  There are no abdominal bruits.  Extremities reveal weak pedal pulses.  I can feel a soft right posterior tibial pulse.  The others are equivocal. There is no phlebitis or edema.  There is no cyanosis or clubbing.  Strength is normal and symmetrical in all extremities.  There is no lateralizing weakness.  There are no sensory deficits.  The skin is warm and dry.  There is no rash.   Assessment / Plan: Patient appears to be stable.  We are checking a basal metabolic panel today.  Continue same medication and be rechecked in 3 months for office visit EKG lipid panel hepatic function panel basal metabolic panel fasting

## 2012-12-04 NOTE — Assessment & Plan Note (Signed)
No change in intractable hiccups

## 2012-12-04 NOTE — Progress Notes (Signed)
Quick Note:  Please report to patient. The recent labs are stable. Continue same medication and careful diet. Lipids and BS are better ______

## 2012-12-04 NOTE — Assessment & Plan Note (Signed)
Blood pressure today is remaining stable on current therapy.  No Headaches dizziness or syncope

## 2012-12-04 NOTE — Patient Instructions (Addendum)
Will obtain labs today and call you with the results (bmet)  Your physician recommends that you continue on your current medications as directed. Please refer to the Current Medication list given to you today.  Your physician wants you to follow-up in: 3 months with fasting labs (LP/BMET/HFP)  You will receive a reminder letter in the mail two months in advance. If you don't receive a letter, please call our office to schedule the follow-up appointment.

## 2012-12-09 ENCOUNTER — Telehealth: Payer: Self-pay | Admitting: *Deleted

## 2012-12-09 NOTE — Telephone Encounter (Signed)
Advised patient of lab results  

## 2012-12-09 NOTE — Telephone Encounter (Signed)
Message copied by Burnell Blanks on Tue Dec 09, 2012  5:48 PM ------      Message from: [name]      Created: Thu Dec 04, 2012  8:20 PM       Please report to patient.  The recent labs are stable. Continue same medication and careful diet. Lipids and BS are better ------

## 2013-01-08 ENCOUNTER — Other Ambulatory Visit: Payer: Self-pay

## 2013-02-21 ENCOUNTER — Other Ambulatory Visit: Payer: Self-pay | Admitting: Internal Medicine

## 2013-03-11 ENCOUNTER — Encounter: Payer: Self-pay | Admitting: Cardiology

## 2013-03-11 ENCOUNTER — Ambulatory Visit (INDEPENDENT_AMBULATORY_CARE_PROVIDER_SITE_OTHER): Payer: Medicare Other | Admitting: Cardiology

## 2013-03-11 VITALS — BP 140/80 | HR 73 | Ht 66.0 in | Wt 173.0 lb

## 2013-03-11 DIAGNOSIS — I119 Hypertensive heart disease without heart failure: Secondary | ICD-10-CM

## 2013-03-11 DIAGNOSIS — I509 Heart failure, unspecified: Secondary | ICD-10-CM

## 2013-03-11 DIAGNOSIS — I259 Chronic ischemic heart disease, unspecified: Secondary | ICD-10-CM

## 2013-03-11 DIAGNOSIS — R066 Hiccough: Secondary | ICD-10-CM

## 2013-03-11 DIAGNOSIS — I739 Peripheral vascular disease, unspecified: Secondary | ICD-10-CM

## 2013-03-11 DIAGNOSIS — I5022 Chronic systolic (congestive) heart failure: Secondary | ICD-10-CM

## 2013-03-11 NOTE — Assessment & Plan Note (Signed)
Patient has had to take occasional sublingual nitroglycerin.  No crescendo pattern.

## 2013-03-11 NOTE — Progress Notes (Signed)
[name] Date of Birth:  1945-08-17 [address] [place], [place]  [address] [phone]         Fax   [phone]  History of Present Illness: This pleasant 68 year old gentleman is seen for a scheduled followup office visit. He has a past history of ischemic heart disease. He underwent coronary artery bypass graft surgery on 05/21/08. He has had prior strokes. 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  The hiccups occur about once a week and last for several days.  Even though it has been several years since his stroke, the hiccups do not appear to be getting any better. He's had long-standing high blood pressure. He has claudication of the legs and peripheral arterial occlusive disease and he continues to smoke cigarettes against advice. He has a history of sick sinus syndrome and has a functioning pacemaker.  The patient recently has been complaining of some increased shortness of breath.  He has seen pulmonary.  He had an echocardiogram on 11/07/12 showing that his left ventricular ejection fraction is down to 30-35% with severe LVH, moderate aortic stenosis, and mild mitral regurgitation.  The pulmonary artery pressure is 38.  In 2010 his ejection fraction was approximately 50%.  In response to his diminished left ventricular function at his last visit we switched him to carvedilol and added losartan.  The patient states that he cannot tell if he feels any better or worse since the medication has been changed.  He still has a cough and is short of breath.  He still smokes but has cut back.  His girlfriend also is a heavy smoker.  Current Outpatient Prescriptions  Medication Sig Dispense Refill  . amLODipine (NORVASC) 5 MG tablet Take 1 tablet (5 mg total) by mouth daily.  30 tablet  5  . aspirin 81 MG tablet Take 81 mg by mouth daily.        Marland Kitchen atorvastatin (LIPITOR) 80 MG tablet TAKE 1 TABLET BY MOUTH  EVERY DAY  90 tablet  2  . carvedilol (COREG) 6.25 MG tablet Take 6.25 mg by mouth 2 (two) times daily with a meal. PT STATES HE HAS BEEN ONLY TAKEN THIS ONCE DAILY      . DEXILANT 60 MG capsule TAKE 1 CAPSULE (60 MG TOTAL) BY MOUTH DAILY.  90 capsule  2  . ezetimibe (ZETIA) 10 MG tablet Take 1 tablet (10 mg total) by mouth daily.  30 tablet  3  . glimepiride (AMARYL) 4 MG tablet TAKE 1 TABLET BY MOUTH EVERY DAY BEFORE BREAKFAST  30 tablet  11  . losartan (COZAAR) 50 MG tablet Take 1 tablet (50 mg total) by mouth daily.  30 tablet  5  . nitroGLYCERIN (NITROSTAT) 0.4 MG SL tablet Place 0.4 mg under the tongue every 5 (five) minutes as needed. For chest pain      . ranitidine (ZANTAC) 150 MG tablet TAKE 1 TABLET BY MOUTH TWICE A DAY  60 tablet  11   No current facility-administered medications for this visit.    Allergies  Allergen Reactions  . Imdur [Isosorbide Mononitrate]     HEADACHES   . Metformin And Related     ABDOMINAL CRAMPS    Patient Active Problem List   Diagnosis Date Noted  . Benign hypertensive heart disease without heart failure 08/03/2010    Priority: High  . Hx of CABG 08/03/2010    Priority: Medium  .  Intractable hiccoughs 08/03/2010    Priority: Medium  . Chronic systolic CHF (congestive heart failure) 11/20/2012  . PAD (peripheral artery disease) 11/20/2012  . Orthopnea 10/21/2012  . Hypersomnolence 09/11/2012  . Right arm pain 09/11/2012  . Dizziness and giddiness 05/23/2012  . Peripheral vascular disease, unspecified 03/14/2012  . Altered mental status 02/20/2012  . Hallucinations 02/20/2012  . Delirium 02/20/2012  . Chronic kidney disease, stage 3 02/19/2012  . Ischemic leg 02/18/2012  . Ischemic heart disease 01/08/2012  . Preventative health care 11/06/2011  . GERD (gastroesophageal reflux disease) 11/06/2011  . Nephrolithiasis 11/06/2011  . Carotid stenosis 11/06/2011  . Dyslipidemia 09/05/2011  . Vascular claudication 11/15/2010  . Pacemaker  09/05/2010  . Tobacco abuse 08/03/2010  . Cardiac pacemaker in situ 10/08/2008  . DIABETES MELLITUS, TYPE II 09/20/2008  . HYPERTENSION 09/20/2008  . CAD 09/20/2008  . BRADYCARDIA 09/20/2008  . 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 TIA 09/20/2008  . PSORIASIS 09/20/2008    History  Smoking status  . Current Every Day Smoker -- 1.00 packs/day for 54 years  . Types: Cigarettes  Smokeless tobacco  . Former Neurosurgeon  . Quit date: 02/18/2012    History  Alcohol Use No    Family History  Problem Relation Age of Onset  . COPD Mother   . Diabetes Mother   . Heart disease Mother   . Hyperlipidemia Mother   . Hypertension Mother   . Heart attack Mother   . Coronary artery disease Father   . Heart disease Father     before age 4  . Hyperlipidemia Father   . Hypertension Father   . Heart attack Father     Review of Systems: Constitutional: no fever chills diaphoresis or fatigue or change in weight.  Head and neck: no hearing loss, no epistaxis, no photophobia or visual disturbance. Respiratory: No cough, shortness of breath or wheezing. Cardiovascular: No chest pain peripheral edema, palpitations. Gastrointestinal: No abdominal distention, no abdominal pain, no change in bowel habits hematochezia or melena. Genitourinary: No dysuria, no frequency, no urgency, no nocturia. Musculoskeletal:No arthralgias, no back pain, no gait disturbance or myalgias. Neurological: No dizziness, no headaches, no numbness, no seizures, no syncope, no weakness, no tremors. Hematologic: No lymphadenopathy, no easy bruising. Psychiatric: No confusion, no hallucinations, no sleep disturbance.    Physical Exam: Filed Vitals:   03/11/13 1558  BP: 140/80  Pulse: 73   the general appearance reveals a well-developed elderly gentleman in no distress.The head and neck exam reveals pupils equal and reactive.  Extraocular movements are full.  There is no scleral icterus.  The mouth and pharynx  are normal.  The neck is supple.  The carotids reveal no bruits.  The jugular venous pressure is normal.  The  thyroid is not enlarged.  There is no lymphadenopathy.  The chest is clear to percussion and auscultation.  There are no rales or rhonchi.  Expansion of the chest is symmetrical.  The precordium is quiet.  The first heart sound is normal.  The second heart sound is physiologically split.  There is no murmur gallop rub or click.  There is no abnormal lift or heave.  The abdomen is soft and nontender.  The bowel sounds are normal.  The liver and spleen are not enlarged.  There are no abdominal masses.  There are no abdominal bruits.  Extremities reveal weak pedal pulses.  I can feel a soft right posterior tibial pulse.  The others are equivocal. There is no  phlebitis or edema.  There is no cyanosis or clubbing.  Strength is normal and symmetrical in all extremities.  There is no lateralizing weakness.  There are no sensory deficits.  The skin is warm and dry.  There is no rash.  EKG shows atrial paced rhythm with LVH and strain pattern unchanged since 07/11/12.  Assessment / Plan: Overall the patient appears to be stable.  His lungs sound better than usual and he does state that he has cut back on the number of cigarettes he smokes daily.  He will continue same medication.  Recheck in 4 months for followup office visit lipid panel hepatic function panel and basal metabolic panel.  Urged him to eventually quit smoking altogether.

## 2013-03-11 NOTE — Assessment & Plan Note (Signed)
The patient's symptoms of chronic systolic heart failure have improved since last visit.  He appears to be less dyspneic.

## 2013-03-11 NOTE — Patient Instructions (Signed)
Your physician recommends that you continue on your current medications as directed. Please refer to the Current Medication list given to you today.  Your physician wants you to follow-up in: 4 months with fasting labs (lp/bmet/hfp) You will receive a reminder letter in the mail two months in advance. If you don't receive a letter, please call our office to schedule the follow-up appointment.  

## 2013-03-11 NOTE — Assessment & Plan Note (Signed)
The patient is still limited by his claudication in terms of walking long distances.  His symptoms are no worse than at his last visit.  He is relatively sedentary however.

## 2013-05-06 ENCOUNTER — Other Ambulatory Visit: Payer: Self-pay | Admitting: Cardiology

## 2013-05-08 ENCOUNTER — Encounter: Payer: Self-pay | Admitting: Cardiology

## 2013-05-08 ENCOUNTER — Ambulatory Visit (INDEPENDENT_AMBULATORY_CARE_PROVIDER_SITE_OTHER): Payer: Medicare Other | Admitting: Cardiology

## 2013-05-08 ENCOUNTER — Other Ambulatory Visit: Payer: Medicare Other

## 2013-05-08 VITALS — BP 141/88 | HR 84 | Ht 66.0 in | Wt 170.0 lb

## 2013-05-08 DIAGNOSIS — F17200 Nicotine dependence, unspecified, uncomplicated: Secondary | ICD-10-CM

## 2013-05-08 DIAGNOSIS — I119 Hypertensive heart disease without heart failure: Secondary | ICD-10-CM

## 2013-05-08 DIAGNOSIS — G459 Transient cerebral ischemic attack, unspecified: Secondary | ICD-10-CM

## 2013-05-08 DIAGNOSIS — R42 Dizziness and giddiness: Secondary | ICD-10-CM

## 2013-05-08 DIAGNOSIS — I739 Peripheral vascular disease, unspecified: Secondary | ICD-10-CM

## 2013-05-08 DIAGNOSIS — G450 Vertebro-basilar artery syndrome: Secondary | ICD-10-CM

## 2013-05-08 DIAGNOSIS — I259 Chronic ischemic heart disease, unspecified: Secondary | ICD-10-CM

## 2013-05-08 DIAGNOSIS — Z720 Tobacco use: Secondary | ICD-10-CM

## 2013-05-08 MED ORDER — CLOPIDOGREL BISULFATE 75 MG PO TABS: 75.0000 mg | ORAL_TABLET | Freq: Every day | ORAL | Status: DC

## 2013-05-08 MED ORDER — MECLIZINE HCL 25 MG PO TABS: 25.0000 mg | ORAL_TABLET | Freq: Three times a day (TID) | ORAL | Status: DC | PRN

## 2013-05-08 NOTE — Assessment & Plan Note (Signed)
Many of his current symptoms including his exertional dyspnea and his claudication are related to his smoking.  I urged him to quit and suggested that he try a Nicoderm patch.  His girlfriend is also encouraged him to do that.

## 2013-05-08 NOTE — Assessment & Plan Note (Signed)
The patient is very sedentary.  He just walks from one room to the next at home.  He is still smoking but less than a pack a day.  We will add Plavix 75 mg one daily for his PAD.

## 2013-05-08 NOTE — Assessment & Plan Note (Signed)
The patient continues to have intermittent problems with hiccups.  He is also been having positional vertigo.  We will add meclizine 25 mg every 8 hours when necessary.

## 2013-05-08 NOTE — Patient Instructions (Signed)
START PLAVIX 75 MG DAILY   TRY MECLIZINE 25 MG 1 EVERY 8 HOURS AS NEEDED FOR DIZZINESS  YOU NEED TO STOP SMOKING, TRY NICODERM OTC PATCH TO HELP WITH THIS  Your physician recommends that you schedule a follow-up appointment in: SOON FOR PACER CHECK  Your physician recommends that you schedule a follow-up appointment in: 3 months with fasting labs (LP/BMET/HFP/CBC)

## 2013-05-08 NOTE — Assessment & Plan Note (Signed)
The patient continues to have spells of suggestive TIA and he is known to have widespread vascular disease.  We will add Plavix to his regimen he will continue baby aspirin daily.

## 2013-05-08 NOTE — Progress Notes (Signed)
[name] Date of Birth:  May 03, 1945 [address] [place], [place]  [address] [phone]         Fax   [phone]  History of Present Illness: This pleasant 68 year old gentleman is seen for a scheduled followup office visit. He has a past history of ischemic heart disease. He underwent coronary artery bypass graft surgery on 05/21/08. He has had prior strokes. 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  The hiccups occur about once a week and last for several days.  Even though it has been several years since his stroke, the hiccups do not appear to be getting any better. He's had long-standing high blood pressure. He has claudication of the legs and peripheral arterial occlusive disease and he continues to smoke cigarettes against advice. He has a history of sick sinus syndrome and has a functioning pacemaker.  He has not been seen in the pacemaker clinic recently.  The patient recently has been complaining of some increased shortness of breath.  He has seen pulmonary.  He had an echocardiogram on 11/07/12 showing that his left ventricular ejection fraction is down to 30-35% with severe LVH, moderate aortic stenosis, and mild mitral regurgitation.  The pulmonary artery pressure is 38.  In 2010 his ejection fraction was approximately 50%.  In response to his diminished left ventricular function at his last visit we switched him to carvedilol and added losartan.  The patient states that he cannot tell if he feels any better or worse since the medication has been changed.  He still has a cough and is short of breath.  He still smokes but has cut back.  His girlfriend also is a heavy smoker.  Since last visit he has been more short of breath and has also been having problems with vertigo.  Also complains that his extremities stay very cold.  He's also had some numbness of the right side of his mouth and  lips.  Current Outpatient Prescriptions  Medication Sig Dispense Refill  . amLODipine (NORVASC) 5 MG tablet TAKE 1 TABLET BY MOUTH DAILY  30 tablet  5  . aspirin 81 MG tablet Take 81 mg by mouth daily.        Marland Kitchen atorvastatin (LIPITOR) 80 MG tablet TAKE 1 TABLET BY MOUTH EVERY DAY  90 tablet  2  . carvedilol (COREG) 6.25 MG tablet Take 6.25 mg by mouth 2 (two) times daily with a meal. PT STATES HE HAS BEEN ONLY TAKEN THIS ONCE DAILY      . DEXILANT 60 MG capsule TAKE 1 CAPSULE (60 MG TOTAL) BY MOUTH DAILY.  90 capsule  2  . ezetimibe (ZETIA) 10 MG tablet Take 1 tablet (10 mg total) by mouth daily.  30 tablet  3  . glimepiride (AMARYL) 4 MG tablet TAKE 1 TABLET BY MOUTH EVERY DAY BEFORE BREAKFAST  30 tablet  11  . losartan (COZAAR) 50 MG tablet Take 1 tablet (50 mg total) by mouth daily.  30 tablet  5  . Nicotine (NICODERM CQ TD) Place onto the skin.      Marland Kitchen nitroGLYCERIN (NITROSTAT) 0.4 MG SL tablet Place 0.4 mg under the tongue every 5 (five) minutes as needed. For chest pain      . ranitidine (ZANTAC) 150 MG tablet TAKE 1 TABLET BY MOUTH TWICE A DAY  60 tablet  11  . ZETIA 10 MG tablet TAKE 1 TABLET BY  MOUTH EVERY DAY  90 tablet  1  . clopidogrel (PLAVIX) 75 MG tablet Take 1 tablet (75 mg total) by mouth daily.  30 tablet  5  . meclizine (ANTIVERT) 25 MG tablet Take 1 tablet (25 mg total) by mouth every 8 (eight) hours as needed for dizziness.  30 tablet  2   No current facility-administered medications for this visit.    Allergies  Allergen Reactions  . Imdur [Isosorbide Mononitrate]     HEADACHES   . Metformin And Related     ABDOMINAL CRAMPS    Patient Active Problem List   Diagnosis Date Noted  . Benign hypertensive heart disease without heart failure 08/03/2010    Priority: High  . Hx of CABG 08/03/2010    Priority: Medium  . Intractable hiccoughs 08/03/2010    Priority: Medium  . Chronic systolic CHF (congestive heart failure) 11/20/2012  . PAD (peripheral artery disease)  11/20/2012  . Orthopnea 10/21/2012  . Hypersomnolence 09/11/2012  . Right arm pain 09/11/2012  . Dizziness and giddiness 05/23/2012  . Peripheral vascular disease, unspecified 03/14/2012  . Altered mental status 02/20/2012  . Hallucinations 02/20/2012  . Delirium 02/20/2012  . Chronic kidney disease, stage 3 02/19/2012  . Ischemic leg 02/18/2012  . Ischemic heart disease 01/08/2012  . Preventative health care 11/06/2011  . GERD (gastroesophageal reflux disease) 11/06/2011  . Nephrolithiasis 11/06/2011  . Carotid stenosis 11/06/2011  . Dyslipidemia 09/05/2011  . Vascular claudication 11/15/2010  . Pacemaker 09/05/2010  . Tobacco abuse 08/03/2010  . Cardiac pacemaker in situ 10/08/2008  . DIABETES MELLITUS, TYPE II 09/20/2008  . HYPERTENSION 09/20/2008  . CAD 09/20/2008  . BRADYCARDIA 09/20/2008  . 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 TIA 09/20/2008  . PSORIASIS 09/20/2008    History  Smoking status  . Current Every Day Smoker -- 1.00 packs/day for 54 years  . Types: Cigarettes  Smokeless tobacco  . Former Neurosurgeon  . Quit date: 02/18/2012    History  Alcohol Use No    Family History  Problem Relation Age of Onset  . COPD Mother   . Diabetes Mother   . Heart disease Mother   . Hyperlipidemia Mother   . Hypertension Mother   . Heart attack Mother   . Coronary artery disease Father   . Heart disease Father     before age 26  . Hyperlipidemia Father   . Hypertension Father   . Heart attack Father     Review of Systems: Constitutional: no fever chills diaphoresis or fatigue or change in weight.  Head and neck: no hearing loss, no epistaxis, no photophobia or visual disturbance. Respiratory: No cough, shortness of breath or wheezing. Cardiovascular: No chest pain peripheral edema, palpitations. Gastrointestinal: No abdominal distention, no abdominal pain, no change in bowel habits hematochezia or melena. Genitourinary: No dysuria, no frequency, no  urgency, no nocturia. Musculoskeletal:No arthralgias, no back pain, no gait disturbance or myalgias. Neurological: No dizziness, no headaches, no numbness, no seizures, no syncope, no weakness, no tremors. Hematologic: No lymphadenopathy, no easy bruising. Psychiatric: No confusion, no hallucinations, no sleep disturbance.    Physical Exam: Filed Vitals:   05/08/13 0928  BP: 141/88  Pulse: 84   the general appearance reveals a well-developed elderly gentleman in no distress.The head and neck exam reveals pupils equal and reactive.  Extraocular movements are full.  There is no scleral icterus.  The mouth and pharynx are normal.  The neck is supple.  The carotids reveal no bruits.  The jugular venous pressure is normal.  The  thyroid is not enlarged.  There is no lymphadenopathy.  The chest is clear to percussion and auscultation.  There are no rales or rhonchi.  Expansion of the chest is symmetrical.  The precordium is quiet.  The first heart sound is normal.  The second heart sound is physiologically split.  There is no murmur gallop rub or click.  There is no abnormal lift or heave.  The abdomen is soft and nontender.  The bowel sounds are normal.  The liver and spleen are not enlarged.  There are no abdominal masses.  There are no abdominal bruits.  Extremities reveal weak pedal pulses.  I can feel a soft right posterior tibial pulse.  The others are equivocal. There is no phlebitis or edema.  There is no cyanosis or clubbing.  Strength is normal and symmetrical in all extremities.  There is no lateralizing weakness.  There are no sensory deficits.  The skin is warm and dry.  There is no rash.  EKG shows atrial paced rhythm with LVH and strain pattern increased since last tracing 03/13/13.  Since 03/13/13, atrial pacing spikes are not seen today.  Assessment / Plan: Continue same medications.  Add Plavix 75 mg one daily.  Add Nicoderm patch one daily.  Head meclizine 25 mg every 8 hours when  necessary for vertigo.  Must stop smoking. The patient states that he does not have any followup for his pacemaker in the pacemaker clinic.  We will try to set that up. Recheck in 3 months for office visit CBC lipid panel hepatic function panel and basal metabolic panel.

## 2013-05-13 ENCOUNTER — Encounter: Payer: Self-pay | Admitting: Internal Medicine

## 2013-05-13 ENCOUNTER — Ambulatory Visit (INDEPENDENT_AMBULATORY_CARE_PROVIDER_SITE_OTHER): Payer: Medicare Other | Admitting: *Deleted

## 2013-05-13 DIAGNOSIS — I498 Other specified cardiac arrhythmias: Secondary | ICD-10-CM

## 2013-05-13 LAB — MDC_IDC_ENUM_SESS_TYPE_INCLINIC
Battery Impedance: 205 Ohm
Battery Remaining Longevity: 142 mo
Battery Voltage: 2.79 V
Brady Statistic AP VS Percent: 21 %
Date Time Interrogation Session: 20150311173309
Lead Channel Impedance Value: 466 Ohm
Lead Channel Pacing Threshold Pulse Width: 0.4 ms
Lead Channel Pacing Threshold Pulse Width: 0.4 ms
Lead Channel Setting Pacing Amplitude: 2.5 V
Lead Channel Setting Pacing Pulse Width: 0.4 ms
Lead Channel Setting Sensing Sensitivity: 5.6 mV
MDC IDC MSMT LEADCHNL RA PACING THRESHOLD AMPLITUDE: 1 V
MDC IDC MSMT LEADCHNL RA SENSING INTR AMPL: 2 mV
MDC IDC MSMT LEADCHNL RV IMPEDANCE VALUE: 548 Ohm
MDC IDC MSMT LEADCHNL RV PACING THRESHOLD AMPLITUDE: 1 V
MDC IDC MSMT LEADCHNL RV SENSING INTR AMPL: 22.4 mV
MDC IDC SET LEADCHNL RA PACING AMPLITUDE: 2 V
MDC IDC STAT BRADY AP VP PERCENT: 0 %
MDC IDC STAT BRADY AS VP PERCENT: 1 %
MDC IDC STAT BRADY AS VS PERCENT: 78 %

## 2013-05-13 NOTE — Progress Notes (Signed)
Pacemaker check in clinic (last check 2012). Normal device function. Thresholds, sensing, impedances consistent with previous measurements. Device programmed to maximize longevity. 7 mode switches (<0.1%)---all <30 sec/no EGMs. 8 high ventricular rates noted since 2012 x 18 bts, Max V 213---markers indicate NSVT. Device programmed at appropriate safety margins. Histogram distribution appropriate for patient activity level. Device programmed to optimize intrinsic conduction. Estimated longevity 12 years. Patient will follow up with [facility] once it has been received and then once month afterwards. Patient has also been made an appointment to F/U with [facility] in 3 months.

## 2013-05-14 ENCOUNTER — Encounter: Payer: Self-pay | Admitting: Pulmonary Disease

## 2013-05-14 ENCOUNTER — Ambulatory Visit (INDEPENDENT_AMBULATORY_CARE_PROVIDER_SITE_OTHER): Payer: Medicare Other | Admitting: Pulmonary Disease

## 2013-05-14 VITALS — BP 110/80 | HR 71 | Temp 98.1°F | Ht 66.0 in | Wt 176.2 lb

## 2013-05-14 DIAGNOSIS — R0989 Other specified symptoms and signs involving the circulatory and respiratory systems: Secondary | ICD-10-CM

## 2013-05-14 DIAGNOSIS — R0609 Other forms of dyspnea: Secondary | ICD-10-CM

## 2013-05-14 DIAGNOSIS — I259 Chronic ischemic heart disease, unspecified: Secondary | ICD-10-CM

## 2013-05-14 NOTE — Patient Instructions (Signed)
I do not hear any wheezing or congestion on your lung exam.  Your shortness of breath may be due to your heart issues. You need to go to the emergency room now to evaluate your face and arm/hand numbness, which may be due to a "mini stroke".

## 2013-05-14 NOTE — Progress Notes (Signed)
   Subjective:    Patient ID: [name], male    DOB: [birth year], 68 y.o.   MRN: [id number]  HPI The patient comes in today for an acute sick visit. He has a history of dyspnea on exertion, however a dural pulmonary workup failed to yield any pulmonary etiology for this. He did have an echocardiogram done that showed a new cardiomyopathy, with an ejection fraction of 30%. This was felt to be ischemic in origin. The patient gives a 2 week history of increasing shortness of breath, but minimal cough, no mucus, and denies chest pain. He has also had the development of a whole face numbness, especially involving his lips. This has also occurred in his arms and hands. He has a history of significant cerebrovascular disease, and has had a stroke in the past.   Review of Systems  Constitutional: Negative for fever and unexpected weight change.  HENT: Negative for congestion, dental problem, ear pain, nosebleeds, postnasal drip, rhinorrhea, sinus pressure, sneezing, sore throat and trouble swallowing.   Eyes: Negative for redness and itching.  Respiratory: Positive for chest tightness and shortness of breath. Negative for cough and wheezing.   Cardiovascular: Negative for palpitations and leg swelling.  Gastrointestinal: Negative for nausea and vomiting.  Genitourinary: Negative for dysuria.  Musculoskeletal: Negative for joint swelling.  Skin: Negative for rash.  Neurological: Positive for numbness ( face and lips). Negative for headaches.  Hematological: Does not bruise/bleed easily.  Psychiatric/Behavioral: Negative for dysphoric mood. The patient is not nervous/anxious.        Objective:   Physical Exam Thin male in no acute distress Nose without purulence or discharge noted Neck without lymphadenopathy or thyromegaly Chest with adequate airflow, no true wheezing or rhonchi, mild upper airway pseudo wheezing. Cardiac exam with regular rate and rhythm, 2/6 systolic murmur Lower  extremities without edema, no cyanosis Alert and oriented, moves all 4 extremities. Cranial nerve exam appears to be normal, however he does have unusual movement of his lips at times.  Speech sounds abnormal, and he is difficult to understand.        Assessment & Plan:

## 2013-05-14 NOTE — Assessment & Plan Note (Signed)
The patient is having worsening dyspnea over the last few weeks, and this has been associated with facial and lip numbness, and also involving his arms and hands. He does not appear anxious, nor does he appear to be hyperventilating. He has had a pulmonary workup, with surprisingly no evidence for COPD by PFTs. He has adequate air flow on exam today with no true wheezing noted. I suspect his shortness of breath is related to his known cardiac disease, but I am also concerned about his neurologic symptoms possibly representing CNS ischemia. I have asked the patient to go to the emergency room immediately for evaluation.

## 2013-06-09 ENCOUNTER — Emergency Department (HOSPITAL_COMMUNITY): Payer: Medicare Other

## 2013-06-09 ENCOUNTER — Encounter (HOSPITAL_COMMUNITY): Payer: Self-pay | Admitting: Emergency Medicine

## 2013-06-09 ENCOUNTER — Inpatient Hospital Stay (HOSPITAL_COMMUNITY)
Admission: EM | Admit: 2013-06-09 | Discharge: 2013-06-14 | DRG: 246 | Disposition: A | Discharge status: Home / Self Care | Payer: Medicare Other | Attending: Cardiovascular Disease | Admitting: Cardiovascular Disease

## 2013-06-09 DIAGNOSIS — K219 Gastro-esophageal reflux disease without esophagitis: Secondary | ICD-10-CM | POA: Diagnosis present

## 2013-06-09 DIAGNOSIS — Z79899 Other long term (current) drug therapy: Secondary | ICD-10-CM

## 2013-06-09 DIAGNOSIS — I2589 Other forms of chronic ischemic heart disease: Secondary | ICD-10-CM | POA: Diagnosis present

## 2013-06-09 DIAGNOSIS — N183 Chronic kidney disease, stage 3 unspecified: Secondary | ICD-10-CM | POA: Diagnosis present

## 2013-06-09 DIAGNOSIS — I6529 Occlusion and stenosis of unspecified carotid artery: Secondary | ICD-10-CM | POA: Diagnosis present

## 2013-06-09 DIAGNOSIS — E119 Type 2 diabetes mellitus without complications: Secondary | ICD-10-CM | POA: Diagnosis present

## 2013-06-09 DIAGNOSIS — I255 Ischemic cardiomyopathy: Secondary | ICD-10-CM

## 2013-06-09 DIAGNOSIS — J4489 Other specified chronic obstructive pulmonary disease: Secondary | ICD-10-CM | POA: Diagnosis present

## 2013-06-09 DIAGNOSIS — I5023 Acute on chronic systolic (congestive) heart failure: Secondary | ICD-10-CM

## 2013-06-09 DIAGNOSIS — I509 Heart failure, unspecified: Secondary | ICD-10-CM | POA: Diagnosis present

## 2013-06-09 DIAGNOSIS — I2510 Atherosclerotic heart disease of native coronary artery without angina pectoris: Secondary | ICD-10-CM | POA: Diagnosis present

## 2013-06-09 DIAGNOSIS — Z86718 Personal history of other venous thrombosis and embolism: Secondary | ICD-10-CM

## 2013-06-09 DIAGNOSIS — Z7982 Long term (current) use of aspirin: Secondary | ICD-10-CM

## 2013-06-09 DIAGNOSIS — Z720 Tobacco use: Secondary | ICD-10-CM | POA: Diagnosis present

## 2013-06-09 DIAGNOSIS — R0609 Other forms of dyspnea: Secondary | ICD-10-CM

## 2013-06-09 DIAGNOSIS — T82897A Other specified complication of cardiac prosthetic devices, implants and grafts, initial encounter: Principal | ICD-10-CM | POA: Diagnosis present

## 2013-06-09 DIAGNOSIS — N039 Chronic nephritic syndrome with unspecified morphologic changes: Secondary | ICD-10-CM

## 2013-06-09 DIAGNOSIS — I658 Occlusion and stenosis of other precerebral arteries: Secondary | ICD-10-CM | POA: Diagnosis present

## 2013-06-09 DIAGNOSIS — I214 Non-ST elevation (NSTEMI) myocardial infarction: Secondary | ICD-10-CM | POA: Diagnosis present

## 2013-06-09 DIAGNOSIS — J449 Chronic obstructive pulmonary disease, unspecified: Secondary | ICD-10-CM

## 2013-06-09 DIAGNOSIS — I119 Hypertensive heart disease without heart failure: Secondary | ICD-10-CM

## 2013-06-09 DIAGNOSIS — Z7902 Long term (current) use of antithrombotics/antiplatelets: Secondary | ICD-10-CM

## 2013-06-09 DIAGNOSIS — Z951 Presence of aortocoronary bypass graft: Secondary | ICD-10-CM

## 2013-06-09 DIAGNOSIS — R471 Dysarthria and anarthria: Secondary | ICD-10-CM | POA: Diagnosis not present

## 2013-06-09 DIAGNOSIS — Y840 Cardiac catheterization as the cause of abnormal reaction of the patient, or of later complication, without mention of misadventure at the time of the procedure: Secondary | ICD-10-CM | POA: Diagnosis present

## 2013-06-09 DIAGNOSIS — E876 Hypokalemia: Secondary | ICD-10-CM

## 2013-06-09 DIAGNOSIS — Z950 Presence of cardiac pacemaker: Secondary | ICD-10-CM

## 2013-06-09 DIAGNOSIS — Z8673 Personal history of transient ischemic attack (TIA), and cerebral infarction without residual deficits: Secondary | ICD-10-CM

## 2013-06-09 DIAGNOSIS — I2582 Chronic total occlusion of coronary artery: Secondary | ICD-10-CM | POA: Diagnosis present

## 2013-06-09 DIAGNOSIS — I10 Essential (primary) hypertension: Secondary | ICD-10-CM | POA: Diagnosis present

## 2013-06-09 DIAGNOSIS — I5022 Chronic systolic (congestive) heart failure: Secondary | ICD-10-CM

## 2013-06-09 DIAGNOSIS — F17200 Nicotine dependence, unspecified, uncomplicated: Secondary | ICD-10-CM | POA: Diagnosis present

## 2013-06-09 DIAGNOSIS — I130 Hypertensive heart and chronic kidney disease with heart failure and stage 1 through stage 4 chronic kidney disease, or unspecified chronic kidney disease: Secondary | ICD-10-CM | POA: Diagnosis present

## 2013-06-09 DIAGNOSIS — Z9861 Coronary angioplasty status: Secondary | ICD-10-CM

## 2013-06-09 DIAGNOSIS — R29810 Facial weakness: Secondary | ICD-10-CM | POA: Diagnosis not present

## 2013-06-09 DIAGNOSIS — E785 Hyperlipidemia, unspecified: Secondary | ICD-10-CM | POA: Diagnosis present

## 2013-06-09 DIAGNOSIS — I259 Chronic ischemic heart disease, unspecified: Secondary | ICD-10-CM

## 2013-06-09 DIAGNOSIS — I6350 Cerebral infarction due to unspecified occlusion or stenosis of unspecified cerebral artery: Secondary | ICD-10-CM | POA: Diagnosis not present

## 2013-06-09 DIAGNOSIS — G459 Transient cerebral ischemic attack, unspecified: Secondary | ICD-10-CM | POA: Diagnosis present

## 2013-06-09 DIAGNOSIS — R4789 Other speech disturbances: Secondary | ICD-10-CM | POA: Diagnosis not present

## 2013-06-09 DIAGNOSIS — I200 Unstable angina: Secondary | ICD-10-CM

## 2013-06-09 HISTORY — DX: Personal history of other diseases of the digestive system: Z87.19

## 2013-06-09 HISTORY — DX: Peripheral vascular disease, unspecified: I73.9

## 2013-06-09 HISTORY — DX: Ischemic cardiomyopathy: I25.5

## 2013-06-09 HISTORY — DX: Personal history of peptic ulcer disease: Z87.11

## 2013-06-09 HISTORY — DX: Cerebral infarction, unspecified: I63.9

## 2013-06-09 HISTORY — DX: Hypertensive heart disease without heart failure: I11.9

## 2013-06-09 HISTORY — DX: Chronic systolic (congestive) heart failure: I50.22

## 2013-06-09 HISTORY — DX: Transient cerebral ischemic attack, unspecified: G45.9

## 2013-06-09 HISTORY — DX: Type 2 diabetes mellitus without complications: E11.9

## 2013-06-09 LAB — COMPREHENSIVE METABOLIC PANEL
ALBUMIN: 3.4 g/dL — AB (ref 3.5–5.2)
ALK PHOS: 113 U/L (ref 39–117)
ALT: 7 U/L (ref 0–53)
AST: 12 U/L (ref 0–37)
BILIRUBIN TOTAL: 0.9 mg/dL (ref 0.3–1.2)
BUN: 13 mg/dL (ref 6–23)
CHLORIDE: 96 meq/L (ref 96–112)
CO2: 25 mEq/L (ref 19–32)
Calcium: 9.6 mg/dL (ref 8.4–10.5)
Creatinine, Ser: 1.73 mg/dL — ABNORMAL HIGH (ref 0.50–1.35)
GFR calc Af Amer: 46 mL/min — ABNORMAL LOW (ref >90)
GFR calc non Af Amer: 40 mL/min — ABNORMAL LOW (ref >90)
Glucose, Bld: 150 mg/dL — ABNORMAL HIGH (ref 70–99)
POTASSIUM: 3.9 meq/L (ref 3.7–5.3)
SODIUM: 139 meq/L (ref 137–147)
TOTAL PROTEIN: 7.8 g/dL (ref 6.0–8.3)

## 2013-06-09 LAB — I-STAT TROPONIN, ED: TROPONIN I, POC: 0.3 ng/mL — AB (ref 0.00–0.08)

## 2013-06-09 LAB — CBC
HCT: 39.2 % (ref 39.0–52.0)
Hemoglobin: 12.8 g/dL — ABNORMAL LOW (ref 13.0–17.0)
MCH: 27.2 pg (ref 26.0–34.0)
MCHC: 32.7 g/dL (ref 30.0–36.0)
MCV: 83.2 fL (ref 78.0–100.0)
PLATELETS: 314 10*3/uL (ref 150–400)
RBC: 4.71 MIL/uL (ref 4.22–5.81)
RDW: 15.9 % — AB (ref 11.5–15.5)
WBC: 6.4 10*3/uL (ref 4.0–10.5)

## 2013-06-09 LAB — PROTIME-INR
INR: 1.16 (ref 0.00–1.49)
PROTHROMBIN TIME: 14.6 s (ref 11.6–15.2)

## 2013-06-09 LAB — TSH: TSH: 4.72 u[IU]/mL — ABNORMAL HIGH (ref 0.350–4.500)

## 2013-06-09 LAB — TROPONIN I: Troponin I: 0.52 ng/mL (ref <0.30)

## 2013-06-09 LAB — APTT: aPTT: 30 seconds (ref 24–37)

## 2013-06-09 LAB — MRSA PCR SCREENING: MRSA by PCR: NEGATIVE

## 2013-06-09 LAB — HEPARIN LEVEL (UNFRACTIONATED): Heparin Unfractionated: 0.21 IU/mL — ABNORMAL LOW (ref 0.30–0.70)

## 2013-06-09 MED ORDER — HEPARIN (PORCINE) IN NACL 100-0.45 UNIT/ML-% IJ SOLN
1150.0000 [IU]/h | INTRAMUSCULAR | Status: DC
  Administered 2013-06-09 (×2): 1000 [IU]/h via INTRAVENOUS
  Administered 2013-06-09 – 2013-06-11 (×2): 1150 [IU]/h via INTRAVENOUS
  Filled 2013-06-09 (×7): qty 250

## 2013-06-09 MED ORDER — LOSARTAN POTASSIUM 50 MG PO TABS
50.0000 mg | ORAL_TABLET | Freq: Every day | ORAL | Status: DC
  Administered 2013-06-09 – 2013-06-13 (×5): 50 mg via ORAL
  Filled 2013-06-09 (×6): qty 1

## 2013-06-09 MED ORDER — CLOPIDOGREL BISULFATE 75 MG PO TABS
75.0000 mg | ORAL_TABLET | Freq: Every day | ORAL | Status: DC
  Administered 2013-06-09 – 2013-06-14 (×6): 75 mg via ORAL
  Filled 2013-06-09 (×9): qty 1

## 2013-06-09 MED ORDER — ASPIRIN EC 81 MG PO TBEC
81.0000 mg | DELAYED_RELEASE_TABLET | Freq: Every day | ORAL | Status: DC
  Administered 2013-06-10 – 2013-06-14 (×4): 81 mg via ORAL
  Filled 2013-06-09 (×5): qty 1

## 2013-06-09 MED ORDER — ASPIRIN 81 MG PO CHEW
324.0000 mg | CHEWABLE_TABLET | Freq: Once | ORAL | Status: AC
  Administered 2013-06-09: 324 mg via ORAL
  Filled 2013-06-09: qty 4

## 2013-06-09 MED ORDER — MECLIZINE HCL 25 MG PO TABS
25.0000 mg | ORAL_TABLET | Freq: Three times a day (TID) | ORAL | Status: DC | PRN
  Filled 2013-06-09: qty 1

## 2013-06-09 MED ORDER — NITROGLYCERIN 0.4 MG SL SUBL
0.4000 mg | SUBLINGUAL_TABLET | SUBLINGUAL | Status: DC | PRN
  Administered 2013-06-09: 0.4 mg via SUBLINGUAL
  Filled 2013-06-09: qty 1

## 2013-06-09 MED ORDER — EZETIMIBE 10 MG PO TABS
10.0000 mg | ORAL_TABLET | Freq: Every day | ORAL | Status: DC
  Administered 2013-06-09 – 2013-06-14 (×6): 10 mg via ORAL
  Filled 2013-06-09 (×7): qty 1

## 2013-06-09 MED ORDER — PANTOPRAZOLE SODIUM 40 MG PO TBEC
40.0000 mg | DELAYED_RELEASE_TABLET | Freq: Every day | ORAL | Status: DC
  Administered 2013-06-09 – 2013-06-14 (×5): 40 mg via ORAL
  Filled 2013-06-09 (×4): qty 1

## 2013-06-09 MED ORDER — NITROGLYCERIN 0.4 MG SL SUBL: 0.4000 mg | SUBLINGUAL_TABLET | SUBLINGUAL | Status: DC | PRN

## 2013-06-09 MED ORDER — ASPIRIN 81 MG PO TABS: 81.0000 mg | ORAL_TABLET | Freq: Every day | ORAL | Status: DC

## 2013-06-09 MED ORDER — HEPARIN BOLUS VIA INFUSION
1000.0000 [IU] | Freq: Once | INTRAVENOUS | Status: AC
  Administered 2013-06-09: 1000 [IU] via INTRAVENOUS
  Filled 2013-06-09: qty 1000

## 2013-06-09 MED ORDER — AMLODIPINE BESYLATE 5 MG PO TABS
5.0000 mg | ORAL_TABLET | Freq: Every day | ORAL | Status: DC
  Administered 2013-06-09 – 2013-06-14 (×6): 5 mg via ORAL
  Filled 2013-06-09 (×6): qty 1

## 2013-06-09 MED ORDER — HEPARIN BOLUS VIA INFUSION
4000.0000 [IU] | Freq: Once | INTRAVENOUS | Status: AC
  Administered 2013-06-09: 4000 [IU] via INTRAVENOUS
  Filled 2013-06-09: qty 4000

## 2013-06-09 MED ORDER — ATORVASTATIN CALCIUM 80 MG PO TABS
80.0000 mg | ORAL_TABLET | Freq: Every day | ORAL | Status: DC
  Administered 2013-06-09 – 2013-06-14 (×6): 80 mg via ORAL
  Filled 2013-06-09 (×7): qty 1

## 2013-06-09 MED ORDER — SODIUM CHLORIDE 0.9 % IV SOLN
1000.0000 mL | INTRAVENOUS | Status: DC
  Administered 2013-06-09: 1000 mL via INTRAVENOUS

## 2013-06-09 MED ORDER — CARVEDILOL 6.25 MG PO TABS
6.2500 mg | ORAL_TABLET | Freq: Every day | ORAL | Status: DC
  Administered 2013-06-09 – 2013-06-14 (×6): 6.25 mg via ORAL
  Filled 2013-06-09 (×7): qty 1

## 2013-06-09 MED ORDER — FAMOTIDINE 10 MG PO TABS
10.0000 mg | ORAL_TABLET | Freq: Every day | ORAL | Status: DC
  Administered 2013-06-09 – 2013-06-14 (×6): 10 mg via ORAL
  Filled 2013-06-09 (×6): qty 1

## 2013-06-09 MED ORDER — ALBUTEROL SULFATE (2.5 MG/3ML) 0.083% IN NEBU
2.5000 mg | INHALATION_SOLUTION | Freq: Four times a day (QID) | RESPIRATORY_TRACT | Status: DC
  Administered 2013-06-09 – 2013-06-11 (×8): 2.5 mg via RESPIRATORY_TRACT
  Filled 2013-06-09 (×8): qty 3

## 2013-06-09 NOTE — Care Management Note (Signed)
    Page 1 of 1   06/09/2013     3:16:25 PM   CARE MANAGEMENT NOTE 06/09/2013  Patient:  [name],Jacob D   Account Number:  [id number]  Date Initiated:  06/09/2013  Documentation initiated by:  Junius CreamerWELL,DEBBIE  Subjective/Objective Assessment:   adm w angina     Action/Plan:   lives alone, pcp dr Oliver Barrejames john   Anticipated DC Date:     Anticipated DC Plan:           Choice offered to / List presented to:             Status of service:   Medicare Important Message given?   (If response is "NO", the following Medicare IM given date fields will be blank) Date Medicare IM given:   Date Additional Medicare IM given:    Discharge Disposition:    Per UR Regulation:  Reviewed for med. necessity/level of care/duration of stay  If discussed at Long Length of Stay Meetings, dates discussed:    Comments:

## 2013-06-09 NOTE — ED Notes (Signed)
Pt reports hx of CABG, MI and pacemaker. Pt reports he saw his cardiologist 3 weeks ago and was told to come to ED to be evaluated for chest pain. Chest pain x3 weeks. Pain increases at night. At present pain 3/10. Chronic SOB.

## 2013-06-09 NOTE — ED Provider Notes (Signed)
CSN: [id number]     Arrival date & time 06/09/13  4098 History   First MD Initiated Contact with Patient 06/09/13 1002     Chief Complaint  Patient presents with  . Chest Pain    Patient is a 68 y.o. male presenting with chest pain. The history is provided by the patient.  Chest Pain Pain location:  Substernal area, L chest and R chest Pain quality: pressure and tightness   Pain radiates to:  Does not radiate Pain radiates to the back: no   Pain severity:  Moderate Duration:  3 weeks Timing:  Intermittent Progression since onset: Symptoms last minutes to hours frequently throughout the day. Relieved by:  Nitroglycerin Associated symptoms: cough   Associated symptoms: no back pain, no fatigue, no fever, no nausea, no shortness of breath, no syncope and not vomiting   Risk factors: coronary artery disease and smoking   Pt continues to smoke.  He saw Dr Shelle Iron a few weeks ago and was told to go the ED to be evaluated further.  At that time the patient was experiencing his chest discomfort and was also noticing worsening dyspnea on exertion.   Dr. Shelle Iron was concerned that this could be related to his coronary artery disease as apparently he does not have significant COPD according to pulmonary function tests.   Pt decided not to go but came to the ED today because the symptoms have not resolved.  Right now the patient states he has a little bit of pressure but it is very mild.  Past Medical History  Diagnosis Date  . Cerebellar stroke, acute   . IHD (ischemic heart disease)   . Hiccoughs   . Diabetes mellitus   . Hyperlipidemia   . Vitamin B12 deficiency   . DIABETES MELLITUS, TYPE II 09/20/2008    Qualifier: Diagnosis of  By: Flonnie Overman    . HYPERTENSION 09/20/2008    Qualifier: Diagnosis of  By: Flonnie Overman    . CAD 09/20/2008    Qualifier: Diagnosis of  By: Flonnie Overman    . BRADYCARDIA 09/20/2008    Qualifier: Diagnosis of  By: Flonnie Overman    . 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 TIA 09/20/2008    Qualifier: Diagnosis of  By: Flonnie Overman    . PSORIASIS 09/20/2008    Qualifier: Diagnosis of  By: Flonnie Overman    . Cardiac pacemaker in situ 10/08/2008    Qualifier: Diagnosis of  By: Ladona Ridgel, MD, Jerrell Mylar   . Benign hypertensive heart disease without heart failure 08/03/2010  . Hx of CABG 08/03/2010  . Intractable hiccoughs 08/03/2010  . Tobacco abuse 08/03/2010  . Vascular claudication 11/15/2010  . Dyslipidemia 09/05/2011  . GERD (gastroesophageal reflux disease) 11/06/2011  . Nephrolithiasis 11/06/2011  . Carotid stenosis 11/06/2011    Bilat 60-80% 2010  . Pacemaker   . Complication of anesthesia     had hallucinations after anesthesia  . Myocardial infarction   . Pneumonia     hx of  . Shortness of breath     ambulation  . CHF (congestive heart failure)   . DVT (deep venous thrombosis)    Past Surgical History  Procedure Laterality Date  . Coronary artery bypass graft    . Coronary angioplasty  09/18/2006    WELL PRESERVED LEFT VENTRICULAR SYSTOLIC FUNCTION. THERE APPEARS TBE SOME HYPOKINESIS OF THE POSTERIOR WALL. EF 60%  .  Insert / replace / remove pacemaker    . Femoral-femoral bypass graft  02/25/2012    Procedure: BYPASS GRAFT FEMORAL-FEMORAL ARTERY;  Surgeon: Fransisco Hertz, MD;  Location: [facility] OR;  Service: Vascular;  Laterality: N/A;  RIGHT TO LEFT Using 8mm x 30 cm Hemashield Graft  . Femoral-tibial bypass graft  02/25/2012    Procedure: BYPASS GRAFT FEMORAL-TIBIAL ARTERY;  Surgeon: Fransisco Hertz, MD;  Location: [facility] OR;  Service: Vascular;  Laterality: Left;  . Endarterectomy femoral  02/25/2012    Procedure: ENDARTERECTOMY FEMORAL;  Surgeon: Fransisco Hertz, MD;  Location: [facility] OR;  Service: Vascular;  Laterality: Bilateral;  . Patch angioplasty  02/25/2012    Procedure: PATCH ANGIOPLASTY;  Surgeon: Fransisco Hertz, MD;  Location: [facility] OR;  Service: Vascular;  Laterality: Bilateral;  using   1 cm x 6 cm vascu guard patch angioplasty  . Intraoperative arteriogram  02/25/2012    Procedure: INTRA OPERATIVE ARTERIOGRAM;  Surgeon: Fransisco Hertz, MD;  Location: [facility] OR;  Service: Vascular;  Laterality: Left;   Family History  Problem Relation Age of Onset  . COPD Mother   . Diabetes Mother   . Heart disease Mother   . Hyperlipidemia Mother   . Hypertension Mother   . Heart attack Mother   . Coronary artery disease Father   . Heart disease Father     before age 54  . Hyperlipidemia Father   . Hypertension Father   . Heart attack Father    History  Substance Use Topics  . Smoking status: Current Every Day Smoker -- 1.00 packs/day for 54 years    Types: Cigarettes  . Smokeless tobacco: Former Neurosurgeon    Quit date: 02/18/2012  . Alcohol Use: No    Review of Systems  Constitutional: Negative for fever and fatigue.  Respiratory: Positive for cough. Negative for shortness of breath.   Cardiovascular: Positive for chest pain. Negative for syncope.  Gastrointestinal: Negative for nausea and vomiting.  Musculoskeletal: Negative for back pain.  All other systems reviewed and are negative.      Allergies  Imdur and Metformin and related  Home Medications   Current Outpatient Rx  Name  Route  Sig  Dispense  Refill  . amLODipine (NORVASC) 5 MG tablet   Oral   Take 5 mg by mouth daily.         Marland Kitchen aspirin 81 MG tablet   Oral   Take 81 mg by mouth daily.           Marland Kitchen atorvastatin (LIPITOR) 80 MG tablet   Oral   Take 80 mg by mouth daily.         . carvedilol (COREG) 6.25 MG tablet   Oral   Take 6.25 mg by mouth daily.          . clopidogrel (PLAVIX) 75 MG tablet   Oral   Take 75 mg by mouth daily with breakfast.         . dexlansoprazole (DEXILANT) 60 MG capsule   Oral   Take 60 mg by mouth daily.         Marland Kitchen ezetimibe (ZETIA) 10 MG tablet   Oral   Take 10 mg by mouth daily.         Marland Kitchen losartan (COZAAR) 50 MG tablet   Oral   Take 50 mg by mouth  daily.         . meclizine (ANTIVERT) 25 MG tablet   Oral  Take 25 mg by mouth 3 (three) times daily as needed for dizziness. Patient has been taking 1 tablet every day         . nitroGLYCERIN (NITROSTAT) 0.4 MG SL tablet   Sublingual   Place 0.4 mg under the tongue every 5 (five) minutes as needed. For chest pain         . ranitidine (ZANTAC) 150 MG tablet   Oral   Take 150 mg by mouth 2 (two) times daily.          BP 137/90  Pulse 86  Temp(Src) 97.8 F (36.6 C) (Oral)  Resp 12  SpO2 100% Physical Exam  Nursing note and vitals reviewed. Constitutional: No distress.  HENT:  Head: Normocephalic and atraumatic.  Right Ear: External ear normal.  Left Ear: External ear normal.  Eyes: Conjunctivae are normal. Right eye exhibits no discharge. Left eye exhibits no discharge. No scleral icterus.  Neck: Neck supple. No tracheal deviation present.  Cardiovascular: Normal rate, regular rhythm and intact distal pulses.   Pulmonary/Chest: Effort normal and breath sounds normal. No stridor. No respiratory distress. He has no wheezes. He has no rales.  Abdominal: Soft. Bowel sounds are normal. He exhibits no distension. There is no tenderness. There is no rebound and no guarding.  Musculoskeletal: He exhibits no edema and no tenderness.  Extremities warm and well-perfused, no cyanosis or edema  Neurological: He is alert. He has normal strength. No cranial nerve deficit (no facial droop, extraocular movements intact, no slurred speech) or sensory deficit. He exhibits normal muscle tone. He displays no seizure activity. Coordination normal.  Skin: Skin is warm and dry. No rash noted.  Psychiatric: He has a normal mood and affect.   Medications  0.9 %  sodium chloride infusion (1,000 mLs Intravenous New Bag/Given 06/09/13 1042)  nitroGLYCERIN (NITROSTAT) SL tablet 0.4 mg (0.4 mg Sublingual Given 06/09/13 1044)  heparin ADULT infusion 100 units/mL (25000 units/250 mL) (1,000 Units/hr  Intravenous New Bag/Given 06/09/13 1107)  amLODipine (NORVASC) tablet 5 mg (not administered)  atorvastatin (LIPITOR) tablet 80 mg (not administered)  carvedilol (COREG) tablet 6.25 mg (not administered)  clopidogrel (PLAVIX) tablet 75 mg (not administered)  pantoprazole (PROTONIX) EC tablet 40 mg (not administered)  ezetimibe (ZETIA) tablet 10 mg (not administered)  losartan (COZAAR) tablet 50 mg (not administered)  meclizine (ANTIVERT) tablet 25 mg (not administered)  famotidine (PEPCID) tablet 10 mg (not administered)  aspirin EC tablet 81 mg (not administered)  albuterol (PROVENTIL) (2.5 MG/3ML) 0.083% nebulizer solution 2.5 mg (2.5 mg Nebulization Given 06/09/13 1243)  aspirin chewable tablet 324 mg (324 mg Oral Given 06/09/13 1040)  heparin bolus via infusion 4,000 Units (0 Units Intravenous Stopped 06/09/13 1153)     ED Course  Procedures  Labs Review Labs Reviewed  CBC - Abnormal; Notable for the following:    Hemoglobin 12.8 (*)    RDW 15.9 (*)    All other components within normal limits  COMPREHENSIVE METABOLIC PANEL - Abnormal; Notable for the following:    Glucose, Bld 150 (*)    Creatinine, Ser 1.73 (*)    Albumin 3.4 (*)    GFR calc non Af Amer 40 (*)    GFR calc Af Amer 46 (*)    All other components within normal limits  TSH - Abnormal; Notable for the following:    TSH 4.720 (*)    All other components within normal limits  TROPONIN I - Abnormal; Notable for the following:    Troponin  I 0.52 (*)    All other components within normal limits  I-STAT TROPOININ, ED - Abnormal; Notable for the following:    Troponin i, poc 0.30 (*)    All other components within normal limits  APTT  PROTIME-INR  HEPARIN LEVEL (UNFRACTIONATED)  TROPONIN I  TROPONIN I   Imaging Review Dg Chest Portable 1 View  06/09/2013   CLINICAL DATA:  Chest pain  EXAM: PORTABLE CHEST - 1 VIEW  COMPARISON:  Chest radiograph 10/21/2012  FINDINGS: There changes of median sternotomy for CABG. Left  chest wall dual lead pacer appears stable in the frontal projection. Cardiomegaly is stable. There is pulmonary vascular congestion. There is a hazy opacity at the right lung base and the right costophrenic angle is blunted, suggesting a small right pleural effusion. . There is obscuration of the medial right hemidiaphragm. There are no focal opacities on the left. No visible pleural effusion on the left. The trachea is midline.  IMPRESSION: 1. Cardiomegaly and pulmonary vascular congestion. 2. Small right pleural effusion and right basilar atelectasis.   Electronically Signed   By: Britta Mccreedy M.D.   On: 06/09/2013 10:48     EKG Interpretation   Date/Time:  Tuesday June 09 2013 09:56:26 EDT Ventricular Rate:  91 PR Interval:  238 QRS Duration: 128 QT Interval:  406 QTC Calculation: 499 R Axis:   -8 Text Interpretation:  Atrial-paced rhythm with prolonged AV conduction  with frequent ventricular-paced complexes and with occasional Premature  ventricular complexes Non-specific intra-ventricular conduction block T  wave abnormality, consider inferolateral ischemia , present on prior ECG  but appears more prominent on current ECG Abnormal ECG Confirmed by Brinley Rosete   MD-J, Artur Winningham ([id number]) on 06/09/2013 10:37:21 AM     1044  Consulted [facility] cardiology MDM   Final diagnoses:  Unstable angina    Patient's symptoms are concerning for worsening angina. He does have known history of coronary artery disease and does continue to smoke. The patient's troponin is slightly elevated at 0.3. I have ordered a heparin drip and we'll give him nitroglycerin spray.  Consider nitroglycerin drip if the symptoms persist.  Will consult cardiology considering his symptoms and elevated troponin level.  Pt was seen by Dr Elease Hashimoto.  Plan on admission to [facility] hospital for further treatment.    Celene Kras, MD 06/09/13 [phone]

## 2013-06-09 NOTE — H&P (Signed)
CONSULT NOTE  Date: 06/09/2013               Patient Name:  [name] MRN: [id number]  DOB: June 28, 1945 Age / Sex: 68 y.o., male        PCP: Oliver Barre Primary Cardiologist: Patty Sermons            Referring Physician: Gwenyth Allegra              Reason for Consult: Chest pain , hx of CAD             History of Present Illness: Patient is a 68 y.o. male with a PMHx of CAD, CABG, CVA, CKD, , HTN, hyperlipidemia,  pacer smoking , who was admitted to [facility] on 06/09/2013 for evaluation of  Chest pain .   Patient has had progressive chest tightness and dyspnea for the past 3 weeks.  He says it is worse at night.  Not able to walk more than 20 feet because of dyspnea.  Smoking 1 ppd.   Pt used to work in a [employer].    Medications: Outpatient medications:  (Not in a hospital admission)  Current medications: Current Facility-Administered Medications  Medication Dose Route Frequency Provider Last Rate Last Dose  . 0.9 %  sodium chloride infusion  1,000 mL Intravenous Continuous Celene Kras, MD 20 mL/hr at 06/09/13 1042 1,000 mL at 06/09/13 1042  . heparin ADULT infusion 100 units/mL (25000 units/250 mL)  1,000 Units/hr Intravenous Continuous Laurence Slate, RPH 10 mL/hr at 06/09/13 1107 1,000 Units/hr at 06/09/13 1107  . nitroGLYCERIN (NITROSTAT) SL tablet 0.4 mg  0.4 mg Sublingual Q5 min PRN Celene Kras, MD   0.4 mg at 06/09/13 1044   Current Outpatient Prescriptions  Medication Sig Dispense Refill  . amLODipine (NORVASC) 5 MG tablet Take 5 mg by mouth daily.      Marland Kitchen aspirin 81 MG tablet Take 81 mg by mouth daily.        Marland Kitchen atorvastatin (LIPITOR) 80 MG tablet Take 80 mg by mouth daily.      . carvedilol (COREG) 6.25 MG tablet Take 6.25 mg by mouth daily.       . clopidogrel (PLAVIX) 75 MG tablet Take 75 mg by mouth daily with breakfast.      . dexlansoprazole (DEXILANT) 60 MG capsule Take 60 mg by mouth daily.      Marland Kitchen ezetimibe (ZETIA) 10 MG tablet Take 10 mg by mouth daily.       Marland Kitchen losartan (COZAAR) 50 MG tablet Take 50 mg by mouth daily.      . meclizine (ANTIVERT) 25 MG tablet Take 25 mg by mouth 3 (three) times daily as needed for dizziness. Patient has been taking 1 tablet every day      . nitroGLYCERIN (NITROSTAT) 0.4 MG SL tablet Place 0.4 mg under the tongue every 5 (five) minutes as needed. For chest pain      . ranitidine (ZANTAC) 150 MG tablet Take 150 mg by mouth 2 (two) times daily.         Allergies  Allergen Reactions  . Imdur [Isosorbide Mononitrate]     HEADACHES   . Metformin And Related     ABDOMINAL CRAMPS     Past Medical History  Diagnosis Date  . Cerebellar stroke, acute   . IHD (ischemic heart disease)   . Hiccoughs   . Diabetes mellitus   . Hyperlipidemia   . Vitamin B12 deficiency   .  DIABETES MELLITUS, TYPE II 09/20/2008    Qualifier: Diagnosis of  By: Flonnie OvermanMcLean, Monica    . HYPERTENSION 09/20/2008    Qualifier: Diagnosis of  By: Flonnie OvermanMcLean, Monica    . CAD 09/20/2008    Qualifier: Diagnosis of  By: Flonnie OvermanMcLean, Monica    . BRADYCARDIA 09/20/2008    Qualifier: Diagnosis of  By: Flonnie OvermanMcLean, Monica    . 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 TIA 09/20/2008    Qualifier: Diagnosis of  By: Flonnie OvermanMcLean, Monica    . PSORIASIS 09/20/2008    Qualifier: Diagnosis of  By: Flonnie OvermanMcLean, Monica    . Cardiac pacemaker in situ 10/08/2008    Qualifier: Diagnosis of  By: Ladona Ridgelaylor, MD, Jerrell MylarFACC, Gregg William   . Benign hypertensive heart disease without heart failure 08/03/2010  . Hx of CABG 08/03/2010  . Intractable hiccoughs 08/03/2010  . Tobacco abuse 08/03/2010  . Vascular claudication 11/15/2010  . Dyslipidemia 09/05/2011  . GERD (gastroesophageal reflux disease) 11/06/2011  . Nephrolithiasis 11/06/2011  . Carotid stenosis 11/06/2011    Bilat 60-80% 2010  . Pacemaker   . Complication of anesthesia     had hallucinations after anesthesia  . Myocardial infarction   . Pneumonia     hx of  . Shortness of breath      ambulation  . CHF (congestive heart failure)   . DVT (deep venous thrombosis)     Past Surgical History  Procedure Laterality Date  . Coronary artery bypass graft    . Coronary angioplasty  09/18/2006    WELL PRESERVED LEFT VENTRICULAR SYSTOLIC FUNCTION. THERE APPEARS TBE SOME HYPOKINESIS OF THE POSTERIOR WALL. EF 60%  . Insert / replace / remove pacemaker    . Femoral-femoral bypass graft  02/25/2012    Procedure: BYPASS GRAFT FEMORAL-FEMORAL ARTERY;  Surgeon: Fransisco HertzBrian L Chen, MD;  Location: [facility] OR;  Service: Vascular;  Laterality: N/A;  RIGHT TO LEFT Using 8mm x 30 cm Hemashield Graft  . Femoral-tibial bypass graft  02/25/2012    Procedure: BYPASS GRAFT FEMORAL-TIBIAL ARTERY;  Surgeon: Fransisco HertzBrian L Chen, MD;  [facility];  Service: Vascular;  Laterality: Left;  . Endarterectomy femoral  02/25/2012    Procedure: ENDARTERECTOMY FEMORAL;  Surgeon: Fransisco HertzBrian L Chen, MD;  [facility];  Service: Vascular;  Laterality: Bilateral;  . Patch angioplasty  02/25/2012    Procedure: PATCH ANGIOPLASTY;  Surgeon: Fransisco HertzBrian L Chen, MD;  [facility];  Service: Vascular;  Laterality: Bilateral;  using  1 cm x 6 cm vascu guard patch angioplasty  . Intraoperative arteriogram  02/25/2012    Procedure: INTRA OPERATIVE ARTERIOGRAM;  Surgeon: Fransisco HertzBrian L Chen, MD;  [facility];  Service: Vascular;  Laterality: Left;    Family History  Problem Relation Age of Onset  . COPD Mother   . Diabetes Mother   . Heart disease Mother   . Hyperlipidemia Mother   . Hypertension Mother   . Heart attack Mother   . Coronary artery disease Father   . Heart disease Father     before age 66  . Hyperlipidemia Father   . Hypertension Father   . Heart attack Father     Social History:  reports that he has been smoking Cigarettes.  He has a 54 pack-year smoking history. He quit smokeless tobacco use about 15 months ago. He reports that he does not drink alcohol or use illicit drugs.   Review of Systems: Constitutional:  denies fever, chills, diaphoresis, appetite change and fatigue.  HEENT: denies photophobia, eye pain, redness, hearing loss, ear pain, congestion, sore throat, rhinorrhea, sneezing, neck pain, neck stiffness and tinnitus.  Respiratory: admits to SOB, DOE, cough, ( nonproductive cough)  chest tightness, and +  Lots of  wheezing.  Cardiovascular: denies chest pain, palpitations and leg swelling.  Gastrointestinal: denies nausea, vomiting, abdominal pain, diarrhea, constipation, blood in stool.  Genitourinary: denies dysuria, urgency, frequency, hematuria, flank pain and difficulty urinating.  Musculoskeletal: denies  myalgias, back pain, joint swelling, arthralgias and gait problem.   Skin: denies pallor, rash and wound.  Neurological: denies dizziness, seizures, syncope, weakness, light-headedness, numbness and headaches.   Hematological: denies adenopathy, easy bruising, personal or family bleeding history.  Psychiatric/ Behavioral: denies suicidal ideation, mood changes, confusion, nervousness, sleep disturbance and agitation.    Physical Exam: BP 155/106  Pulse 73  Temp(Src) 97.8 F (36.6 C) (Oral)  Resp 19  SpO2 99%  Wt Readings from Last 3 Encounters:  05/14/13 176 lb 3.2 oz (79.924 kg)  05/08/13 170 lb (77.111 kg)  03/11/13 173 lb (78.472 kg)    General: Vital signs reviewed and noted.  Chronically ill appearing man  Head: Normocephalic, atraumatic, sclera anicteric,   Neck: Supple. Negative for carotid bruits. No JVD   Lungs:  Bilateral wheezing,   Heart: RRR with S1 S2  Abdomen:  Soft, non-tender, non-distended with normoactive bowel sounds. No hepatomegaly. No rebound/guarding. No obvious abdominal masses   MSK: Strength and the appear normal for age.   Extremities: No clubbing or cyanosis. No edema.  Distal pedal pulses trace  Neurologic: Alert and oriented X 3. Moves all extremities spontaneously.  Psych: Occasionally slow to answer questions.  Girlfriend answered some  questions for him    Lab results: Basic Metabolic Panel:  Recent Labs Lab 06/09/13 1037  NA 139  K 3.9  CL 96  CO2 25  GLUCOSE 150*  BUN 13  CREATININE 1.73*  CALCIUM 9.6    Liver Function Tests:  Recent Labs Lab 06/09/13 1037  AST 12  ALT 7  ALKPHOS 113  BILITOT 0.9  PROT 7.8  ALBUMIN 3.4*   No results found for this basename: LIPASE, AMYLASE,  in the last 168 hours No results found for this basename: AMMONIA,  in the last 168 hours  CBC:  Recent Labs Lab 06/09/13 1015  WBC 6.4  HGB 12.8*  HCT 39.2  MCV 83.2  PLT 314    Cardiac Enzymes: No results found for this basename: CKTOTAL, CKMB, CKMBINDEX, TROPONINI,  in the last 168 hours  BNP: No components found with this basename: POCBNP,   CBG: No results found for this basename: GLUCAP,  in the last 168 hours  Coagulation Studies:  Recent Labs  06/09/13 1037  LABPROT 14.6  INR 1.16    ECG:  Atrial pacing ,  PVCs, ST depression in inf. And lateral leads.    Imaging: Dg Chest Portable 1 View  06/09/2013   CLINICAL DATA:  Chest pain  EXAM: PORTABLE CHEST - 1 VIEW  COMPARISON:  Chest radiograph 10/21/2012  FINDINGS: There changes of median sternotomy for CABG. Left chest wall dual lead pacer appears stable in the frontal projection. Cardiomegaly is stable. There is pulmonary vascular congestion. There is a hazy opacity at the right lung base and the right costophrenic angle is blunted, suggesting a small right pleural effusion. . There is obscuration of the medial right hemidiaphragm. There are no focal opacities on the left.  No visible pleural effusion on the left. The trachea is midline.  IMPRESSION: 1. Cardiomegaly and pulmonary vascular congestion. 2. Small right pleural effusion and right basilar atelectasis.   Electronically Signed   By: Britta Mccreedy M.D.   On: 06/09/2013 10:48        Assessment & Plan:  1. Chest pain :  Pt presents with some chest discomfort and severe shortness of  breath. This happens particularly at night. He has lots of wheezing although recent notes from the pulmonologist suggest that his PFTs are not all that bad.  We will admit him to the hospital. Anticipate transferring him to [facility]. We'll continue to collect cardiac enzymes.  His point-of-care enzymes are minimally elevated. We will need to verify this with regular troponin levels performed in the [facility].Marland Kitchen    We'll consider cardiac cavitation versus stress testing based on his cardiac enzymes and his hospital course. He has ongoing cigarette smoking and is at high risk of developing further coronary artery disease or blockage of his grafts.  I have strongly advised him to stop smoking.  He does not seem willing to even try to stop smoking.    2. HTN:  Continue current meds.  3. COPD:  He has seen a pulmonologist and reportedly his pulmonary function tests are not all that abnormal. He has moderately tight wheezing on exam and on convinced that at least some of his shortness of breath is due to COPD. Will start on nebulizers.  4. Peripheral vascular disease: Continue current medications . Hyperlipidemia-continue medications. We'll check fasting lipids tomorrow.  5. Diabetes mellitus:  6. Pacemaker-appears to be functioning normally. The patient's girlfriend states that the case with a pacemaker feels very hot. He has mentioned this to our device clinic and the pacemaker was found to be functioning normally.  7. History of stroke: I've advised him to stop smoking. He's not having any acute stroke symptoms at this time.  Vesta Mixer, Montez Hageman., MD, [facility] 06/09/2013, 11:45 AM Office - [phone] Pager [phone]

## 2013-06-09 NOTE — Progress Notes (Signed)
ANTICOAGULATION CONSULT NOTE - Initial Consult  Pharmacy Consult for heparin Indication: chest pain/ACS  Allergies  Allergen Reactions  . Imdur [Isosorbide Mononitrate]     HEADACHES   . Metformin And Related     ABDOMINAL CRAMPS    Patient Measurements: Weight: 79.9kg on 05/14/13 IBW: 66kg Heparin Dosing Weight: 80kg  Vital Signs: Temp: 97.8 F (36.6 C) (04/07 0957) Temp src: Oral (04/07 0957) BP: 139/91 mmHg (04/07 0957) Pulse Rate: 79 (04/07 0957)  Labs:  Recent Labs  06/09/13 1015  HGB 12.8*  HCT 39.2  PLT 314   Medical History: Past Medical History  Diagnosis Date  . Cerebellar stroke, acute   . IHD (ischemic heart disease)   . Hiccoughs   . Diabetes mellitus   . Hyperlipidemia   . Vitamin B12 deficiency   . DIABETES MELLITUS, TYPE II 09/20/2008    Qualifier: Diagnosis of  By: Flonnie OvermanMcLean, Monica    . HYPERTENSION 09/20/2008    Qualifier: Diagnosis of  By: Flonnie OvermanMcLean, Monica    . CAD 09/20/2008    Qualifier: Diagnosis of  By: Flonnie OvermanMcLean, Monica    . BRADYCARDIA 09/20/2008    Qualifier: Diagnosis of  By: Flonnie OvermanMcLean, Monica    . 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 TIA 09/20/2008    Qualifier: Diagnosis of  By: Flonnie OvermanMcLean, Monica    . PSORIASIS 09/20/2008    Qualifier: Diagnosis of  By: Flonnie OvermanMcLean, Monica    . Cardiac pacemaker in situ 10/08/2008    Qualifier: Diagnosis of  By: Ladona Ridgelaylor, MD, Jerrell MylarFACC, Gregg William   . Benign hypertensive heart disease without heart failure 08/03/2010  . Hx of CABG 08/03/2010  . Intractable hiccoughs 08/03/2010  . Tobacco abuse 08/03/2010  . Vascular claudication 11/15/2010  . Dyslipidemia 09/05/2011  . GERD (gastroesophageal reflux disease) 11/06/2011  . Nephrolithiasis 11/06/2011  . Carotid stenosis 11/06/2011    Bilat 60-80% 2010  . Pacemaker   . Complication of anesthesia     had hallucinations after anesthesia  . Myocardial infarction   . Pneumonia     hx of  . Shortness of breath     ambulation   . CHF (congestive heart failure)   . DVT (deep venous thrombosis)     Medications:  Scheduled:   Infusions:  . sodium chloride 1,000 mL (06/09/13 1042)    Assessment: [age] yoM admitted 4/7 with c/o chest pain x 3 weeks. Pt with Hx of CABG in 2010, prior MI, prior CVAs, PAD, CKD, and a pacemaker. ECO performed on 11/07/12 showed that his left ventricular ejection fraction is down to 30-35% with severe LVH, moderate aortic stenosis, and mild mitral regurgitation. Patient noted to be on ASA 81mg  daily and clopidogrel 75mg  daily. Pharmacy has been consulted to dose heparin for r/o ACS.  Baseline INR has been ordered  Hgb 12.8, plts ok  Today's SCr is in process, baseline appears ~1.6 w baseline CrCl ~50 mL/min  Initial troponins slightly elevated to 0.3  No ST changes were noted on EKG  Goal of Therapy:  Heparin level 0.3-0.7 units/ml Monitor platelets by anticoagulation protocol: Yes   Plan:  - heparin bolus of 4000 units x1 - heparin gtt at 1000 units/hr - heparin level at 1800 - daily heparin level and CBC - monitor for bleeding - pharmacy will f/u daily  Thank you for the consult.  Tomi BambergerJesse Jameia Makris, PharmD, BCPS Pager: [phone] Pharmacy: [phone] 06/09/2013 10:58 AM

## 2013-06-09 NOTE — Progress Notes (Signed)
ANTICOAGULATION CONSULT NOTE - Follow Up Consult  Pharmacy Consult for heparin Indication: chest pain/ACS  Allergies  Allergen Reactions  . Imdur [Isosorbide Mononitrate]     HEADACHES   . Metformin And Related     ABDOMINAL CRAMPS    Patient Measurements: Height: 5\' 6"  (167.6 cm) Weight: 172 lb 6.4 oz (78.2 kg) IBW/kg (Calculated) : 63.8  Vital Signs: Temp: 98.1 F (36.7 C) (04/07 2014) Temp src: Oral (04/07 2014) BP: 117/66 mmHg (04/07 1900) Pulse Rate: 83 (04/07 1900)  Labs:  Recent Labs  06/09/13 1015 06/09/13 1037 06/09/13 1250 06/09/13 2016  HGB 12.8*  --   --   --   HCT 39.2  --   --   --   PLT 314  --   --   --   APTT  --  30  --   --   LABPROT  --  14.6  --   --   INR  --  1.16  --   --   HEPARINUNFRC  --   --   --  0.21*  CREATININE  --  1.73*  --   --   TROPONINI  --   --  0.52*  --     Estimated Creatinine Clearance: 41.9 ml/min (by C-G formula based on Cr of 1.73).   Medications:  Infusions:  . sodium chloride 1,000 mL (06/09/13 1900)  . heparin 1,000 Units/hr (06/09/13 1900)    Assessment: 68 y/[facility]. [facility]. Heparin level is subtherapeutic at 0.21. No bleeding noted.  Goal of Therapy:  Heparin level 0.3-0.7 units/ml Monitor platelets by anticoagulation protocol: Yes   Plan:  - Heparin 1000 units IV bolus then increase to 1150 units/hr - Daily heparin level and CBC - Monitor for s/sx of bleeding  Collingsworth General HospitalJennifer Raymond, Shannon CityPharm.D., BCPS Clinical Pharmacist Pager: [phone] 06/09/2013 9:22 PM

## 2013-06-09 NOTE — ED Notes (Signed)
Dr Linwood DibblesJon Knapp made aware of abnormal troponin results.

## 2013-06-09 NOTE — ED Notes (Signed)
Dr. Nahser at bedside 

## 2013-06-10 DIAGNOSIS — I369 Nonrheumatic tricuspid valve disorder, unspecified: Secondary | ICD-10-CM

## 2013-06-10 DIAGNOSIS — I214 Non-ST elevation (NSTEMI) myocardial infarction: Secondary | ICD-10-CM

## 2013-06-10 LAB — BASIC METABOLIC PANEL
BUN: 14 mg/dL (ref 6–23)
CALCIUM: 8.7 mg/dL (ref 8.4–10.5)
CO2: 24 meq/L (ref 19–32)
CREATININE: 1.58 mg/dL — AB (ref 0.50–1.35)
Chloride: 101 mEq/L (ref 96–112)
GFR calc Af Amer: 51 mL/min — ABNORMAL LOW (ref >90)
GFR, EST NON AFRICAN AMERICAN: 44 mL/min — AB (ref >90)
Glucose, Bld: 136 mg/dL — ABNORMAL HIGH (ref 70–99)
Potassium: 3.8 mEq/L (ref 3.7–5.3)
SODIUM: 139 meq/L (ref 137–147)

## 2013-06-10 LAB — CBC
HCT: 36 % — ABNORMAL LOW (ref 39.0–52.0)
Hemoglobin: 11.8 g/dL — ABNORMAL LOW (ref 13.0–17.0)
MCH: 27.6 pg (ref 26.0–34.0)
MCHC: 32.8 g/dL (ref 30.0–36.0)
MCV: 84.1 fL (ref 78.0–100.0)
Platelets: 261 10*3/uL (ref 150–400)
RBC: 4.28 MIL/uL (ref 4.22–5.81)
RDW: 16.1 % — ABNORMAL HIGH (ref 11.5–15.5)
WBC: 6.4 10*3/uL (ref 4.0–10.5)

## 2013-06-10 LAB — LIPID PANEL
CHOLESTEROL: 122 mg/dL (ref 0–200)
HDL: 28 mg/dL — ABNORMAL LOW (ref >39)
LDL Cholesterol: 76 mg/dL (ref 0–99)
Total CHOL/HDL Ratio: 4.4 RATIO
Triglycerides: 89 mg/dL (ref <150)
VLDL: 18 mg/dL (ref 0–40)

## 2013-06-10 LAB — HEPARIN LEVEL (UNFRACTIONATED): Heparin Unfractionated: 0.36 IU/mL (ref 0.30–0.70)

## 2013-06-10 LAB — TROPONIN I

## 2013-06-10 MED ORDER — NITROGLYCERIN 2 % TD OINT
1.0000 [in_us] | TOPICAL_OINTMENT | Freq: Three times a day (TID) | TRANSDERMAL | Status: DC
  Administered 2013-06-10 – 2013-06-14 (×13): 1 [in_us] via TOPICAL
  Filled 2013-06-10 (×2): qty 30

## 2013-06-10 NOTE — Progress Notes (Signed)
ANTICOAGULATION CONSULT NOTE - Follow Up Consult  Pharmacy Consult for heparin Indication: chest pain/ACS  Allergies  Allergen Reactions  . Imdur [Isosorbide Mononitrate]     HEADACHES   . Metformin And Related     ABDOMINAL CRAMPS    Patient Measurements: Height: 5\' 6"  (167.6 cm) Weight: 172 lb 6.4 oz (78.2 kg) IBW/kg (Calculated) : 63.8  Vital Signs: Temp: 97.7 F (36.5 C) (04/08 1108) Temp src: Oral (04/08 1108) BP: 147/98 mmHg (04/08 1108) Pulse Rate: 72 (04/08 1108)  Labs:  Recent Labs  06/09/13 1015 06/09/13 1037 06/09/13 1250 06/09/13 2016 06/10/13 0333  HGB 12.8*  --   --   --  11.8*  HCT 39.2  --   --   --  36.0*  PLT 314  --   --   --  261  APTT  --  30  --   --   --   LABPROT  --  14.6  --   --   --   INR  --  1.16  --   --   --   HEPARINUNFRC  --   --   --  0.21* 0.36  CREATININE  --  1.73*  --   --  1.58*  TROPONINI  --   --  0.52*  --   --     Estimated Creatinine Clearance: 45.9 ml/min (by C-G formula based on Cr of 1.58).   Medications:  Infusions:  . sodium chloride 1,000 mL (06/09/13 1900)  . heparin 1,150 Units/hr (06/09/13 2203)    Assessment: 68 y/[facility]. [facility] for further evaluation. Heparin level is therapeutic. No bleeding noted.  Goal of Therapy:  Heparin level 0.3-0.7 units/ml Monitor platelets by anticoagulation protocol: Yes   Plan:  - Continue heparin at 1150 units / hr - Follow up AM  Thank you. Okey RegalLisa Ezekiah Massie, PharmD [phone]  06/10/2013 11:56 AM

## 2013-06-10 NOTE — Clinical Documentation Improvement (Signed)
Possible Clinical Conditions?   Based on your clinical judgment, please clarify cause of Chest Pain:  STEMI Non ST elevation MI Musculoskeletal Chest Pain Other Condition Cannot Clinically Determine   Risk Factors: Patient with a known history of CAD, now with symptoms concerning for worsening angina, elevated troponin I.  Diagnostics: 4/07: 0.52.      0.57.     0.30.  Treatment: Heparin drip, nitroglycerin spray.  Thank You, Marciano SequinWanda Mathews-Bethea,RN,BSN, Clinical Documentation Specialist:  [phone]  [facility]- [facility] Information Management

## 2013-06-10 NOTE — Progress Notes (Signed)
Nutrition Brief Note  Patient identified on the Malnutrition Screening Tool (MST) Report  Wt Readings from Last 15 Encounters:  06/09/13 172 lb 6.4 oz (78.2 kg)  05/14/13 176 lb 3.2 oz (79.924 kg)  05/08/13 170 lb (77.111 kg)  03/11/13 173 lb (78.472 kg)  12/04/12 169 lb (76.658 kg)  11/20/12 170 lb (77.111 kg)  10/21/12 176 lb (79.833 kg)  09/11/12 175 lb 4 oz (79.493 kg)  07/11/12 169 lb 6.4 oz (76.839 kg)  05/23/12 165 lb 2 oz (74.9 kg)  03/14/12 166 lb 3.2 oz (75.388 kg)  02/25/12 169 lb 8.5 oz (76.9 kg)  02/25/12 169 lb 8.5 oz (76.9 kg)  02/22/12 169 lb 5 oz (76.8 kg)  02/18/12 167 lb 5.3 oz (75.9 kg)    Body mass index is 27.84 kg/(m^2). Patient meets criteria for overweight based on current BMI.   Current diet order is heart healthy, patient is consuming approximately 50% of meals at this time. Labs and medications reviewed.   Pt with variable wt hx.  Reports wt loss of ~5 lbs over the past few weeks.  Wt loss is not significant for time frame. Pt did not eat well at lunch today- fatigue, but has several snacks at bedside. No nutrition interventions warranted at this time. If nutrition issues arise, please consult RD.   Loyce DysKacie Gerrica Cygan, MS RD LDN Clinical Inpatient Dietitian Pager: [phone] Weekend/After hours pager: [phone]

## 2013-06-10 NOTE — Progress Notes (Signed)
Patient Name: [name] Date of Encounter: 06/10/2013     Active Problems:   Unstable angina    SUBJECTIVE  Patient complains of chest tightness and shortness of breath.  He has an extensive past vascular history.  He has a history of coronary disease, status post CABG, status post pacemaker, old cerebrovascular accident, chronic kidney disease, hypertension, hyperlipidemia, and ongoing cigarette smoking.  He smokes one pack of cigarettes a day.  Initial troponin is mildly elevated.  EKG shows significant ST-T depression in lateral leads.  Intermittent atrial and ventricular paced beats are seen.  He presently is on triple anticoagulation  CURRENT MEDS . albuterol  2.5 mg Nebulization Q6H  . amLODipine  5 mg Oral Daily  . aspirin EC  81 mg Oral Daily  . atorvastatin  80 mg Oral q1800  . carvedilol  6.25 mg Oral Q supper  . clopidogrel  75 mg Oral Q breakfast  . ezetimibe  10 mg Oral Daily  . famotidine  10 mg Oral Daily  . losartan  50 mg Oral Daily  . nitroGLYCERIN  1 inch Topical 3 times per day  . pantoprazole  40 mg Oral Daily    OBJECTIVE  Filed Vitals:   06/10/13 0833 06/10/13 0941 06/10/13 1046 06/10/13 1108  BP: 165/122  135/76 147/98  Pulse: 82  82 72  Temp: 98.1 F (36.7 C)   97.7 F (36.5 C)  TempSrc: Oral   Oral  Resp: 13  21 19   Height:      Weight:      SpO2: 96% 96% 96% 99%    Intake/Output Summary (Last 24 hours) at 06/10/13 1213 Last data filed at 06/10/13 1100  Gross per 24 hour  Intake    926 ml  Output    500 ml  Net    426 ml   Filed Weights   06/09/13 1510  Weight: 172 lb 6.4 oz (78.2 kg)    PHYSICAL EXAM  General: Pleasant, lying quietly in bed with head of bed elevated at 45 Neuro: Alert and oriented X 3. Moves all extremities spontaneously. Psych: Normal affect. HEENT:  Normal  Neck: Supple without bruits or JVD. Lungs:  Resp regular and unlabored, CTA. Heart: RRR no s3, s4, or murmurs. Abdomen: Soft, non-tender,  non-distended, BS + x 4.  Extremities: No clubbing, cyanosis or edema.  Pedal pulses are equivocal bilaterally.   Accessory Clinical Findings  CBC  Recent Labs  06/09/13 1015 06/10/13 0333  WBC 6.4 6.4  HGB 12.8* 11.8*  HCT 39.2 36.0*  MCV 83.2 84.1  PLT 314 261   Basic Metabolic Panel  Recent Labs  06/09/13 1037 06/10/13 0333  NA 139 139  K 3.9 3.8  CL 96 101  CO2 25 24  GLUCOSE 150* 136*  BUN 13 14  CREATININE 1.73* 1.58*  CALCIUM 9.6 8.7   Liver Function Tests  Recent Labs  06/09/13 1037  AST 12  ALT 7  ALKPHOS 113  BILITOT 0.9  PROT 7.8  ALBUMIN 3.4*   No results found for this basename: LIPASE, AMYLASE,  in the last 72 hours Cardiac Enzymes  Recent Labs  06/09/13 1250  TROPONINI 0.52*   BNP No components found with this basename: POCBNP,  D-Dimer No results found for this basename: DDIMER,  in the last 72 hours Hemoglobin A1C No results found for this basename: HGBA1C,  in the last 72 hours Fasting Lipid Panel  Recent Labs  06/10/13 0333  CHOL 122  HDL 28*  LDLCALC 76  TRIG 89  CHOLHDL 4.4   Thyroid Function Tests  Recent Labs  06/09/13 1250  TSH 4.720*    TELE  Normal sinus rhythm  ECG  Repeat EKG pending  Radiology/Studies  Dg Chest Portable 1 View  06/09/2013   CLINICAL DATA:  Chest pain  EXAM: PORTABLE CHEST - 1 VIEW  COMPARISON:  Chest radiograph 10/21/2012  FINDINGS: There changes of median sternotomy for CABG. Left chest wall dual lead pacer appears stable in the frontal projection. Cardiomegaly is stable. There is pulmonary vascular congestion. There is a hazy opacity at the right lung base and the right costophrenic angle is blunted, suggesting a small right pleural effusion. . There is obscuration of the medial right hemidiaphragm. There are no focal opacities on the left. No visible pleural effusion on the left. The trachea is midline.  IMPRESSION: 1. Cardiomegaly and pulmonary vascular congestion. 2. Small right  pleural effusion and right basilar atelectasis.   Electronically Signed   By: Britta MccreedySusan  Turner M.D.   On: 06/09/2013 10:48    ASSESSMENT AND PLAN  1.  Non-STEMI 2. status post CABG 3. Hypertension 4. chronic kidney disease 5. Hyperlipidemia 6. dyspnea secondary to acute on chronic systolic heart failure.  Last echocardiogram 11/07/12 showed ejection fraction 30-35%. 7. ongoing tobacco abuse  Plan: Gentle IV hydration today.  Probably left heart catheterization tomorrow if renal function improves.  Update echocardiogram.  Continue IV heparin.  Add nitroglycerin ointment. Serial troponins.  Signed, Cassell Clementhomas Clee Pandit MD

## 2013-06-10 NOTE — Progress Notes (Signed)
*  PRELIMINARY RESULTS* Echocardiogram 2D Echocardiogram has been performed.  Katheren PullerJohanna R Denilson Salminen 06/10/2013, 4:51 PM

## 2013-06-11 ENCOUNTER — Encounter (HOSPITAL_COMMUNITY)
Admission: EM | Disposition: A | Discharge status: Home / Self Care | Payer: Medicare Other | Source: Home / Self Care | Attending: Cardiovascular Disease

## 2013-06-11 DIAGNOSIS — I25810 Atherosclerosis of coronary artery bypass graft(s) without angina pectoris: Secondary | ICD-10-CM

## 2013-06-11 DIAGNOSIS — R4789 Other speech disturbances: Secondary | ICD-10-CM

## 2013-06-11 DIAGNOSIS — R29810 Facial weakness: Secondary | ICD-10-CM

## 2013-06-11 HISTORY — PX: LEFT HEART CATHETERIZATION WITH CORONARY/GRAFT ANGIOGRAM: SHX5450

## 2013-06-11 LAB — CBC
HCT: 35.7 % — ABNORMAL LOW (ref 39.0–52.0)
Hemoglobin: 11.7 g/dL — ABNORMAL LOW (ref 13.0–17.0)
MCH: 27.2 pg (ref 26.0–34.0)
MCHC: 32.8 g/dL (ref 30.0–36.0)
MCV: 83 fL (ref 78.0–100.0)
Platelets: 225 10*3/uL (ref 150–400)
RBC: 4.3 MIL/uL (ref 4.22–5.81)
RDW: 16 % — ABNORMAL HIGH (ref 11.5–15.5)
WBC: 5.4 10*3/uL (ref 4.0–10.5)

## 2013-06-11 LAB — BASIC METABOLIC PANEL
BUN: 14 mg/dL (ref 6–23)
CO2: 25 mEq/L (ref 19–32)
Calcium: 8.9 mg/dL (ref 8.4–10.5)
Chloride: 104 mEq/L (ref 96–112)
Creatinine, Ser: 1.6 mg/dL — ABNORMAL HIGH (ref 0.50–1.35)
GFR, EST AFRICAN AMERICAN: 51 mL/min — AB (ref >90)
GFR, EST NON AFRICAN AMERICAN: 44 mL/min — AB (ref >90)
Glucose, Bld: 114 mg/dL — ABNORMAL HIGH (ref 70–99)
POTASSIUM: 3.5 meq/L — AB (ref 3.7–5.3)
SODIUM: 143 meq/L (ref 137–147)

## 2013-06-11 LAB — GLUCOSE, CAPILLARY: Glucose-Capillary: 199 mg/dL — ABNORMAL HIGH (ref 70–99)

## 2013-06-11 LAB — POCT ACTIVATED CLOTTING TIME: ACTIVATED CLOTTING TIME: 404 s

## 2013-06-11 LAB — HEPARIN LEVEL (UNFRACTIONATED): Heparin Unfractionated: 0.29 IU/mL — ABNORMAL LOW (ref 0.30–0.70)

## 2013-06-11 SURGERY — LEFT HEART CATHETERIZATION WITH CORONARY/GRAFT ANGIOGRAM
Anesthesia: LOCAL

## 2013-06-11 MED ORDER — HYDRALAZINE HCL 20 MG/ML IJ SOLN: 10.0000 mg | INTRAMUSCULAR | Status: DC | PRN

## 2013-06-11 MED ORDER — SODIUM CHLORIDE 0.9 % IV SOLN: 250.0000 mL | INTRAVENOUS | Status: DC | PRN

## 2013-06-11 MED ORDER — HEPARIN SODIUM (PORCINE) 1000 UNIT/ML IJ SOLN
INTRAMUSCULAR | Status: AC
  Filled 2013-06-11: qty 1

## 2013-06-11 MED ORDER — SODIUM CHLORIDE 0.9 % IJ SOLN
3.0000 mL | Freq: Two times a day (BID) | INTRAMUSCULAR | Status: DC
  Administered 2013-06-11: 3 mL via INTRAVENOUS

## 2013-06-11 MED ORDER — LIDOCAINE HCL (PF) 1 % IJ SOLN
INTRAMUSCULAR | Status: AC
  Filled 2013-06-11: qty 30

## 2013-06-11 MED ORDER — NITROGLYCERIN 0.2 MG/ML ON CALL CATH LAB
INTRAVENOUS | Status: AC
  Filled 2013-06-11: qty 1

## 2013-06-11 MED ORDER — VERAPAMIL HCL 2.5 MG/ML IV SOLN
INTRAVENOUS | Status: AC
  Filled 2013-06-11: qty 2

## 2013-06-11 MED ORDER — SODIUM CHLORIDE 0.9 % IJ SOLN: 3.0000 mL | INTRAMUSCULAR | Status: DC | PRN

## 2013-06-11 MED ORDER — SODIUM CHLORIDE 0.9 % IV SOLN
1.7500 mg/kg/h | INTRAVENOUS | Status: AC
  Filled 2013-06-11: qty 250

## 2013-06-11 MED ORDER — BIVALIRUDIN 250 MG IV SOLR
INTRAVENOUS | Status: AC
  Filled 2013-06-11: qty 250

## 2013-06-11 MED ORDER — DIAZEPAM 5 MG PO TABS
5.0000 mg | ORAL_TABLET | ORAL | Status: AC
  Administered 2013-06-11: 5 mg via ORAL
  Filled 2013-06-11: qty 1

## 2013-06-11 MED ORDER — POTASSIUM CHLORIDE 10 MEQ/100ML IV SOLN
10.0000 meq | INTRAVENOUS | Status: AC
  Administered 2013-06-11 (×2): 10 meq via INTRAVENOUS
  Filled 2013-06-11 (×2): qty 100

## 2013-06-11 MED ORDER — ALBUTEROL SULFATE (2.5 MG/3ML) 0.083% IN NEBU
2.5000 mg | INHALATION_SOLUTION | Freq: Three times a day (TID) | RESPIRATORY_TRACT | Status: DC
Start: 2013-06-11 — End: 2013-06-13
  Administered 2013-06-11 – 2013-06-13 (×5): 2.5 mg via RESPIRATORY_TRACT
  Filled 2013-06-11 (×5): qty 3

## 2013-06-11 MED ORDER — FUROSEMIDE 10 MG/ML IJ SOLN
40.0000 mg | Freq: Two times a day (BID) | INTRAMUSCULAR | Status: DC
Start: 2013-06-11 — End: 2013-06-12
  Administered 2013-06-11 – 2013-06-12 (×2): 40 mg via INTRAVENOUS
  Filled 2013-06-11 (×3): qty 4

## 2013-06-11 MED ORDER — HEPARIN (PORCINE) IN NACL 100-0.45 UNIT/ML-% IJ SOLN
1550.0000 [IU]/h | INTRAMUSCULAR | Status: DC
  Administered 2013-06-11: 1250 [IU]/h via INTRAVENOUS
  Administered 2013-06-12: 1550 [IU]/h via INTRAVENOUS
  Filled 2013-06-11 (×3): qty 250

## 2013-06-11 MED ORDER — SODIUM CHLORIDE 0.9 % IV SOLN: 1.0000 mL/kg/h | INTRAVENOUS | Status: DC

## 2013-06-11 MED ORDER — SODIUM CHLORIDE 0.9 % IV SOLN
1.0000 mL/kg/h | INTRAVENOUS | Status: AC
  Administered 2013-06-11: 1 mL/kg/h via INTRAVENOUS

## 2013-06-11 MED ORDER — HEPARIN (PORCINE) IN NACL 2-0.9 UNIT/ML-% IJ SOLN
INTRAMUSCULAR | Status: AC
  Filled 2013-06-11: qty 1500

## 2013-06-11 MED ORDER — ASPIRIN 81 MG PO CHEW
81.0000 mg | CHEWABLE_TABLET | ORAL | Status: AC
  Administered 2013-06-11: 81 mg via ORAL
  Filled 2013-06-11: qty 1

## 2013-06-11 NOTE — Interval H&P Note (Signed)
History and Physical Interval Note:  06/11/2013 9:15 AM  Arnoldo Lenisoger D Howson  has presented today for surgery, with the diagnosis of Unstable Angina; Known CAD-CABG, Severe Ischemic CM.  The various methods of treatment have been discussed with the patient and family. After consideration of risks, benefits and other options for treatment, the patient has consented to  Procedure(s): LEFT HEART CATHETERIZATION WITH CORONARY/GRAFT ANGIOGRAM (N/A) as a surgical intervention .  The patient's history has been reviewed, patient examined, no change in status, stable for surgery.  I have reviewed the patient's chart and labs.  Questions were answered to the patient's satisfaction.     Marykay Lexavid W Lucrecia Mcphearson  Cath Lab Visit (complete for each Cath Lab visit)  Clinical Evaluation Leading to the Procedure:   ACS: yes  Non-ACS:    Anginal Classification: CCS IV  Anti-ischemic medical therapy: Maximal Therapy (2 or more classes of medications)  Non-Invasive Test Results: No non-invasive testing performed  Prior CABG: Previous CABG

## 2013-06-11 NOTE — Progress Notes (Signed)
Called by nursing at 11:00 pm regarding concern for stroke like symptoms. 68 yo male with history of stroke in the past admitted on 4/7 with U/A who underwent cath and PCI today. Per nursing who picked up the patient at 8, she reports at baseline he had slurred speech. However, later this evening she noted R sided facial droop and word finding difficulties. Pt without complaints currently. On exam, he does appear to have some R sided facial droop but his cranial nerves appear to be intact and his MSK exam in upper and lower extremities is normal (did not ambulate). Will ask neurology for assistance. New stroke vs. Exacerbation of prior stroke?

## 2013-06-11 NOTE — Progress Notes (Addendum)
Patient found in room punching holes in the urinal.  Patient reports "I was trying to get the thing open".  Seems to be having difficulty finding words.  NIHSS performed revealed some very mild expressive aphasia when reading sentences and identifying pictures.  Right sided facial droop noted with slurred speech upon initial assessment.  Slurred speech previously identified as the patient's baseline.  Denies pain or numbness in face or extremities.  Other neuro assessments benign.  CBG 199.  Patient has history of CVA but no report of neuro symptoms or residuals noted. Dr. Adolm JosephWhitlock notified of concerns and is at bedside to assess the patient.  Will continue to monitor.

## 2013-06-11 NOTE — Consult Note (Addendum)
NEURO HOSPITALIST CONSULT NOTE    Reason for Consult: right face droop, transient word finding difficulty.  HPI:                                                                                                                                          [name] is an 68 y.o. male with a past medical history significant for HTN, hyperlipidemia, type 2 DM, coronary disease status post CABG in 2010, current smoking, status post pacemaker placement, severe PAD status post a lateral femoral artery endarterectomies and fem-fem bypass, chronic congestive heart failure, bilateral carotid disease, TIAs, ischemic stroke with residual dysarthria, who was admitted on 4/7 with U/A and underwent cath and PCI today, noted to have word finding difficulty and right face droop tonight. Nursing staff stated that around 11 pm tonight she noticed that patient was having difficulty findings some words out and also had droopiness of the right face which was not not previously reported. Nurse performed neuro-exam and reported NHSS 3. He denies weakness, numbness, double vision, vertigo, difficulty swallowing, confusion, or visual disturbance. Currently on IV heparin. Date last known well: 06/12/13. Time last known well: unknown.  Past Medical History  Diagnosis Date  . IHD (ischemic heart disease)   . Hyperlipidemia   . Vitamin B12 deficiency   . HYPERTENSION 09/20/2008    Qualifier: Diagnosis of  By: Darrick Meigs    . CAD 09/20/2008    Qualifier: Diagnosis of  By: Darrick Meigs    . BRADYCARDIA 09/20/2008    Qualifier: Diagnosis of  By: Darrick Meigs    . 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 TIA 09/20/2008    Qualifier: Diagnosis of  By: Darrick Meigs    . PSORIASIS 09/20/2008    Qualifier: Diagnosis of  By: Darrick Meigs    . Benign hypertensive heart disease without heart failure 08/03/2010  . Hx of CABG 08/03/2010  .  Intractable hiccoughs 08/03/2010  . Vascular claudication 11/15/2010  . Dyslipidemia 09/05/2011  . GERD (gastroesophageal reflux disease) 11/06/2011  . Nephrolithiasis 11/06/2011  . Carotid stenosis 11/06/2011    Bilat 60-80% 2010  . Complication of anesthesia     had hallucinations after anesthesia  . Myocardial infarction   . Shortness of breath     ambulation  . CHF (congestive heart failure)   . DVT (deep venous thrombosis)   . Pneumonia     "once"  . Type II diabetes mellitus   . Pacemaker 10/08/2008    Qualifier: Diagnosis of  By: Lovena Le, MD, Martyn Malay   . History of stomach ulcers   . Cerebellar stroke, acute ~ 2009    residual:  "hiccoughs" (06/09/2013)  . Mini stroke     "  several/dr" (06/09/2013)  . Tobacco abuse   . PAD (peripheral artery disease)   . PVD (peripheral vascular disease)     Past Surgical History  Procedure Laterality Date  . Insert / replace / remove pacemaker    . Femoral-femoral bypass graft  02/25/2012    Procedure: BYPASS GRAFT FEMORAL-FEMORAL ARTERY;  Surgeon: Conrad Penalosa, MD;  Location: [facility] OR;  Service: Vascular;  Laterality: N/A;  RIGHT TO LEFT Using 72m x 30 cm Hemashield Graft  . Femoral-tibial bypass graft  02/25/2012    Procedure: BYPASS GRAFT FEMORAL-TIBIAL ARTERY;  Surgeon: BConrad Boone MD;  [facility]  Service: Vascular;  Laterality: Left;  . Endarterectomy femoral  02/25/2012    Procedure: ENDARTERECTOMY FEMORAL;  Surgeon: BConrad Middlebourne MD;  [facility]  Service: Vascular;  Laterality: Bilateral;  . Patch angioplasty  02/25/2012    Procedure: PATCH ANGIOPLASTY;  Surgeon: BConrad Lewiston MD;  [facility];  Service: Vascular;  Laterality: Bilateral;  using  1 cm x 6 cm vascu guard patch angioplasty  . Intraoperative arteriogram  02/25/2012    Procedure: INTRA OPERATIVE ARTERIOGRAM;  Surgeon: BConrad Kinta MD;  [facility]  Service: Vascular;  Laterality: Left;  . Coronary artery bypass graft      "CABG X5"  . Coronary  angioplasty  09/18/2006    WELL PRESERVED LEFT VENTRICULAR SYSTOLIC FUNCTION. THERE APPEARS TBE SOME HYPOKINESIS OF THE POSTERIOR WALL. EF 60%  . Coronary angioplasty with stent placement      "multiple"  . Kidney stone surgery      "cut them out"    Family History  Problem Relation Age of Onset  . COPD Mother   . Diabetes Mother   . Heart disease Mother   . Hyperlipidemia Mother   . Hypertension Mother   . Heart attack Mother   . Coronary artery disease Father   . Heart disease Father     before age 66 . Hyperlipidemia Father   . Hypertension Father   . Heart attack Father     Social History:  reports that he has been smoking Cigarettes.  He has a 28.5 pack-year smoking history. He has never used smokeless tobacco. He reports that he drinks alcohol. He reports that he does not use illicit drugs.  Allergies  Allergen Reactions  . Imdur [Isosorbide Mononitrate]     HEADACHES   . Metformin And Related     ABDOMINAL CRAMPS    MEDICATIONS:                                                                                                                     I have reviewed the patient's current medications.   ROS:  History obtained from nursing staff, chart review and the patient  General ROS: negative for - chills, fatigue, fever, night sweats, or weight loss Psychological ROS: negative for - behavioral disorder, hallucinations, memory difficulties, mood swings or suicidal ideation Ophthalmic ROS: negative for - blurry vision, double vision, eye pain or loss of vision ENT ROS: negative for - epistaxis, nasal discharge, oral lesions, sore throat, tinnitus or vertigo Allergy and Immunology ROS: negative for - hives or itchy/watery eyes Hematological and Lymphatic ROS: negative for - bleeding problems, bruising or swollen lymph nodes Endocrine ROS:  negative for - galactorrhea, hair pattern changes, polydipsia/polyuria or temperature intolerance Respiratory ROS: negative for - cough, hemoptysis, or wheezing Cardiovascular ROS: significant for - chest pain, dyspnea on exertion Gastrointestinal ROS: negative for - abdominal pain, diarrhea, hematemesis, nausea/vomiting or stool incontinence Genito-Urinary ROS: negative for - dysuria, hematuria, incontinence or urinary frequency/urgency Musculoskeletal ROS: negative for - joint swelling or muscular weakness Neurological ROS: as noted in HPI Dermatological ROS: negative for rash and skin lesion changes  Physical exam: pleasant male in no apparent distress. Blood pressure 168/87, pulse 72, temperature 98.2 F (36.8 C), temperature source Oral, resp. rate 15, height 5' 6"  (1.676 m), weight 78.2 kg (172 lb 6.4 oz), SpO2 100.00%. Head: normocephalic. Neck: supple, no bruits, no JVD. Cardiac: no murmurs. Lungs: clear. Abdomen: soft, no tender, no mass. Extremities: No clubbing, cyanosis or edema. CV: pulses palpable throughout  Neurologic Examination:                                                                                                      Mental Status: Alert, awake,oriented x 4, thought content appropriate.  Mild dysarthria without evidence of aphasia.  Able to follow 3 step commands without difficulty. Cranial Nerves: II: Discs flat bilaterally; Visual fields grossly normal, pupils equal, round, reactive to light and accommodation III,IV, VI: ptosis not present, extra-ocular motions intact bilaterally V,VII: smile asymmetric due to slight right face droop, facial light touch sensation normal bilaterally VIII: hearing normal bilaterally IX,X: gag reflex present XI: bilateral shoulder shrug XII: midline tongue extension without atrophy or fasciculations  Motor: Right : Upper extremity   5/5    Left:     Upper extremity   5/5  Lower extremity   5/5     Lower extremity    5/5 Tone and bulk:normal tone throughout; no atrophy noted Sensory: Pinprick and light touch intact throughout, bilaterally Deep Tendon Reflexes:  1 all over Plantars: Right: downgoing   Left: downgoing Cerebellar: normal finger-to-nose,  normal heel-to-shin test Gait:  No tested.    Lab Results  Component Value Date/Time   CHOL 122 06/10/2013  3:33 AM    Results for orders placed during the hospital encounter of 06/09/13 (from the past 48 hour(s))  CBC     Status: Abnormal   Collection Time    06/10/13  3:33 AM      Result Value Ref Range   WBC 6.4  4.0 - 10.5 K/uL   RBC 4.28  4.22 - 5.81 MIL/uL   Hemoglobin 11.8 (*)  13.0 - 17.0 g/dL   HCT 36.0 (*) 39.0 - 52.0 %   MCV 84.1  78.0 - 100.0 fL   MCH 27.6  26.0 - 34.0 pg   MCHC 32.8  30.0 - 36.0 g/dL   RDW 16.1 (*) 11.5 - 15.5 %   Platelets 261  150 - 400 K/uL  BASIC METABOLIC PANEL     Status: Abnormal   Collection Time    06/10/13  3:33 AM      Result Value Ref Range   Sodium 139  137 - 147 mEq/L   Potassium 3.8  3.7 - 5.3 mEq/L   Chloride 101  96 - 112 mEq/L   CO2 24  19 - 32 mEq/L   Glucose, Bld 136 (*) 70 - 99 mg/dL   BUN 14  6 - 23 mg/dL   Creatinine, Ser 1.58 (*) 0.50 - 1.35 mg/dL   Calcium 8.7  8.4 - 10.5 mg/dL   GFR calc non Af Amer 44 (*) >90 mL/min   GFR calc Af Amer 51 (*) >90 mL/min   Comment: (NOTE)     The eGFR has been calculated using the CKD EPI equation.     This calculation has not been validated in all clinical situations.     eGFR's persistently <90 mL/min signify possible Chronic Kidney     Disease.  LIPID PANEL     Status: Abnormal   Collection Time    06/10/13  3:33 AM      Result Value Ref Range   Cholesterol 122  0 - 200 mg/dL   Triglycerides 89  <150 mg/dL   HDL 28 (*) >39 mg/dL   Total CHOL/HDL Ratio 4.4     VLDL 18  0 - 40 mg/dL   LDL Cholesterol 76  0 - 99 mg/dL   Comment:            Total Cholesterol/HDL:CHD Risk     Coronary Heart Disease Risk Table                          Men   Women      1/2 Average Risk   3.4   3.3      Average Risk       5.0   4.4      2 X Average Risk   9.6   7.1      3 X Average Risk  23.4   11.0                Use the calculated Patient Ratio     above and the CHD Risk Table     to determine the patient's CHD Risk.                ATP III CLASSIFICATION (LDL):      <100     mg/dL   Optimal      100-129  mg/dL   Near or Above                        Optimal      130-159  mg/dL   Borderline      160-189  mg/dL   High      >190     mg/dL   Very High  HEPARIN LEVEL (UNFRACTIONATED)     Status: None   Collection Time    06/10/13  3:33 AM      Result  Value Ref Range   Heparin Unfractionated 0.36  0.30 - 0.70 IU/mL   Comment:            IF HEPARIN RESULTS ARE BELOW     EXPECTED VALUES, AND PATIENT     DOSAGE HAS BEEN CONFIRMED,     SUGGEST FOLLOW UP TESTING     OF ANTITHROMBIN III LEVELS.  TROPONIN I     Status: None   Collection Time    06/10/13  1:20 PM      Result Value Ref Range   Troponin I <0.30  <0.30 ng/mL   Comment:            Due to the release kinetics of cTnI,     a negative result within the first hours     of the onset of symptoms does not rule out     myocardial infarction with certainty.     If myocardial infarction is still suspected,     repeat the test at appropriate intervals.  TROPONIN I     Status: None   Collection Time    06/10/13  8:05 PM      Result Value Ref Range   Troponin I <0.30  <0.30 ng/mL   Comment:            Due to the release kinetics of cTnI,     a negative result within the first hours     of the onset of symptoms does not rule out     myocardial infarction with certainty.     If myocardial infarction is still suspected,     repeat the test at appropriate intervals.  CBC     Status: Abnormal   Collection Time    06/11/13  3:28 AM      Result Value Ref Range   WBC 5.4  4.0 - 10.5 K/uL   RBC 4.30  4.22 - 5.81 MIL/uL   Hemoglobin 11.7 (*) 13.0 - 17.0 g/dL   HCT 35.7 (*) 39.0 -  52.0 %   MCV 83.0  78.0 - 100.0 fL   MCH 27.2  26.0 - 34.0 pg   MCHC 32.8  30.0 - 36.0 g/dL   RDW 16.0 (*) 11.5 - 15.5 %   Platelets 225  150 - 400 K/uL  HEPARIN LEVEL (UNFRACTIONATED)     Status: Abnormal   Collection Time    06/11/13  3:28 AM      Result Value Ref Range   Heparin Unfractionated 0.29 (*) 0.30 - 0.70 IU/mL   Comment:            IF HEPARIN RESULTS ARE BELOW     EXPECTED VALUES, AND PATIENT     DOSAGE HAS BEEN CONFIRMED,     SUGGEST FOLLOW UP TESTING     OF ANTITHROMBIN III LEVELS.  BASIC METABOLIC PANEL     Status: Abnormal   Collection Time    06/11/13  3:28 AM      Result Value Ref Range   Sodium 143  137 - 147 mEq/L   Potassium 3.5 (*) 3.7 - 5.3 mEq/L   Chloride 104  96 - 112 mEq/L   CO2 25  19 - 32 mEq/L   Glucose, Bld 114 (*) 70 - 99 mg/dL   BUN 14  6 - 23 mg/dL   Creatinine, Ser 1.60 (*) 0.50 - 1.35 mg/dL   Calcium 8.9  8.4 - 10.5 mg/dL   GFR calc non Af Amer 44 (*) >90 mL/min  GFR calc Af Amer 51 (*) >90 mL/min   Comment: (NOTE)     The eGFR has been calculated using the CKD EPI equation.     This calculation has not been validated in all clinical situations.     eGFR's persistently <90 mL/min signify possible Chronic Kidney     Disease.  POCT ACTIVATED CLOTTING TIME     Status: None   Collection Time    06/11/13 11:45 AM      Result Value Ref Range   Activated Clotting Time 404    GLUCOSE, CAPILLARY     Status: Abnormal   Collection Time    06/11/13 11:16 PM      Result Value Ref Range   Glucose-Capillary 199 (*) 70 - 99 mg/dL    No results found.  Assessment/Plan: 68 y/o with multiple risk factors for stroke, s/p cath and PCI today, noted to have word finding difficulty (resolved) and right face droop earlier tonight. No a candidate for IV thrombolysis due to major procedure today, IV heparin, uncertain time of onset, and very mild deficits.  Neuro-exam significant for dysarthria (old) and subtle right face droop. He is on IV  heparin. Will get CT brain to ruled out hemorrhage. Unfortunately, won't be able to have MRI brain due to pacemaker. Will follow up.  Dorian Pod, MD 06/11/2013, 11:57 PM

## 2013-06-11 NOTE — H&P (View-Only) (Signed)
Patient Name: Jacob LenisRoger D Snyder Date of Encounter: 06/11/2013     Active Problems:   Unstable angina    SUBJECTIVE  No chest pain overnight.Rhythm NSR. Dyspnea unchanged.  CURRENT MEDS . albuterol  2.5 mg Nebulization Q6H  . amLODipine  5 mg Oral Daily  . aspirin EC  81 mg Oral Daily  . atorvastatin  80 mg Oral q1800  . carvedilol  6.25 mg Oral Q supper  . clopidogrel  75 mg Oral Q breakfast  . diazepam  5 mg Oral On Call  . ezetimibe  10 mg Oral Daily  . famotidine  10 mg Oral Daily  . losartan  50 mg Oral Daily  . nitroGLYCERIN  1 inch Topical 3 times per day  . pantoprazole  40 mg Oral Daily  . potassium chloride  10 mEq Intravenous Q1 Hr x 2  . sodium chloride  3 mL Intravenous Q12H    OBJECTIVE  Filed Vitals:   06/10/13 2300 06/11/13 0000 06/11/13 0302 06/11/13 0407  BP:  118/77  117/68  Pulse: 72 60  80  Temp:  98.2 F (36.8 C)  97.3 F (36.3 C)  TempSrc:  Oral  Oral  Resp: 22 20  17   Height:      Weight:      SpO2: 99% 98% 86% 99%    Intake/Output Summary (Last 24 hours) at 06/11/13 0743 Last data filed at 06/11/13 0600  Gross per 24 hour  Intake 1313.7 ml  Output    450 ml  Net  863.7 ml   Filed Weights   06/09/13 1510  Weight: 172 lb 6.4 oz (78.2 kg)    PHYSICAL EXAM  General: Pleasant, NAD. Neuro: Alert and oriented X 3. Moves all extremities spontaneously. Psych: Normal affect. HEENT:  Normal  Neck: Supple without bruits or JVD. Lungs:  Resp regular and unlabored, CTA. Heart: RRR no s3, s4, or murmurs. Abdomen: Soft, non-tender, non-distended, BS + x 4.  Extremities: No clubbing, cyanosis or edema. Pedal pulses equivocal.   Accessory Clinical Findings  CBC  Recent Labs  06/10/13 0333 06/11/13 0328  WBC 6.4 5.4  HGB 11.8* 11.7*  HCT 36.0* 35.7*  MCV 84.1 83.0  PLT 261 225   Basic Metabolic Panel  Recent Labs  06/10/13 0333 06/11/13 0328  NA 139 143  K 3.8 3.5*  CL 101 104  CO2 24 25  GLUCOSE 136* 114*  BUN 14 14    CREATININE 1.58* 1.60*  CALCIUM 8.7 8.9   Liver Function Tests  Recent Labs  06/09/13 1037  AST 12  ALT 7  ALKPHOS 113  BILITOT 0.9  PROT 7.8  ALBUMIN 3.4*   No results found for this basename: LIPASE, AMYLASE,  in the last 72 hours Cardiac Enzymes  Recent Labs  06/09/13 1250 06/10/13 1320 06/10/13 2005  TROPONINI 0.52* <0.30 <0.30   BNP No components found with this basename: POCBNP,  D-Dimer No results found for this basename: DDIMER,  in the last 72 hours Hemoglobin A1C No results found for this basename: HGBA1C,  in the last 72 hours Fasting Lipid Panel  Recent Labs  06/10/13 0333  CHOL 122  HDL 28*  LDLCALC 76  TRIG 89  CHOLHDL 4.4   Thyroid Function Tests  Recent Labs  06/09/13 1250  TSH 4.720*    TELE  NSR.  ECG   2D Echo: On 06/10/13: Study Conclusions  - Left ventricle: The cavity size was normal. Systolic function was severely reduced. The  estimated ejection fraction was in the range of 20% to 25%. Diffuse hypokinesis. There is severe Hypokinesis of the anteroseptal myocardium. There is akinesis of the entire anterolateral myocardium. - Aortic valve: no significant AV gradient was noted on doppler analysis but suspect that patient has some degree of AS with normal gradient due to low CO with severe LV dysfunction Severe diffuse thickening and calcification. - Mitral valve: Mild regurgitation. - Left atrium: The atrium was moderately dilated. - Tricuspid valve: Moderate regurgitation.  Radiology/Studies  Dg Chest Portable 1 View  06/09/2013   CLINICAL DATA:  Chest pain  EXAM: PORTABLE CHEST - 1 VIEW  COMPARISON:  Chest radiograph 10/21/2012  FINDINGS: There changes of median sternotomy for CABG. Left chest wall dual lead pacer appears stable in the frontal projection. Cardiomegaly is stable. There is pulmonary vascular congestion. There is a hazy opacity at the right lung base and the right costophrenic angle is blunted,  suggesting a small right pleural effusion. . There is obscuration of the medial right hemidiaphragm. There are no focal opacities on the left. No visible pleural effusion on the left. The trachea is midline.  IMPRESSION: 1. Cardiomegaly and pulmonary vascular congestion. 2. Small right pleural effusion and right basilar atelectasis.   Electronically Signed   By: Britta Mccreedy M.D.   On: 06/09/2013 10:48    ASSESSMENT AND PLAN 1. Non-STEMI with only first troponin positive. 2. status post CABG  3. Hypertension  4. chronic kidney disease  5. Hyperlipidemia  6. dyspnea secondary to acute on chronic systolic heart failure. Last echocardiogram 11/07/12 showed ejection fraction 30-35%. New echo shows worsening EF 20-25%  7. ongoing tobacco abuse  Plan: left heart cath today. Replete K+   Signed, [name] MD

## 2013-06-11 NOTE — CV Procedure (Addendum)
CARDIAC CATHETERIZATION AND PERCUTANEOUS CORONARY INTERVENTION REPORT  NAME:  [name]   MRN: [id number] DOB:  [birth year]   ADMIT DATE: 06/09/2013 Procedure Date: 06/11/2013  INTERVENTIONAL CARDIOLOGIST: Leonie Man, M.D., MS PRIMARY CARE PROVIDER: Cathlean Cower, MD PRIMARY CARDIOLOGIST: Darlin Coco, MD  PATIENT:  [name] is a 68 y.o. male with a history of coronary disease status post CABG in 2010, history of CVA, portal hypertension, hyperlipidemia, current smoking, status post pacemaker placement who also has severe PAD status post a lateral femoral artery endarterectomies and fem-fem bypass. He Presented to [facility] Emergency room on April 7 with 3 weeks of progressively worsening chest tightness and dyspnea consistent with crescendo unstable angina. He was unable to walk more than 20 feet without dyspnea. Initial troponins were mildly elevated, subsequently below the threshold. Echocardiogram demonstrated severely reduced ejection fraction of 20-25% with global hypokinesis. Based on the presentation of acute coronary syndrome with crescendo unstable angina and newly reduced ejection fraction, the decision was made to proceed with invasive evaluation with cardiac catheterization plus minus PCI.  PRE-OPERATIVE DIAGNOSIS:    UNSTABLE ANGINA / NSTEMI  ISCHEMIC CARDIOMYOPATHY WITH WORSENING EF  KNOWN CAD - CABG  SEVERE PAD  PROCEDURES PERFORMED:    LEFT HEART CATHETERIZATION WITH NATIVE & GRAFT ANGIOGRAPHY  PERCUTANEOUS CORONARY INTERVENTION OF THE SVG-DIAGONAL WITH AN INTEGRITY RESOLUTE DES 3.5 MM X 15 MM -- 3.75 MM  PROCEDURE:Consent:  Risks of procedure as well as the alternatives and risks of each were explained to the (patient/caregiver).  Consent for procedure obtained. Consent for signed by MD and patient with RN witness -- placed on chart.  PROCEDURE: The patient was brought to the [facility] in the fasting state and  prepped and draped in the usual sterile fashion for Left radial OR Right groin access. A modified Allen's test with plethysmography was performed, revealing excellent Ulnar artery collateral flow.  Sterile technique was used including antiseptics, cap, gloves, gown, hand hygiene, mask and sheet.  Skin prep: Chlorhexidine.  Time Out: Verified patient identification, verified procedure, site/side was marked, verified correct patient position, special equipment/implants available, medications/allergies/relevent history reviewed, required imaging and test results available.  Performed  Access: LEFT RADIAL Artery; 6 Fr Sheath -- Seldinger technique (Angiocath Micropuncture Kit)  IA Radial Cocktail - 10 mL, IV Heparin 4000 Units Diagnostic Left Heart Catheterization with Coronary and Graft Angiography:  5 Fr JR 4, JL4, AL-1, AR-2 catheters advanced and exchanged over long exchange Safety J-wire into the ascending aorta and used for selective coronary artery engagement.  Right Coronary Artery Angiography: JR4  Left Coronary Artery Angiography: JL4  SVG-rPDA Angiography: JR4, AL1, AR2  SVG-OM Angiography: AL-1  SVG-Diag Angiography: AR-2  LIMA-LAD Angiography: JR4  LV Hemodynamics (LV Gram): JR4  TR Band:  1212 Hours, 12 mL air  Hemodynamics:  Central Aortic / Mean Pressures: 144/86 mmHg; 106 mmHg  Left Ventricular Pressures / EDP: 144/17 mmHg; 35 mmHg  Left Ventriculography: Not Done  EF: 20-25 % by Echocardiogram  Coronary Anatomy:  Left Main: Large-caliber vessel that bifurcates into the LAD and Circumflex. Mild calcification but no significant lesions. LAD: Was likely a large vessel but is currently a moderate caliber vessel with diffuse proximal stenosis of roughly 40-50% prior to a large first septal perforator trunk. This occurs at the site of a 100% occluded mid LAD stent. The perforator trunk is a major branch that has a least smaller moderate caliber. The ostium has a 90%  stenosis.  LIMA-LAD: Large-caliber widely patent graft to a moderate caliber distal LAD with diffuse mild to moderate luminal irregularities as it wraps the apex.  D1: Fills via a patent SVG  SVG-D1: Large-caliber vessel which feeds a small native diagonal branch just after the occluded LAD stent. There is a focal mid graft 99% subtotal occlusion followed by normal graft distally.  Left Circumflex: Begin a moderate caliber vessel with several tortuous segments proximal and mid with several tandem 60-70% stenoses. There are short stop segments of the previously grafted OM's visualized. The AV groove circumflex bifurcates distally into a small posterior lateral branch in the last terminal OM. The terminal OM has a tubular 70-80% stenosis in a less than 2 mm vessel.  Both significant OM branches that were grafted or occluded  SVG-OM-OM: 100% flush occluded at the aorto-ostium   RCA: Proximally occluded after a SA node branch  SVG-RPDA: Large-caliber graft with diffuse mild irregularities proximally. In the mid to distal segment there is a irregular hazy appearing 80-90% stenosis that has 2 sections all by a more distal 70% stenosis that is focal.  The graft fills a PDA which is moderate caliber with diffuse mild/moderate luminal irregularities. Retrograde filling fills a very short segment of the distal RCA before were seen to the right posterior AV groove branch which made to the RPL 1. RPL 1 has a tubular 60% stenosis in the proximal segment;  After reviewing the initial angiography, the culprit lesion was thought to be the 99% lesion in the SVG-diagonal however there also was significant lesions noted in the-RCA well the native and graft circumflex.  Based on the patient's significant elevated LVEDP and low EF the decision was made to perform PCI of the most significant lesion in the vein graft to diagonal with and staged PCI of the remainder of the RCA. His medical therapy for the native  circumflex based on the extent of diffuse moderate to severe disease. Preparation were made to proceed with PCI on the SVG-diagonal lesion.  Percutaneous Coronary Intervention:  Guide: 6 Fr   AR-2 Guidewire: Pro-water Based on the severe stenosis, the decision was made to use a small pre-dilation balloon to provide a channel large enough to allow passage of the Spider distal protection device  Predilation Balloon: Sprinter Legend 2.0 mm x 12 mm;   6 Atm x 30 Sec,  After initial balloon inflation, the balloon was removed in the spider wire was deployed in the distal vein graft. Stent: Integrity Resolute DES 3.5 mm x 15 mm;   Deployed at 12 Atm x 0.5 Sec,   Post dilation with stent balloon: 16 Atm x 45 Sec Final Diameter: 3.75 mm  Post deployment angiography in multiple views, with and without guidewire in place revealed excellent stent deployment and lesion coverage.  There was no evidence of dissection or perforation. With improved flow down the diagonal there actually appeared to be some retrograde flow into the native LAD.  MEDICATIONS:  Anesthesia:  Local Lidocaine 2 ml  Sedation: None ;   Premedication: 5 mg Valium  Isovue Contrast: 205 ml  Anticoagulation:  IV Heparin 4000 Units ; Angiomax Bolus & drip  Anti-Platelet Agent:  The patient is on a standing dose of Plavix and aspirin  PATIENT DISPOSITION:    The patient was transferred to the PACU holding area in a hemodynamicaly stable, chest pain free condition.  The patient tolerated the procedure well, and there were no complications.  EBL:   < 20 ml  The patient was stable before, during, and after the procedure.  POST-OPERATIVE DIAGNOSIS:    Severe native CAD as described. D. on grafted native circumflex has diffuse moderate to severe stenosis throughout, most notably in the distal OM branch.  The most likely culprit lesion is the severe lesion in the SVG diagonal -- treated with successful DES stent PCI as  described  Severe tandem lesions in the mid-distal SVG-RPDA  Severely elevated LVEDP, with systemic hypertension  PLAN OF CARE:  Transfer back to the TCU for standard post radial cath care.   Continue Angiomax infusion until the current bag is complete (roughly 1 hour)   Restart Heparin drip 6 hours post TR band removal  Initiate IV diuretics for severely elevated EDP suggestive of acute on chronic systolic and diastolic heart failure  After discussing with Dr. brachial, we decided to consider a staged PCI early next week on the SVG-RCA. This is to allow time for recovery of renal function and diuresis.   Leonie Man, M.D., M.S. [facility] [address] [place], [place]  [address]  [phone]  06/11/2013 12:11 PM

## 2013-06-11 NOTE — Progress Notes (Signed)
Patient Name: [name] Date of Encounter: 06/11/2013     Active Problems:   Unstable angina    SUBJECTIVE  No chest pain overnight.Rhythm NSR. Dyspnea unchanged.  CURRENT MEDS . albuterol  2.5 mg Nebulization Q6H  . amLODipine  5 mg Oral Daily  . aspirin EC  81 mg Oral Daily  . atorvastatin  80 mg Oral q1800  . carvedilol  6.25 mg Oral Q supper  . clopidogrel  75 mg Oral Q breakfast  . diazepam  5 mg Oral On Call  . ezetimibe  10 mg Oral Daily  . famotidine  10 mg Oral Daily  . losartan  50 mg Oral Daily  . nitroGLYCERIN  1 inch Topical 3 times per day  . pantoprazole  40 mg Oral Daily  . potassium chloride  10 mEq Intravenous Q1 Hr x 2  . sodium chloride  3 mL Intravenous Q12H    OBJECTIVE  Filed Vitals:   06/10/13 2300 06/11/13 0000 06/11/13 0302 06/11/13 0407  BP:  118/77  117/68  Pulse: 72 60  80  Temp:  98.2 F (36.8 C)  97.3 F (36.3 C)  TempSrc:  Oral  Oral  Resp: 22 20  17   Height:      Weight:      SpO2: 99% 98% 86% 99%    Intake/Output Summary (Last 24 hours) at 06/11/13 0743 Last data filed at 06/11/13 0600  Gross per 24 hour  Intake 1313.7 ml  Output    450 ml  Net  863.7 ml   Filed Weights   06/09/13 1510  Weight: 172 lb 6.4 oz (78.2 kg)    PHYSICAL EXAM  General: Pleasant, NAD. Neuro: Alert and oriented X 3. Moves all extremities spontaneously. Psych: Normal affect. HEENT:  Normal  Neck: Supple without bruits or JVD. Lungs:  Resp regular and unlabored, CTA. Heart: RRR no s3, s4, or murmurs. Abdomen: Soft, non-tender, non-distended, BS + x 4.  Extremities: No clubbing, cyanosis or edema. Pedal pulses equivocal.   Accessory Clinical Findings  CBC  Recent Labs  06/10/13 0333 06/11/13 0328  WBC 6.4 5.4  HGB 11.8* 11.7*  HCT 36.0* 35.7*  MCV 84.1 83.0  PLT 261 225   Basic Metabolic Panel  Recent Labs  06/10/13 0333 06/11/13 0328  NA 139 143  K 3.8 3.5*  CL 101 104  CO2 24 25  GLUCOSE 136* 114*  BUN 14 14    CREATININE 1.58* 1.60*  CALCIUM 8.7 8.9   Liver Function Tests  Recent Labs  06/09/13 1037  AST 12  ALT 7  ALKPHOS 113  BILITOT 0.9  PROT 7.8  ALBUMIN 3.4*   No results found for this basename: LIPASE, AMYLASE,  in the last 72 hours Cardiac Enzymes  Recent Labs  06/09/13 1250 06/10/13 1320 06/10/13 2005  TROPONINI 0.52* <0.30 <0.30   BNP No components found with this basename: POCBNP,  D-Dimer No results found for this basename: DDIMER,  in the last 72 hours Hemoglobin A1C No results found for this basename: HGBA1C,  in the last 72 hours Fasting Lipid Panel  Recent Labs  06/10/13 0333  CHOL 122  HDL 28*  LDLCALC 76  TRIG 89  CHOLHDL 4.4   Thyroid Function Tests  Recent Labs  06/09/13 1250  TSH 4.720*    TELE  NSR.  ECG   2D Echo: On 06/10/13: Study Conclusions  - Left ventricle: The cavity size was normal. Systolic function was severely reduced. The  estimated ejection fraction was in the range of 20% to 25%. Diffuse hypokinesis. There is severe Hypokinesis of the anteroseptal myocardium. There is akinesis of the entire anterolateral myocardium. - Aortic valve: no significant AV gradient was noted on doppler analysis but suspect that patient has some degree of AS with normal gradient due to low CO with severe LV dysfunction Severe diffuse thickening and calcification. - Mitral valve: Mild regurgitation. - Left atrium: The atrium was moderately dilated. - Tricuspid valve: Moderate regurgitation.  Radiology/Studies  Dg Chest Portable 1 View  06/09/2013   CLINICAL DATA:  Chest pain  EXAM: PORTABLE CHEST - 1 VIEW  COMPARISON:  Chest radiograph 10/21/2012  FINDINGS: There changes of median sternotomy for CABG. Left chest wall dual lead pacer appears stable in the frontal projection. Cardiomegaly is stable. There is pulmonary vascular congestion. There is a hazy opacity at the right lung base and the right costophrenic angle is blunted,  suggesting a small right pleural effusion. . There is obscuration of the medial right hemidiaphragm. There are no focal opacities on the left. No visible pleural effusion on the left. The trachea is midline.  IMPRESSION: 1. Cardiomegaly and pulmonary vascular congestion. 2. Small right pleural effusion and right basilar atelectasis.   Electronically Signed   By: Britta Mccreedy M.D.   On: 06/09/2013 10:48    ASSESSMENT AND PLAN 1. Non-STEMI with only first troponin positive. 2. status post CABG  3. Hypertension  4. chronic kidney disease  5. Hyperlipidemia  6. dyspnea secondary to acute on chronic systolic heart failure. Last echocardiogram 11/07/12 showed ejection fraction 30-35%. New echo shows worsening EF 20-25%  7. ongoing tobacco abuse  Plan: left heart cath today. Replete K+   Signed, [name] MD

## 2013-06-11 NOTE — Progress Notes (Signed)
ANTICOAGULATION CONSULT NOTE - Follow Up Consult  Pharmacy Consult for heparin Indication: chest pain/ACS  Allergies  Allergen Reactions  . Imdur [Isosorbide Mononitrate]     HEADACHES   . Metformin And Related     ABDOMINAL CRAMPS    Patient Measurements: Height: 5\' 6"  (167.6 cm) Weight: 172 lb 6.4 oz (78.2 kg) IBW/kg (Calculated) : 63.8  Vital Signs: Temp: 98.2 F (36.8 C) (04/09 0815) Temp src: Oral (04/09 0815) BP: 174/110 mmHg (04/09 0815) Pulse Rate: 70 (04/09 1051)  Labs:  Recent Labs  06/09/13 1015 06/09/13 1037 06/09/13 1250 06/09/13 2016 06/10/13 0333 06/10/13 1320 06/10/13 2005 06/11/13 0328  HGB 12.8*  --   --   --  11.8*  --   --  11.7*  HCT 39.2  --   --   --  36.0*  --   --  35.7*  PLT 314  --   --   --  261  --   --  225  APTT  --  30  --   --   --   --   --   --   LABPROT  --  14.6  --   --   --   --   --   --   INR  --  1.16  --   --   --   --   --   --   HEPARINUNFRC  --   --   --  0.21* 0.36  --   --  0.29*  CREATININE  --  1.73*  --   --  1.58*  --   --  1.60*  TROPONINI  --   --  0.52*  --   --  <0.30 <0.30  --     Estimated Creatinine Clearance: 45.3 ml/min (by C-G formula based on Cr of 1.6).   Medications:  Infusions:  . sodium chloride 1,000 mL (06/09/13 1900)  . sodium chloride 1 mL/kg/hr (06/11/13 1245)  . bivalirudin (ANGIOMAX) infusion 0.5 mg/mL (Non-ACS indications) Stopped (06/11/13 1245)  . heparin      Assessment: 68 y/o male [facility]. [facility].   Now s/p cath - heparin to resume 6 hours post sheath removal Planning PCI early next week  Goal of Therapy:  Heparin level 0.3-0.7 units/ml Monitor platelets by anticoagulation protocol: Yes   Plan:  - Heparin drip at 1250 units / hr starting at 8 pm - Follow up AM labs  Thank you. Okey RegalLisa Deneice Wack, PharmD [phone]  06/11/2013 1:17 PM

## 2013-06-12 ENCOUNTER — Inpatient Hospital Stay (HOSPITAL_COMMUNITY): Payer: Medicare Other

## 2013-06-12 DIAGNOSIS — I6529 Occlusion and stenosis of unspecified carotid artery: Secondary | ICD-10-CM

## 2013-06-12 LAB — CBC
HEMATOCRIT: 36.8 % — AB (ref 39.0–52.0)
HEMOGLOBIN: 12.2 g/dL — AB (ref 13.0–17.0)
MCH: 27.7 pg (ref 26.0–34.0)
MCHC: 33.2 g/dL (ref 30.0–36.0)
MCV: 83.6 fL (ref 78.0–100.0)
Platelets: 253 10*3/uL (ref 150–400)
RBC: 4.4 MIL/uL (ref 4.22–5.81)
RDW: 16.1 % — ABNORMAL HIGH (ref 11.5–15.5)
WBC: 6.4 10*3/uL (ref 4.0–10.5)

## 2013-06-12 LAB — BASIC METABOLIC PANEL
BUN: 11 mg/dL (ref 6–23)
CO2: 26 meq/L (ref 19–32)
CREATININE: 1.72 mg/dL — AB (ref 0.50–1.35)
Calcium: 9.3 mg/dL (ref 8.4–10.5)
Chloride: 99 mEq/L (ref 96–112)
GFR calc Af Amer: 46 mL/min — ABNORMAL LOW (ref >90)
GFR calc non Af Amer: 40 mL/min — ABNORMAL LOW (ref >90)
GLUCOSE: 127 mg/dL — AB (ref 70–99)
Potassium: 3.7 mEq/L (ref 3.7–5.3)
Sodium: 142 mEq/L (ref 137–147)

## 2013-06-12 LAB — HEMOGLOBIN A1C
Hgb A1c MFr Bld: 7.7 % — ABNORMAL HIGH (ref <5.7)
MEAN PLASMA GLUCOSE: 174 mg/dL — AB (ref <117)

## 2013-06-12 LAB — GLUCOSE, CAPILLARY: Glucose-Capillary: 173 mg/dL — ABNORMAL HIGH (ref 70–99)

## 2013-06-12 LAB — HEPARIN LEVEL (UNFRACTIONATED): Heparin Unfractionated: 0.27 IU/mL — ABNORMAL LOW (ref 0.30–0.70)

## 2013-06-12 MED ORDER — FUROSEMIDE 40 MG PO TABS
40.0000 mg | ORAL_TABLET | Freq: Every day | ORAL | Status: DC
  Administered 2013-06-13: 40 mg via ORAL
  Filled 2013-06-12: qty 1

## 2013-06-12 MED FILL — Sodium Chloride IV Soln 0.9%: INTRAVENOUS | Qty: 50 | Status: AC

## 2013-06-12 NOTE — Progress Notes (Signed)
ANTICOAGULATION CONSULT NOTE - Follow Up Consult  Pharmacy Consult for heparin Indication: chest pain/ACS  Allergies  Allergen Reactions  . Imdur [Isosorbide Mononitrate]     HEADACHES   . Metformin And Related     ABDOMINAL CRAMPS    Patient Measurements: Height: 5\' 6"  (167.6 cm) Weight: 172 lb 6.4 oz (78.2 kg) IBW/kg (Calculated) : 63.8  Vital Signs: Temp: 98.4 F (36.9 C) (04/10 0400) Temp src: Oral (04/10 0400) BP: 138/95 mmHg (04/10 0315) Pulse Rate: 72 (04/09 1955)  Labs:  Recent Labs  06/09/13 1037 06/09/13 1250  06/10/13 0333 06/10/13 1320 06/10/13 2005 06/11/13 0328 06/12/13 0347  HGB  --   --   --  11.8*  --   --  11.7* 12.2*  HCT  --   --   --  36.0*  --   --  35.7* 36.8*  PLT  --   --   --  261  --   --  225 253  APTT 30  --   --   --   --   --   --   --   LABPROT 14.6  --   --   --   --   --   --   --   INR 1.16  --   --   --   --   --   --   --   HEPARINUNFRC  --   --   < > 0.36  --   --  0.29* 0.27*  CREATININE 1.73*  --   --  1.58*  --   --  1.60* 1.72*  TROPONINI  --  0.52*  --   --  <0.30 <0.30  --   --   < > = values in this interval not displayed.  Estimated Creatinine Clearance: 42.2 ml/min (by C-G formula based on Cr of 1.72).   Medications:  Infusions:  . sodium chloride 1,000 mL (06/09/13 1900)  . heparin 1,250 Units/hr (06/12/13 0200)    Assessment: Heparin level 0.27 no infusion related issues. H/h plt stable  Goal of Therapy:  Heparin level 0.3-0.7 units/ml Monitor platelets by anticoagulation protocol: Yes   Plan:  -increase to 1400 units/hr and f/u with am labs 4/11  Janice CoffinWilliam Jonathan Loreli Debruler

## 2013-06-12 NOTE — Progress Notes (Signed)
Patient Name: [name] Date of Encounter: 06/12/2013     Active Problems:   Unstable angina    SUBJECTIVE  Patient is having no further chest discomfort.  He states that his breathing is better also.  Last night there was a question of slurred speech and possible TIA and neurology has seen him.  He will get a carotid Doppler today.  Her pressure is stable.  Rhythm is normal sinus rhythm.  CURRENT MEDS . albuterol  2.5 mg Nebulization TID  . amLODipine  5 mg Oral Daily  . aspirin EC  81 mg Oral Daily  . atorvastatin  80 mg Oral q1800  . carvedilol  6.25 mg Oral Q supper  . clopidogrel  75 mg Oral Q breakfast  . ezetimibe  10 mg Oral Daily  . famotidine  10 mg Oral Daily  . losartan  50 mg Oral Daily  . nitroGLYCERIN  1 inch Topical 3 times per day  . pantoprazole  40 mg Oral Daily    OBJECTIVE  Filed Vitals:   06/12/13 0315 06/12/13 0400 06/12/13 0835 06/12/13 0841  BP: 138/95  128/68   Pulse:      Temp:  98.4 F (36.9 C) 97.8 F (36.6 C)   TempSrc:  Oral Oral   Resp:      Height:      Weight:      SpO2:  96% 97% 98%    Intake/Output Summary (Last 24 hours) at 06/12/13 1154 Last data filed at 06/12/13 0800  Gross per 24 hour  Intake  517.2 ml  Output   1900 ml  Net -1382.8 ml   Filed Weights   06/09/13 1510  Weight: 172 lb 6.4 oz (78.2 kg)    PHYSICAL EXAM  General: Pleasant, NAD.  Neuro: Alert and oriented X 3. Moves all extremities spontaneously.  Speech is slightly slurred.  Left-sided mouth appears to be drooping but when he smiles he seems to have a symmetrical smile Psych: Normal affect for him. HEENT: Normal  Neck: Supple without bruits or JVD.  Lungs: Resp regular and unlabored, CTA.  Heart: RRR no s3, s4, or murmurs.  Abdomen: Soft, non-tender, non-distended, BS + x 4.  Extremities: No clubbing, cyanosis or edema. Pedal pulses equivocal.  Weak left radial pulse.  Mild ecchymosis left forearm.  Accessory Clinical  Findings  CBC  Recent Labs  06/11/13 0328 06/12/13 0347  WBC 5.4 6.4  HGB 11.7* 12.2*  HCT 35.7* 36.8*  MCV 83.0 83.6  PLT 225 253   Basic Metabolic Panel  Recent Labs  06/11/13 0328 06/12/13 0347  NA 143 142  K 3.5* 3.7  CL 104 99  CO2 25 26  GLUCOSE 114* 127*  BUN 14 11  CREATININE 1.60* 1.72*  CALCIUM 8.9 9.3   Liver Function Tests No results found for this basename: AST, ALT, ALKPHOS, BILITOT, PROT, ALBUMIN,  in the last 72 hours No results found for this basename: LIPASE, AMYLASE,  in the last 72 hours Cardiac Enzymes  Recent Labs  06/09/13 1250 06/10/13 1320 06/10/13 2005  TROPONINI 0.52* <0.30 <0.30   BNP No components found with this basename: POCBNP,  D-Dimer No results found for this basename: DDIMER,  in the last 72 hours Hemoglobin A1C No results found for this basename: HGBA1C,  in the last 72 hours Fasting Lipid Panel  Recent Labs  06/10/13 0333  CHOL 122  HDL 28*  LDLCALC 76  TRIG 89  CHOLHDL 4.4   Thyroid  Function Tests  Recent Labs  06/09/13 1250  TSH 4.720*    TELE  Normal sinus rhythm  ECG    Radiology/Studies  Ct Head Wo Contrast  06/12/2013   CLINICAL DATA:  stroke  EXAM: CT HEAD WITHOUT CONTRAST  TECHNIQUE: Contiguous axial images were obtained from the base of the skull through the vertex without intravenous contrast.  COMPARISON:  Prior CT from 02/20/2012  FINDINGS: Mild generalized cerebral atrophy with chronic microvascular ischemic disease is present, similar to prior.  There is no acute intracranial hemorrhage or infarct. No mass lesion or midline shift. Gray-white matter differentiation is well maintained. Ventricles are normal in size without evidence of hydrocephalus. CSF containing spaces are within normal limits. No extra-axial fluid collection.  The calvarium is intact.  Orbital soft tissues are within normal limits.  Probable retention cyst noted within the partially visualized maxillary sinuses  bilaterally. Scattered opacity noted within the ethmoidal air cells bilaterally. No mastoid effusion.  Scalp soft tissues are unremarkable.  IMPRESSION: 1. No acute intracranial process. 2. Mild generalized cerebral atrophy with chronic microvascular ischemic disease.   Electronically Signed   By: Rise MuBenjamin  McClintock M.D.   On: 06/12/2013 01:53   Dg Chest Portable 1 View  06/09/2013   CLINICAL DATA:  Chest pain  EXAM: PORTABLE CHEST - 1 VIEW  COMPARISON:  Chest radiograph 10/21/2012  FINDINGS: There changes of median sternotomy for CABG. Left chest wall dual lead pacer appears stable in the frontal projection. Cardiomegaly is stable. There is pulmonary vascular congestion. There is a hazy opacity at the right lung base and the right costophrenic angle is blunted, suggesting a small right pleural effusion. . There is obscuration of the medial right hemidiaphragm. There are no focal opacities on the left. No visible pleural effusion on the left. The trachea is midline.  IMPRESSION: 1. Cardiomegaly and pulmonary vascular congestion. 2. Small right pleural effusion and right basilar atelectasis.   Electronically Signed   By: Britta MccreedySusan  Turner M.D.   On: 06/09/2013 10:48    ASSESSMENT AND PLAN 1. Non-STEMI with only first troponin positive.  2. status post CABG  3. Hypertension  4. chronic kidney disease  5. Hyperlipidemia  6. dyspnea secondary to acute on chronic systolic heart failure. Last echocardiogram 11/07/12 showed ejection fraction 30-35%. New echo shows worsening EF 20-25%  7. ongoing tobacco abuse 8. coronary artery disease: Severe native CAD as described. D. on grafted native circumflex has diffuse moderate to severe stenosis throughout, most notably in the distal OM branch. The most likely culprit lesion is the severe lesion in the SVG diagonal -- treated with successful DES stent PCI as described  Severe tandem lesions in the mid-distal SVG-RPDA  Severely elevated LVEDP, with systemic  hypertension   Plan: The patient had successful DES stent yesterday by Dr. Herbie BaltimoreHarding to a severe stenosis in the saphenous vein graft to the diagonal. The patient may have had a slight TIA last night.  Neurology following. At this point we will stop IV heparin.  We will continue dual antiplatelet therapy.  We will attempt to mobilize and ambulate him today.  If no further chest discomfort we will probably be able to discharge over the weekend and follow closely as an outpatient.  If he has further chest discomfort however we would need to consider completion of the staged procedure early next week as discussed and Dr. Elissa HeftyHarding's note.  Signed, Cassell Clementhomas Amandalynn Pitz MD

## 2013-06-12 NOTE — Progress Notes (Signed)
ANTICOAGULATION CONSULT NOTE - Follow Up Consult  Pharmacy Consult for heparin Indication: chest pain/ACS  Allergies  Allergen Reactions  . Imdur [Isosorbide Mononitrate]     HEADACHES   . Metformin And Related     ABDOMINAL CRAMPS    Patient Measurements: Height: 5\' 6"  (167.6 cm) Weight: 172 lb 6.4 oz (78.2 kg) IBW/kg (Calculated) : 63.8  Vital Signs: Temp: 97.8 F (36.6 C) (04/10 0835) Temp src: Oral (04/10 0835) BP: 128/68 mmHg (04/10 0835)  Labs:  Recent Labs  06/09/13 1037 06/09/13 1250  06/10/13 0333 06/10/13 1320 06/10/13 2005 06/11/13 0328 06/12/13 0347  HGB  --   --   --  11.8*  --   --  11.7* 12.2*  HCT  --   --   --  36.0*  --   --  35.7* 36.8*  PLT  --   --   --  261  --   --  225 253  APTT 30  --   --   --   --   --   --   --   LABPROT 14.6  --   --   --   --   --   --   --   INR 1.16  --   --   --   --   --   --   --   HEPARINUNFRC  --   --   < > 0.36  --   --  0.29* 0.27*  CREATININE 1.73*  --   --  1.58*  --   --  1.60* 1.72*  TROPONINI  --  0.52*  --   --  <0.30 <0.30  --   --   < > = values in this interval not displayed.  Estimated Creatinine Clearance: 42.2 ml/min (by C-G formula based on Cr of 1.72).   Medications:  Infusions:  . sodium chloride 1,000 mL (06/09/13 1900)  . heparin 14 Units (06/12/13 0800)    Assessment: 68 y/o male who [facility] with [facility]. [facility] for [facility] evaluation.   Now s/p cath  Planning PCI early next week Heparin level this AM = 0.27  Goal of Therapy:  Heparin level 0.3-0.7 units/ml Monitor platelets by anticoagulation protocol: Yes   Plan:  - Heparin drip to 1550 units / hr - Follow up AM labs  Thank you. Okey RegalLisa Glorianna Gott, PharmD [phone]  06/12/2013 8:50 AM

## 2013-06-12 NOTE — Progress Notes (Signed)
Stroke Team Progress Note  HISTORY [name] is an 68 y.o. male with a past medical history significant for HTN, hyperlipidemia, type 2 DM, coronary disease status post CABG in 2010, current smoking, status post pacemaker placement, severe PAD status post a lateral femoral artery endarterectomies and fem-fem bypass, chronic congestive heart failure, bilateral carotid disease, TIAs, ischemic stroke with residual dysarthria, who was admitted on 4/7 with U/A and underwent cath and PCI today, noted to have word finding difficulty and right face droop tonight.  Nursing staff stated that around 11 pm tonight she noticed that patient was having difficulty findings some words out and also had droopiness of the right face which was not not previously reported. Nurse performed neuro-exam and reported NHSS 3.  He denies weakness, numbness, double vision, vertigo, difficulty swallowing, confusion, or visual disturbance.  Currently on IV heparin.  Date last known well: 06/12/13.  Time last known well: unknown.   Patient was not administered TPA secondary to iv heparin being on board, transient and mild symptoms   SUBJECTIVE His wife is at the bedside.  Overall he feels his condition is completely resolved.    OBJECTIVE Most recent Vital Signs: Filed Vitals:   06/12/13 0400 06/12/13 0835 06/12/13 0841 06/12/13 1218  BP:  128/68  142/79  Pulse:      Temp: 98.4 F (36.9 C) 97.8 F (36.6 C)  97.7 F (36.5 C)  TempSrc: Oral Oral  Oral  Resp:      Height:      Weight:      SpO2: 96% 97% 98% 98%   CBG (last 3)   Recent Labs  06/11/13 2316  GLUCAP 199*    IV Fluid Intake:   . sodium chloride 1,000 mL (06/09/13 1900)    MEDICATIONS  . albuterol  2.5 mg Nebulization TID  . amLODipine  5 mg Oral Daily  . aspirin EC  81 mg Oral Daily  . atorvastatin  80 mg Oral q1800  . carvedilol  6.25 mg Oral Q supper  . clopidogrel  75 mg Oral Q breakfast  . ezetimibe  10 mg Oral Daily  . famotidine   10 mg Oral Daily  . [START ON 06/13/2013] furosemide  40 mg Oral Daily  . losartan  50 mg Oral Daily  . nitroGLYCERIN  1 inch Topical 3 times per day  . pantoprazole  40 mg Oral Daily   PRN:  hydrALAZINE, meclizine, nitroGLYCERIN  Diet:  Cardiac   Activity:  Bedrest  DVT Prophylaxis: iv heparin  CLINICALLY SIGNIFICANT STUDIES Basic Metabolic Panel:  Recent Labs Lab 06/11/13 0328 06/12/13 0347  NA 143 142  K 3.5* 3.7  CL 104 99  CO2 25 26  GLUCOSE 114* 127*  BUN 14 11  CREATININE 1.60* 1.72*  CALCIUM 8.9 9.3   Liver Function Tests:  Recent Labs Lab 06/09/13 1037  AST 12  ALT 7  ALKPHOS 113  BILITOT 0.9  PROT 7.8  ALBUMIN 3.4*   CBC:  Recent Labs Lab 06/11/13 0328 06/12/13 0347  WBC 5.4 6.4  HGB 11.7* 12.2*  HCT 35.7* 36.8*  MCV 83.0 83.6  PLT 225 253   Coagulation:  Recent Labs Lab 06/09/13 1037  LABPROT 14.6  INR 1.16   Cardiac Enzymes:  Recent Labs Lab 06/09/13 1250 06/10/13 1320 06/10/13 2005  TROPONINI 0.52* <0.30 <0.30   Urinalysis: No results found for this basename: COLORURINE, APPERANCEUR, LABSPEC, PHURINE, GLUCOSEU, HGBUR, BILIRUBINUR, KETONESUR, PROTEINUR, UROBILINOGEN, NITRITE, LEUKOCYTESUR,  in the last  168 hours Lipid Panel    Component Value Date/Time   CHOL 122 06/10/2013 0333   TRIG 89 06/10/2013 0333   HDL 28* 06/10/2013 0333   CHOLHDL 4.4 06/10/2013 0333   VLDL 18 06/10/2013 0333   LDLCALC 76 06/10/2013 0333   HgbA1C  Lab Results  Component Value Date   HGBA1C 7.1* 09/11/2012    Urine Drug Screen:     Component Value Date/Time   LABOPIA NONE DETECTED 08/16/2007 0648   COCAINSCRNUR NONE DETECTED 08/16/2007 0648   LABBENZ NONE DETECTED 08/16/2007 0648   AMPHETMU NONE DETECTED 08/16/2007 0648   THCU NONE DETECTED 08/16/2007 0648   LABBARB  Value: NONE DETECTED        DRUG SCREEN FOR MEDICAL PURPOSES ONLY.  IF CONFIRMATION IS NEEDED FOR ANY PURPOSE, NOTIFY LAB WITHIN 5 DAYS. 08/16/2007 0648    Alcohol Level: No results found for this  basename: ETH,  in the last 168 hours  Ct Head Wo Contrast  06/12/2013   CLINICAL DATA:  stroke  EXAM: CT HEAD WITHOUT CONTRAST  TECHNIQUE: Contiguous axial images were obtained from the base of the skull through the vertex without intravenous contrast.  COMPARISON:  Prior CT from 02/20/2012  FINDINGS: Mild generalized cerebral atrophy with chronic microvascular ischemic disease is present, similar to prior.  There is no acute intracranial hemorrhage or infarct. No mass lesion or midline shift. Gray-white matter differentiation is well maintained. Ventricles are normal in size without evidence of hydrocephalus. CSF containing spaces are within normal limits. No extra-axial fluid collection.  The calvarium is intact.  Orbital soft tissues are within normal limits.  Probable retention cyst noted within the partially visualized maxillary sinuses bilaterally. Scattered opacity noted within the ethmoidal air cells bilaterally. No mastoid effusion.  Scalp soft tissues are unremarkable.  IMPRESSION: 1. No acute intracranial process. 2. Mild generalized cerebral atrophy with chronic microvascular ischemic disease.   Electronically Signed   By: Rise MuBenjamin  McClintock M.D.   On: 06/12/2013 01:53    CT of the brain    MRI of the brain  Cannot be done due to pacemaker    2D Echocardiogram  Left ventricle: The cavity size was normal. Systolic function was severely reduced. The estimated ejection fraction was in the range of 20% to 25%. Diffuse hypokinesis. There is severe Hypokinesis of the anteroseptal myocardium. There is akinesis of the entire anterolateral myocardium.   Carotid Doppler  pending  CXR 06/09/2013 1. Cardiomegaly and pulmonary vascular congestion.  2. Small right pleural effusion and right basilar atelectasis.    EKG  Sinus rhythm with 1st degree A-V block Possible Left atrial enlargement Left ventricular hypertrophy with QRS widening and repolarization  Therapy Recommendations on hold  you to cardiac condition  Physical Exam  Pleasant elderly caucasian mlae not in distress.Awake alert. Afebrile. Head is nontraumatic. Neck is supple without bruit. Hearing is normal. Cardiac exam no murmur or gallop. Lungs are clear to auscultation. Distal pulses are well felt. Pacer left infraclavicular Neurological Exam ;  Awake  Alert oriented x 3. Normal speech and language.eye movements full without nystagmus.fundi were not visualized. Vision acuity and fields appear normal. Hearing is normal. Palatal movements are normal. Face mildly asymmetric with slight right lower face weakness. Tongue midline. Normal strength, tone, reflexes and coordination. Normal sensation. Gait deferred. ASSESSMENT [name] is a 68 y.o. male presenting with facial droop and  Dysarthria s/p cardiac intervention possibly small infarct versus TIA etiology procedure related.   On heparin prior  to admission. Now on heparin for secondary stroke prevention. Patient with resultant  No deficits. Stroke work up underway.    LDL 76 mg%      Hospital day # 3  TREATMENT/PLAN  Continue   heparin for secondary stroke prevention till PCI then switch to dual antiplatelet therapy per cardiology  Check carotid dopplers and HbA1c  Mobilize out of bed when ok per cardiology  D/W wife and Dr Alvira Philips, MD 06/12/2013 2:00 PM  I    To contact Stroke Continuity provider, please refer to [email or web address]. After hours, contact General Neurology

## 2013-06-13 DIAGNOSIS — I5023 Acute on chronic systolic (congestive) heart failure: Secondary | ICD-10-CM

## 2013-06-13 DIAGNOSIS — G459 Transient cerebral ischemic attack, unspecified: Secondary | ICD-10-CM

## 2013-06-13 DIAGNOSIS — I6529 Occlusion and stenosis of unspecified carotid artery: Secondary | ICD-10-CM

## 2013-06-13 LAB — BASIC METABOLIC PANEL
BUN: 9 mg/dL (ref 6–23)
CALCIUM: 9 mg/dL (ref 8.4–10.5)
CO2: 25 mEq/L (ref 19–32)
CREATININE: 1.52 mg/dL — AB (ref 0.50–1.35)
Chloride: 101 mEq/L (ref 96–112)
GFR calc Af Amer: 54 mL/min — ABNORMAL LOW (ref >90)
GFR calc non Af Amer: 46 mL/min — ABNORMAL LOW (ref >90)
GLUCOSE: 107 mg/dL — AB (ref 70–99)
Potassium: 3.3 mEq/L — ABNORMAL LOW (ref 3.7–5.3)
Sodium: 143 mEq/L (ref 137–147)

## 2013-06-13 LAB — GLUCOSE, CAPILLARY: Glucose-Capillary: 140 mg/dL — ABNORMAL HIGH (ref 70–99)

## 2013-06-13 MED ORDER — ALBUTEROL SULFATE (2.5 MG/3ML) 0.083% IN NEBU: 2.5000 mg | INHALATION_SOLUTION | Freq: Four times a day (QID) | RESPIRATORY_TRACT | Status: DC | PRN

## 2013-06-13 MED ORDER — ALBUTEROL SULFATE (2.5 MG/3ML) 0.083% IN NEBU
2.5000 mg | INHALATION_SOLUTION | Freq: Two times a day (BID) | RESPIRATORY_TRACT | Status: DC
  Administered 2013-06-13 – 2013-06-14 (×2): 2.5 mg via RESPIRATORY_TRACT
  Filled 2013-06-13 (×3): qty 3

## 2013-06-13 MED ORDER — FUROSEMIDE 10 MG/ML IJ SOLN
40.0000 mg | Freq: Two times a day (BID) | INTRAMUSCULAR | Status: DC
  Administered 2013-06-13 – 2013-06-14 (×3): 40 mg via INTRAVENOUS
  Filled 2013-06-13 (×2): qty 4

## 2013-06-13 MED ORDER — LOSARTAN POTASSIUM 50 MG PO TABS
100.0000 mg | ORAL_TABLET | Freq: Every day | ORAL | Status: DC
  Administered 2013-06-14: 100 mg via ORAL
  Filled 2013-06-13: qty 2

## 2013-06-13 MED ORDER — LOSARTAN POTASSIUM 50 MG PO TABS
50.0000 mg | ORAL_TABLET | Freq: Once | ORAL | Status: AC
  Administered 2013-06-13: 50 mg via ORAL
  Filled 2013-06-13: qty 1

## 2013-06-13 MED ORDER — SPIRONOLACTONE 12.5 MG HALF TABLET
12.5000 mg | ORAL_TABLET | Freq: Every day | ORAL | Status: DC
  Administered 2013-06-13 – 2013-06-14 (×2): 12.5 mg via ORAL
  Filled 2013-06-13 (×2): qty 1

## 2013-06-13 MED ORDER — POTASSIUM CHLORIDE CRYS ER 20 MEQ PO TBCR
40.0000 meq | EXTENDED_RELEASE_TABLET | Freq: Once | ORAL | Status: AC
  Administered 2013-06-13: 40 meq via ORAL
  Filled 2013-06-13: qty 2

## 2013-06-13 NOTE — Consult Note (Signed)
Vascular Surgery Consultation  Reason for Consult: Severe left ICA stenosis and left brain TIAs  HPI: [name] is a 68 y.o. male who presents for evaluation of left brain TIA with severe left ICA stenosis. Patient had 30 minute TIA consisting of aphasia and right-sided weakness of facial muscles. Unable to have MRI scan because of pacemaker. CT scan negative for acute event. This occurred following intervention for non-STEMI -drug-eluting stent on 06/09/2013. Symptoms resolved after 30 minutes. I should found to have severe left ICA stenosis.   Past Medical History  Diagnosis Date  . IHD (ischemic heart disease)   . Hyperlipidemia   . Vitamin B12 deficiency   . HYPERTENSION 09/20/2008    Qualifier: Diagnosis of  By: Flonnie OvermanMcLean, Monica    . CAD 09/20/2008    Qualifier: Diagnosis of  By: Flonnie OvermanMcLean, Monica    . BRADYCARDIA 09/20/2008    Qualifier: Diagnosis of  By: Flonnie OvermanMcLean, Monica    . 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 TIA 09/20/2008    Qualifier: Diagnosis of  By: Flonnie OvermanMcLean, Monica    . PSORIASIS 09/20/2008    Qualifier: Diagnosis of  By: Flonnie OvermanMcLean, Monica    . Benign hypertensive heart disease without heart failure 08/03/2010  . Hx of CABG 08/03/2010  . Intractable hiccoughs 08/03/2010  . Vascular claudication 11/15/2010  . Dyslipidemia 09/05/2011  . GERD (gastroesophageal reflux disease) 11/06/2011  . Nephrolithiasis 11/06/2011  . Carotid stenosis 11/06/2011    Bilat 60-80% 2010  . Complication of anesthesia     had hallucinations after anesthesia  . Myocardial infarction   . Shortness of breath     ambulation  . CHF (congestive heart failure)   . DVT (deep venous thrombosis)   . Pneumonia     "once"  . Type II diabetes mellitus   . Pacemaker 10/08/2008    Qualifier: Diagnosis of  By: Ladona Ridgelaylor, MD, Jerrell MylarFACC, Gregg William   . History of stomach ulcers   . Cerebellar stroke, acute ~ 2009    residual:  "hiccoughs" (06/09/2013)  . Mini stroke      "several/dr" (06/09/2013)  . Tobacco abuse   . PAD (peripheral artery disease)   . PVD (peripheral vascular disease)    Past Surgical History  Procedure Laterality Date  . Insert / replace / remove pacemaker    . Femoral-femoral bypass graft  02/25/2012    Procedure: BYPASS GRAFT FEMORAL-FEMORAL ARTERY;  Surgeon: Fransisco HertzBrian L Chen, MD;  Location: [facility] OR;  Service: Vascular;  Laterality: N/A;  RIGHT TO LEFT Using 8mm x 30 cm Hemashield Graft  . Femoral-tibial bypass graft  02/25/2012    Procedure: BYPASS GRAFT FEMORAL-TIBIAL ARTERY;  Surgeon: Fransisco HertzBrian L Chen, MD;  [facility];  Service: Vascular;  Laterality: Left;  . Endarterectomy femoral  02/25/2012    Procedure: ENDARTERECTOMY FEMORAL;  Surgeon: Fransisco HertzBrian L Chen, MD;  [facility];  Service: Vascular;  Laterality: Bilateral;  . Patch angioplasty  02/25/2012    Procedure: PATCH ANGIOPLASTY;  Surgeon: Fransisco HertzBrian L Chen, MD;  [facility];  Service: Vascular;  Laterality: Bilateral;  using  1 cm x 6 cm vascu guard patch angioplasty  . Intraoperative arteriogram  02/25/2012    Procedure: INTRA OPERATIVE ARTERIOGRAM;  Surgeon: Fransisco HertzBrian L Chen, MD;  [facility];  Service: Vascular;  Laterality: Left;  . Coronary artery bypass graft      "CABG X5"  . Coronary angioplasty  09/18/2006  WELL PRESERVED LEFT VENTRICULAR SYSTOLIC FUNCTION. THERE APPEARS TBE SOME HYPOKINESIS OF THE POSTERIOR WALL. EF 60%  . Coronary angioplasty with stent placement      "multiple"  . Kidney stone surgery      "cut them out"   History   Social History  . Marital Status: Single    Spouse Name: N/A    Number of Children: 3  . Years of Education: N/A   Occupational History  . retired    Social History Main Topics  . Smoking status: Current Every Day Smoker -- 0.50 packs/day for 57 years    Types: Cigarettes  . Smokeless tobacco: Never Used  . Alcohol Use: Yes     Comment: 06/08/2013 "quit in 1994; drank on the weekends before that"  . Drug Use: No  . Sexual  Activity: No   Other Topics Concern  . None   Social History Narrative  . None   Family History  Problem Relation Age of Onset  . COPD Mother   . Diabetes Mother   . Heart disease Mother   . Hyperlipidemia Mother   . Hypertension Mother   . Heart attack Mother   . Coronary artery disease Father   . Heart disease Father     before age 62  . Hyperlipidemia Father   . Hypertension Father   . Heart attack Father    Allergies  Allergen Reactions  . Imdur [Isosorbide Mononitrate]     HEADACHES   . Metformin And Related     ABDOMINAL CRAMPS   Prior to Admission medications   Medication Sig Start Date End Date Taking? Authorizing Provider  amLODipine (NORVASC) 5 MG tablet Take 5 mg by mouth daily.   Yes Historical Provider, MD  aspirin 81 MG tablet Take 81 mg by mouth daily.     Yes Historical Provider, MD  atorvastatin (LIPITOR) 80 MG tablet Take 80 mg by mouth daily.   Yes Historical Provider, MD  carvedilol (COREG) 6.25 MG tablet Take 6.25 mg by mouth daily.    Yes Historical Provider, MD  clopidogrel (PLAVIX) 75 MG tablet Take 75 mg by mouth daily with breakfast.   Yes Historical Provider, MD  dexlansoprazole (DEXILANT) 60 MG capsule Take 60 mg by mouth daily.   Yes Historical Provider, MD  ezetimibe (ZETIA) 10 MG tablet Take 10 mg by mouth daily.   Yes Historical Provider, MD  losartan (COZAAR) 50 MG tablet Take 50 mg by mouth daily.   Yes Historical Provider, MD  meclizine (ANTIVERT) 25 MG tablet Take 25 mg by mouth 3 (three) times daily as needed for dizziness. Patient has been taking 1 tablet every day   Yes Historical Provider, MD  nitroGLYCERIN (NITROSTAT) 0.4 MG SL tablet Place 0.4 mg under the tongue every 5 (five) minutes as needed. For chest pain   Yes Historical Provider, MD  ranitidine (ZANTAC) 150 MG tablet Take 150 mg by mouth 2 (two) times daily.   Yes Historical Provider, MD     Positive ROS: Denies active chest pain, dyspnea on exertion, PND, orthopnea. He  had chest discomfort on arrival to the hospital. Denies claudication. Patient has had previous bilateral iliac endarterectomy, right to left fem-fem bypass, lithium posterior tibial bypass, by Dr. Leonides Sake  All other systems have been reviewed and were otherwise negative with the exception of those mentioned in the HPI and as above.  Physical Exam: Filed Vitals:   06/13/13 1155  BP: 165/86  Pulse: 74  Temp: 97.8  F (36.6 C)  Resp:     General: Alert, no acute distress HEENT: Normal for age Cardiovascular: Regular rate and rhythm. Carotid pulses 2+, no bruits audible Respiratory: Clear to auscultation. No cyanosis, no use of accessory musculature GI: No organomegaly, abdomen is soft and non-tender Skin: No lesions in the area of chief complaint Neurologic: Sensation intact distally Psychiatric: Patient is competent for consent with normal mood and affect Musculoskeletal: No obvious deformities Extremities: Both lower extremities well-perfused with 3+ femoral pulses bilaterally    Imaging reviewed: Patient has greater than 80% left ICA stenosis by duplex scanning   Assessment/Plan:  Patient admitted with non-St[place]y and had DES placed on 06/09/2013. Patient suffered left brain event and has severe left ICA stenosis Patient currently on aspirin and Plavix  Agreed-upon patient does need left carotid endarterectomy but would wait at least 6-8 weeks to get cardiac clearance in light of recent non-STEMI  We'll make an appointment for patient to followup with Dr. Imogene Burn in 6 weeks   Josephina Gip, MD 06/13/2013 2:54 PM

## 2013-06-13 NOTE — Progress Notes (Signed)
VASCULAR LAB PRELIMINARY  PRELIMINARY  PRELIMINARY  PRELIMINARY  Carotid Dopplers completed.    Preliminary report:  1-39% right ICA stenosis.  >80% left ICA stenosis.  Vertebral artery flow is antegrade.  Kern AlbertaCandace R Airianna Kreischer, RVT 06/13/2013, 10:20 AM

## 2013-06-13 NOTE — Progress Notes (Signed)
CARDIAC REHAB PHASE I   PRE:  Rate/Rhythm: 76 paced   BP:  Supine:   Sitting: 173/90  Standing:    SaO2: 93% ra  MODE:  Ambulation: 350 ft   POST:  Rate/Rhythem: 85 paced   BP:  Supine:   Sitting: 149/57  Standing:    SaO2: 96% ra  1212-1240 Pt ambulated in hallway slow steady gait.  Pt asymptomatic, returned to bed call light in reach.  Pt education completed including MI/stent, exercise guidelines, nutrition, low sodium diet, importance of medication compliance, smoking cessation, proper use of NTG and when to call MD/911.  Pt verbalizes desire to quit smoking however is unsure if he will be successful.  Pt instructed to call [phone].  Pt oriented to outpatient cardiac rehab program.  At pt request referral will be sent to [facility] [facility].  Understanding verbalized  Northwest AirlinesJoann H Jahnay Lantier

## 2013-06-13 NOTE — Progress Notes (Addendum)
Patient Name: Jacob LenisRoger D Snyder Date of Encounter: 06/13/2013     Active Problems:   Unstable angina    SUBJECTIVE   68 y.o. male with a PMHx of CAD, CABG, systolic HF, CVA, CKD, , HTN, hyperlipidemia, pacer, COPD , who was [facility] on 06/[facility]/2013 for evaluation of [place].   Echo EF 20-25% (previously 30-35%)  Cath 4/9 Severe native CAD as described. D. on grafted native circumflex has diffuse moderate to severe stenosis throughout, most notably in the distal OM branch. The most likely culprit lesion is the severe lesion in the SVG diagonal -- treated with successful DES stent PCI as described  Severe tandem lesions in the mid-distal SVG-RPDA  Severely elevated LVEDP (35), with systemic hypertension LIMA patent SVG - OM totally occluded  Initial plan post cath was for diuresis and staged PCI of SVG-PDA but patient developed slurred speech and facial droop post procedure suggestive of TIA. Seen by Neuro.  Still with slurred speech. Occasionally confused. Denies dyspnea but neck veins up and SBP high.    CURRENT MEDS . albuterol  2.5 mg Nebulization BID  . amLODipine  5 mg Oral Daily  . aspirin EC  81 mg Oral Daily  . atorvastatin  80 mg Oral q1800  . carvedilol  6.25 mg Oral Q supper  . clopidogrel  75 mg Oral Q breakfast  . ezetimibe  10 mg Oral Daily  . famotidine  10 mg Oral Daily  . furosemide  40 mg Oral Daily  . losartan  50 mg Oral Daily  . nitroGLYCERIN  1 inch Topical 3 times per day  . pantoprazole  40 mg Oral Daily    OBJECTIVE  Filed Vitals:   06/12/13 2300 06/13/13 0405 06/13/13 0748 06/13/13 0948  BP: 151/74 136/71 151/90 161/81  Pulse: 69 71 83   Temp: 97.4 F (36.3 C) 97.4 F (36.3 C) 97.7 F (36.5 C)   TempSrc: Oral Oral Oral   Resp: 16 16    Height:      Weight:  74.8 kg (164 lb 14.5 oz)    SpO2: 96% 95% 93%     Intake/Output Summary (Last 24 hours) at 06/13/13 1140 Last data filed at 06/13/13 0900  Gross per 24 hour  Intake 228.83 ml   Output    700 ml  Net -471.17 ml   Filed Weights   06/09/13 1510 06/13/13 0405  Weight: 78.2 kg (172 lb 6.4 oz) 74.8 kg (164 lb 14.5 oz)    PHYSICAL EXAM  General: Pleasant, NAD.  Neuro: Alert and oriented X 3. Moves all extremities spontaneously.  Speech is slightly slurred.  Left-sided droop Psych: Normal affect for him. HEENT: Normal  Neck: Supple without bruits JVP 7 Lungs: Resp regular and unlabored, CTA.  Heart: RRR no s3, s4,+2/6 MR Abdomen: Soft, non-tender, non-distended, BS + x 4.  Extremities: No clubbing, cyanosis or edema. Pedal pulses equivocal.  Weak left radial pulse.  Mild ecchymosis left forearm.  Accessory Clinical Findings  CBC  Recent Labs  06/11/13 0328 06/12/13 0347  WBC 5.4 6.4  HGB 11.7* 12.2*  HCT 35.7* 36.8*  MCV 83.0 83.6  PLT 225 253   Basic Metabolic Panel  Recent Labs  06/12/13 0347 06/13/13 0400  NA 142 143  K 3.7 3.3*  CL 99 101  CO2 26 25  GLUCOSE 127* 107*  BUN 11 9  CREATININE 1.72* 1.52*  CALCIUM 9.3 9.0   Liver Function Tests No results found for this basename:  AST, ALT, ALKPHOS, BILITOT, PROT, ALBUMIN,  in the last 72 hours No results found for this basename: LIPASE, AMYLASE,  in the last 72 hours Cardiac Enzymes  Recent Labs  06/10/13 1320 06/10/13 2005  TROPONINI <0.30 <0.30   BNP No components found with this basename: POCBNP,  D-Dimer No results found for this basename: DDIMER,  in the last 72 hours Hemoglobin A1C  Recent Labs  06/12/13 1610  HGBA1C 7.7*   Fasting Lipid Panel No results found for this basename: CHOL, HDL, LDLCALC, TRIG, CHOLHDL, LDLDIRECT,  in the last 72 hours Thyroid Function Tests No results found for this basename: TSH, T4TOTAL, FREET3, T3FREE, THYROIDAB,  in the last 72 hours  TELE  Normal sinus rhythm  ECG    Radiology/Studies  Ct Head Wo Contrast  06/12/2013   CLINICAL DATA:  stroke  EXAM: CT HEAD WITHOUT CONTRAST  TECHNIQUE: Contiguous axial images were obtained  from the base of the skull through the vertex without intravenous contrast.  COMPARISON:  Prior CT from 02/20/2012  FINDINGS: Mild generalized cerebral atrophy with chronic microvascular ischemic disease is present, similar to prior.  There is no acute intracranial hemorrhage or infarct. No mass lesion or midline shift. Gray-white matter differentiation is well maintained. Ventricles are normal in size without evidence of hydrocephalus. CSF containing spaces are within normal limits. No extra-axial fluid collection.  The calvarium is intact.  Orbital soft tissues are within normal limits.  Probable retention cyst noted within the partially visualized maxillary sinuses bilaterally. Scattered opacity noted within the ethmoidal air cells bilaterally. No mastoid effusion.  Scalp soft tissues are unremarkable.  IMPRESSION: 1. No acute intracranial process. 2. Mild generalized cerebral atrophy with chronic microvascular ischemic disease.   Electronically Signed   By: Rise Mu M.D.   On: 06/12/2013 01:53   Dg Chest Portable 1 View  06/09/2013   CLINICAL DATA:  Chest pain  EXAM: PORTABLE CHEST - 1 VIEW  COMPARISON:  Chest radiograph 10/21/2012  FINDINGS: There changes of median sternotomy for CABG. Left chest wall dual lead pacer appears stable in the frontal projection. Cardiomegaly is stable. There is pulmonary vascular congestion. There is a hazy opacity at the right lung base and the right costophrenic angle is blunted, suggesting a small right pleural effusion. . There is obscuration of the medial right hemidiaphragm. There are no focal opacities on the left. No visible pleural effusion on the left. The trachea is midline.  IMPRESSION: 1. Cardiomegaly and pulmonary vascular congestion. 2. Small right pleural effusion and right basilar atelectasis.   Electronically Signed   By: Britta Mccreedy M.D.   On: 06/09/2013 10:48    ASSESSMENT AND PLAN 1. Non-STEMI  2. CAD status post CABG     --cath this  admit   Severe native CAD as described. D. on grafted native circumflex has diffuse moderate to severe stenosis throughout, most notably in the distal OM branch. The most likely culprit lesion is the severe lesion in the SVG diagonal -- treated with successful DES stent PCI as described  Severe tandem lesions in the mid-distal SVG-RPDA  Severely elevated LVEDP, with systemic hypertension 3. Hypertension, poorly controlled 4. Chronic kidney disease, stage 3 5. Hyperlipidemia  6. Acute on chronic systolic heart failure. Last echocardiogram 11/07/12 showed ejection fraction 30-35%. New echo shows worsening EF 20-25%  7. COPD with ongoing tobacco abuse 8. Hypokalemia 9. H/o CVAs with presume peri-procedural TIA with cath this admit   Neck veins markedly elevated and SBP high. Will  treat with IV lasix. Increase losartan. Can go to tele. Pending carotid Doppler.    Signed, Dolores Patty MD

## 2013-06-13 NOTE — Progress Notes (Signed)
Stroke Team Progress Note  HISTORY Arnoldo LenisRoger D Rody is an 68 y.o. male with a past medical history significant for HTN, hyperlipidemia, type 2 DM, coronary disease status post CABG in 2010, current smoking, status post pacemaker placement, severe PAD status post a lateral femoral artery endarterectomies and fem-fem bypass, chronic congestive heart failure, bilateral carotid disease, TIAs, ischemic stroke with residual dysarthria, who was admitted on 4/7 with U/A and underwent cath and PCI today, noted to have word finding difficulty and right face droop tonight.  Nursing staff stated that around 11 pm tonight she noticed that patient was having difficulty findings some words out and also had droopiness of the right face which was not not previously reported. Nurse performed neuro-exam and reported NHSS 3.  He denies weakness, numbness, double vision, vertigo, difficulty swallowing, confusion, or visual disturbance.  Currently on IV heparin.  Date last known well: 06/12/13.  Time last known well: unknown.   Patient was not administered TPA secondary to iv heparin being on board, transient and mild symptoms   SUBJECTIVE His wife is at the bedside.  Overall he feels his condition is completely resolved.    OBJECTIVE Most recent Vital Signs: Filed Vitals:   06/13/13 0405 06/13/13 0748 06/13/13 0948 06/13/13 1155  BP: 136/71 151/90 161/81 165/86  Pulse: 71 83  74  Temp: 97.4 F (36.3 C) 97.7 F (36.5 C)  97.8 F (36.6 C)  TempSrc: Oral Oral  Oral  Resp: 16     Height:      Weight: 164 lb 14.5 oz (74.8 kg)     SpO2: 95% 93%  95%   CBG (last 3)   Recent Labs  06/11/13 2316 06/12/13 2202  GLUCAP 199* 173*    IV Fluid Intake:   . sodium chloride Stopped (06/12/13 1240)    MEDICATIONS  . albuterol  2.5 mg Nebulization BID  . amLODipine  5 mg Oral Daily  . aspirin EC  81 mg Oral Daily  . atorvastatin  80 mg Oral q1800  . carvedilol  6.25 mg Oral Q supper  . clopidogrel  75 mg Oral  Q breakfast  . ezetimibe  10 mg Oral Daily  . famotidine  10 mg Oral Daily  . furosemide  40 mg Intravenous BID  . [START ON 06/14/2013] losartan  100 mg Oral Daily  . losartan  50 mg Oral Once  . nitroGLYCERIN  1 inch Topical 3 times per day  . pantoprazole  40 mg Oral Daily  . potassium chloride  40 mEq Oral Once  . spironolactone  12.5 mg Oral Daily   PRN:  albuterol, hydrALAZINE, meclizine, nitroGLYCERIN  Diet:  Cardiac   Activity:  Bedrest  DVT Prophylaxis: iv heparin  CLINICALLY SIGNIFICANT STUDIES Basic Metabolic Panel:   Recent Labs Lab 06/12/13 0347 06/13/13 0400  NA 142 143  K 3.7 3.3*  CL 99 101  CO2 26 25  GLUCOSE 127* 107*  BUN 11 9  CREATININE 1.72* 1.52*  CALCIUM 9.3 9.0   Liver Function Tests:   Recent Labs Lab 06/09/13 1037  AST 12  ALT 7  ALKPHOS 113  BILITOT 0.9  PROT 7.8  ALBUMIN 3.4*   CBC:   Recent Labs Lab 06/11/13 0328 06/12/13 0347  WBC 5.4 6.4  HGB 11.7* 12.2*  HCT 35.7* 36.8*  MCV 83.0 83.6  PLT 225 253   Coagulation:   Recent Labs Lab 06/09/13 1037  LABPROT 14.6  INR 1.16   Cardiac Enzymes:   Recent  Labs Lab 06/09/13 1250 06/10/13 1320 06/10/13 2005  TROPONINI 0.52* <0.30 <0.30   Urinalysis: No results found for this basename: COLORURINE, APPERANCEUR, LABSPEC, PHURINE, GLUCOSEU, HGBUR, BILIRUBINUR, KETONESUR, PROTEINUR, UROBILINOGEN, NITRITE, LEUKOCYTESUR,  in the last 168 hours Lipid Panel    Component Value Date/Time   CHOL 122 06/10/2013 0333   TRIG 89 06/10/2013 0333   HDL 28* 06/10/2013 0333   CHOLHDL 4.4 06/10/2013 0333   VLDL 18 06/10/2013 0333   LDLCALC 76 06/10/2013 0333   HgbA1C  Lab Results  Component Value Date   HGBA1C 7.7* 06/12/2013    Urine Drug Screen:     Component Value Date/Time   LABOPIA NONE DETECTED 08/16/2007 0648   COCAINSCRNUR NONE DETECTED 08/16/2007 0648   LABBENZ NONE DETECTED 08/16/2007 0648   AMPHETMU NONE DETECTED 08/16/2007 0648   THCU NONE DETECTED 08/16/2007 0648   LABBARB   Value: NONE DETECTED        DRUG SCREEN FOR MEDICAL PURPOSES ONLY.  IF CONFIRMATION IS NEEDED FOR ANY PURPOSE, NOTIFY LAB WITHIN 5 DAYS. 08/16/2007 0648    Alcohol Level: No results found for this basename: ETH,  in the last 168 hours  Ct Head Wo Contrast  06/12/2013   CLINICAL DATA:  stroke  EXAM: CT HEAD WITHOUT CONTRAST  TECHNIQUE: Contiguous axial images were obtained from the base of the skull through the vertex without intravenous contrast.  COMPARISON:  Prior CT from 02/20/2012  FINDINGS: Mild generalized cerebral atrophy with chronic microvascular ischemic disease is present, similar to prior.  There is no acute intracranial hemorrhage or infarct. No mass lesion or midline shift. Gray-white matter differentiation is well maintained. Ventricles are normal in size without evidence of hydrocephalus. CSF containing spaces are within normal limits. No extra-axial fluid collection.  The calvarium is intact.  Orbital soft tissues are within normal limits.  Probable retention cyst noted within the partially visualized maxillary sinuses bilaterally. Scattered opacity noted within the ethmoidal air cells bilaterally. No mastoid effusion.  Scalp soft tissues are unremarkable.  IMPRESSION: 1. No acute intracranial process. 2. Mild generalized cerebral atrophy with chronic microvascular ischemic disease.   Electronically Signed   By: Rise Mu M.D.   On: 06/12/2013 01:53    CT of the brain    MRI of the brain  Cannot be done due to pacemaker    2D Echocardiogram  Left ventricle: The cavity size was normal. Systolic function was severely reduced. The estimated ejection fraction was in the range of 20% to 25%. Diffuse hypokinesis. There is severe Hypokinesis of the anteroseptal myocardium. There is akinesis of the entire anterolateral myocardium.   Carotid Doppler   Preliminary report: 1-39% right ICA stenosis. >80% left ICA stenosis. Vertebral artery flow is antegrade  CXR 06/09/2013 1.  Cardiomegaly and pulmonary vascular congestion.  2. Small right pleural effusion and right basilar atelectasis.    EKG  Sinus rhythm with 1st degree A-V block Possible Left atrial enlargement Left ventricular hypertrophy with QRS widening and repolarization  Therapy Recommendations on hold you to cardiac condition  Physical Exam  General: The patient is alert and cooperative at the time of the examination.  Skin: No significant peripheral edema is noted.   Neurologic Exam  Mental status: The patient is oriented x 3.  Cranial nerves: Facial symmetry is present. Speech is normal, no aphasia or dysarthria is noted. Extraocular movements are full. Visual fields are full.  Motor: The patient has good strength in all 4 extremities.  Sensory examination: Soft touch  sensation of the face, arms, and legs is symmetric.  Coordination: The patient has good finger-nose-finger and heel-to-shin bilaterally.  Gait and station: The gait was not tested.  Reflexes: Deep tendon reflexes are symmetric.   ASSESSMENT Mr. JAMORION GOMILLION is a 68 y.o. male presenting with facial droop and  Dysarthria s/p cardiac intervention possibly small infarct versus TIA etiology procedure related.   On heparin prior to admission. Now on heparin for secondary stroke prevention. Patient with resultant  No deficits. Stroke work up underway.    LDL 76 mg%      Hospital day # 4  TREATMENT/PLAN  Aspirin and plavix therapy  Check carotid dopplers done, left ICA stenosis > 80%, will get vascular surgery to see  Mobilize out of bed when ok per cardiology  D/W wife    York Spaniel  06/13/2013 12:04 PM  I    To contact Stroke Continuity provider, please refer to [email or web address]. After hours, contact General Neurology

## 2013-06-13 NOTE — Progress Notes (Signed)
eLink Physician-Brief Progress Note Patient Name: [name] DOB: August 29, 1945 MRN: [id number]  Date of Service  06/13/2013   HPI/Events of Note   Pt was on heparin gtt, but has since been d/c'ed.  Needs DVT prophylaxis.   eICU Interventions  Will start SCD's.  Defer to rounding team about whether he needs to have heparin gtt resumed.   Intervention Category Intermediate Interventions: Best-practice therapies (e.g. DVT, beta blocker, etc.)  Shelby Anderle 06/13/2013, 2:31 AM

## 2013-06-14 ENCOUNTER — Encounter (HOSPITAL_COMMUNITY): Payer: Self-pay | Admitting: Physician Assistant

## 2013-06-14 DIAGNOSIS — J449 Chronic obstructive pulmonary disease, unspecified: Secondary | ICD-10-CM

## 2013-06-14 DIAGNOSIS — E876 Hypokalemia: Secondary | ICD-10-CM

## 2013-06-14 DIAGNOSIS — G459 Transient cerebral ischemic attack, unspecified: Secondary | ICD-10-CM

## 2013-06-14 DIAGNOSIS — I255 Ischemic cardiomyopathy: Secondary | ICD-10-CM

## 2013-06-14 LAB — BASIC METABOLIC PANEL
BUN: 9 mg/dL (ref 6–23)
CALCIUM: 9 mg/dL (ref 8.4–10.5)
CO2: 28 mEq/L (ref 19–32)
CREATININE: 1.59 mg/dL — AB (ref 0.50–1.35)
Chloride: 101 mEq/L (ref 96–112)
GFR calc Af Amer: 51 mL/min — ABNORMAL LOW (ref >90)
GFR, EST NON AFRICAN AMERICAN: 44 mL/min — AB (ref >90)
GLUCOSE: 119 mg/dL — AB (ref 70–99)
Potassium: 3.6 mEq/L — ABNORMAL LOW (ref 3.7–5.3)
Sodium: 143 mEq/L (ref 137–147)

## 2013-06-14 MED ORDER — CARVEDILOL 6.25 MG PO TABS: 6.2500 mg | ORAL_TABLET | Freq: Two times a day (BID) | ORAL | Status: AC

## 2013-06-14 MED ORDER — LOSARTAN POTASSIUM 100 MG PO TABS: 100.0000 mg | ORAL_TABLET | Freq: Every day | ORAL | Status: AC

## 2013-06-14 MED ORDER — SPIRONOLACTONE 25 MG PO TABS: 12.5000 mg | ORAL_TABLET | Freq: Every day | ORAL | Status: AC

## 2013-06-14 MED ORDER — POTASSIUM CHLORIDE ER 20 MEQ PO TBCR: 20.0000 meq | EXTENDED_RELEASE_TABLET | Freq: Two times a day (BID) | ORAL | Status: DC

## 2013-06-14 MED ORDER — FUROSEMIDE 40 MG PO TABS: 40.0000 mg | ORAL_TABLET | Freq: Two times a day (BID) | ORAL | Status: AC

## 2013-06-14 NOTE — Progress Notes (Signed)
Patient Name: [name] Date of Encounter: 06/14/2013     Active Problems:   Unstable angina   Acute on chronic systolic heart failure    SUBJECTIVE   68 y.o. male with a PMHx of CAD, CABG, systolic HF, CVA, CKD, , HTN, hyperlipidemia, pacer, COPD , who was admitted to [facility] on 06/09/2013 for evaluation of [place]   Echo EF 20-25% (previously 30-35%)  Cath 4/9 Severe native CAD as described. D. on grafted native circumflex has diffuse moderate to severe stenosis throughout, most notably in the distal OM branch. The most likely culprit lesion is the severe lesion in the SVG diagonal -- treated with successful DES stent PCI as described  Severe tandem lesions in the mid-distal SVG-RPDA  Severely elevated LVEDP (35), with systemic hypertension LIMA patent SVG - OM totally occluded  Initial plan post cath was for diuresis and staged PCI of SVG-PDA but patient developed slurred speech and facial droop post procedure suggestive of TIA. Seen by Neuro.  Yesterday moved out of CCU. Lasix increased for volume overload. Anti-HTN meds increased. Carotid u/s with > 80% LICA. Seen by vascular surgery plan intervention in 6-8 weeks. Diuresed about 1.5L . Weight down 2 pounds. Denies dyspnea. Renal function stable. SBP much improved. Wants to go home.    CURRENT MEDS . albuterol  2.5 mg Nebulization BID  . amLODipine  5 mg Oral Daily  . aspirin EC  81 mg Oral Daily  . atorvastatin  80 mg Oral q1800  . carvedilol  6.25 mg Oral Q supper  . clopidogrel  75 mg Oral Q breakfast  . ezetimibe  10 mg Oral Daily  . famotidine  10 mg Oral Daily  . furosemide  40 mg Intravenous BID  . losartan  100 mg Oral Daily  . nitroGLYCERIN  1 inch Topical 3 times per day  . pantoprazole  40 mg Oral Daily  . spironolactone  12.5 mg Oral Daily    OBJECTIVE  Filed Vitals:   06/13/13 2106 06/14/13 0141 06/14/13 0554 06/14/13 0835  BP: 107/67 108/70 127/65   Pulse: 68 70 82   Temp:  98.6 F (37 C) 98.3  F (36.8 C)   TempSrc: Oral Oral Oral   Resp: 18 18 18    Height:      Weight:   73.891 kg (162 lb 14.4 oz)   SpO2: 98% 97% 100% 95%    Intake/Output Summary (Last 24 hours) at 06/14/13 1312 Last data filed at 06/14/13 0844  Gross per 24 hour  Intake    540 ml  Output   1525 ml  Net   -985 ml   Filed Weights   06/13/13 0405 06/13/13 1615 06/14/13 0554  Weight: 74.8 kg (164 lb 14.5 oz) 74.662 kg (164 lb 9.6 oz) 73.891 kg (162 lb 14.4 oz)    PHYSICAL EXAM  General: Pleasant, NAD.  Neuro: Alert and oriented X 3. Moves all extremities spontaneously.  Speech is slightly slurred.  Left-sided droop Psych: Normal affect for him. HEENT: Normal  Neck: Supple without bruits JVP 7 Lungs: Resp regular and unlabored, CTA.  Heart: RRR no s3, s4,+2/6 MR Abdomen: Soft, non-tender, non-distended, BS + x 4.  Extremities: No clubbing, cyanosis or edema. Pedal pulses equivocal.  Weak left radial pulse.  Mild ecchymosis left forearm.  Accessory Clinical Findings  CBC  Recent Labs  06/12/13 0347  WBC 6.4  HGB 12.2*  HCT 36.8*  MCV 83.6  PLT 253   Basic Metabolic Panel  Recent Labs  06/13/13 0400 06/14/13 0547  NA 143 143  K 3.3* 3.6*  CL 101 101  CO2 25 28  GLUCOSE 107* 119*  BUN 9 9  CREATININE 1.52* 1.59*  CALCIUM 9.0 9.0   Liver Function Tests No results found for this basename: AST, ALT, ALKPHOS, BILITOT, PROT, ALBUMIN,  in the last 72 hours No results found for this basename: LIPASE, AMYLASE,  in the last 72 hours Cardiac Enzymes No results found for this basename: CKTOTAL, CKMB, CKMBINDEX, TROPONINI,  in the last 72 hours BNP No components found with this basename: POCBNP,  D-Dimer No results found for this basename: DDIMER,  in the last 72 hours Hemoglobin A1C  Recent Labs  06/12/13 1610  HGBA1C 7.7*   Fasting Lipid Panel No results found for this basename: CHOL, HDL, LDLCALC, TRIG, CHOLHDL, LDLDIRECT,  in the last 72 hours Thyroid Function Tests No  results found for this basename: TSH, T4TOTAL, FREET3, T3FREE, THYROIDAB,  in the last 72 hours  TELE  Normal sinus rhythm  ECG    Radiology/Studies  Ct Head Wo Contrast  06/12/2013   CLINICAL DATA:  stroke  EXAM: CT HEAD WITHOUT CONTRAST  TECHNIQUE: Contiguous axial images were obtained from the base of the skull through the vertex without intravenous contrast.  COMPARISON:  Prior CT from 02/20/2012  FINDINGS: Mild generalized cerebral atrophy with chronic microvascular ischemic disease is present, similar to prior.  There is no acute intracranial hemorrhage or infarct. No mass lesion or midline shift. Gray-white matter differentiation is well maintained. Ventricles are normal in size without evidence of hydrocephalus. CSF containing spaces are within normal limits. No extra-axial fluid collection.  The calvarium is intact.  Orbital soft tissues are within normal limits.  Probable retention cyst noted within the partially visualized maxillary sinuses bilaterally. Scattered opacity noted within the ethmoidal air cells bilaterally. No mastoid effusion.  Scalp soft tissues are unremarkable.  IMPRESSION: 1. No acute intracranial process. 2. Mild generalized cerebral atrophy with chronic microvascular ischemic disease.   Electronically Signed   By: Rise Mu M.D.   On: 06/12/2013 01:53   Dg Chest Portable 1 View  06/09/2013   CLINICAL DATA:  Chest pain  EXAM: PORTABLE CHEST - 1 VIEW  COMPARISON:  Chest radiograph 10/21/2012  FINDINGS: There changes of median sternotomy for CABG. Left chest wall dual lead pacer appears stable in the frontal projection. Cardiomegaly is stable. There is pulmonary vascular congestion. There is a hazy opacity at the right lung base and the right costophrenic angle is blunted, suggesting a small right pleural effusion. . There is obscuration of the medial right hemidiaphragm. There are no focal opacities on the left. No visible pleural effusion on the left. The  trachea is midline.  IMPRESSION: 1. Cardiomegaly and pulmonary vascular congestion. 2. Small right pleural effusion and right basilar atelectasis.   Electronically Signed   By: Britta Mccreedy M.D.   On: 06/09/2013 10:48    ASSESSMENT AND PLAN 1. Non-STEMI  2. CAD status post CABG     --cath this admit   Severe native CAD as described. D. on grafted native circumflex has diffuse moderate to severe stenosis throughout, most notably in the distal OM branch. The most likely culprit lesion is the severe lesion in the SVG diagonal -- treated with successful DES stent PCI as described  Severe tandem lesions in the mid-distal SVG-RPDA  Severely elevated LVEDP, with systemic hypertension 3. Hypertension, poorly controlled 4. Chronic kidney disease, stage 3 5.  Hyperlipidemia  6. Acute on chronic systolic heart failure. Last echocardiogram 11/07/12 showed ejection fraction 30-35%. New echo shows worsening EF 20-25%  7. COPD with ongoing tobacco abuse 8. Hypokalemia 9. H/o CVAs with presume peri-procedural TIA with cath this admit 10. Carotid stenosis > 80% on L  Much improved today. Can go home.  Will need careful outpatient coordination to address timing of possible staged intervention on SVG-PDA as well as carotid endarterectomy.   Will also need f/u with EP to discuss need for ICD as outpatient  Does not have lasix on home med list. Would add lasix 40 bid with kcl 20 bid. Spiro 12.5 daily. Stop amlodipine. Increase losartan to 100 daily. Will need BMET and office f/u this week.   D/c meds: Asa 81 Plavix 75 Losartan 100 Spiro 12.5 Lasix 40 bid kcl 20 bid Carvedilol 6.25 bid atorva 80 zetia 10 Dexilant    Can stop: amlodipine, zantac    Signed, Dolores Pattyaniel R Bensimhon MD

## 2013-06-14 NOTE — Discharge Summary (Signed)
Discharge Summary   Patient ID: [name], MRN: [id number], DOB/AGE: 66/26/1947 66 y.o.  Admit date: 06/09/2013 Discharge date: 06/14/2013   Primary Care Physician:  [name]   Primary Cardiologist:  Dr. Cassell Snyder  Primary Electrophysiologist:  Dr. Lewayne Snyder   Reason for Admission:  Non-STEMI   Primary Discharge Diagnoses:  Principal Problem:   NSTEMI (non-ST elevated myocardial infarction) Active Problems:   HYPERTENSION   CAD   TIA   Tobacco abuse   Dyslipidemia   Carotid stenosis   Chronic kidney disease, stage 3   Acute on chronic systolic heart failure   Cardiomyopathy, ischemic   COPD (chronic obstructive pulmonary disease)   Hypokalemia     Wt Readings from Last 3 Encounters:  06/14/13 162 lb 14.4 oz (73.891 kg)  06/14/13 162 lb 14.4 oz (73.891 kg)  05/14/13 176 lb 3.2 oz (79.924 kg)    Secondary Discharge Diagnoses:   Past Medical History  Diagnosis Date  . Hyperlipidemia   . Vitamin B12 deficiency   . Hypertensive heart disease 09/20/2008    Qualifier: Diagnosis of  By: Flonnie Overman    . CAD     a. s/p CABG;  b. NSTEMI 06/2013 - LHC:  LIMA-LAD patent, SVG-D1 with 99% subtotal occlusion, SVG-OM occluded, proximal to mid circumflex 60-70%, terminal OM 70-80%, both OM branches grafted were occluded, proximal RCA occluded, SVG-right PDA with mid to distal 80-90% and more distal 70%, RPL1 proximal 60% => s/p DES to S-Dx - c/b TIA - staged intervention of S-RPDA placed on hold  . BRADYCARDIA     s/p pacer  . 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 TIA 09/20/2008    Qualifier: Diagnosis of  By: Flonnie Overman    . PSORIASIS 09/20/2008    Qualifier: Diagnosis of  By: Flonnie Overman    . Intractable hiccoughs 08/03/2010  . Dyslipidemia 09/05/2011  . GERD (gastroesophageal reflux disease) 11/06/2011  . Nephrolithiasis 11/06/2011  . Carotid stenosis 11/06/2011    Bilat 60-80% 2010;  Carotid US  (06/2013):  RICA 1-39%;  LICA > 80% -  needs L CEA in 6-8 weeks  . Chronic systolic heart failure   . DVT (deep venous thrombosis)   . Type II diabetes mellitus   . History of stomach ulcers   . Cerebellar stroke, acute ~ 2009    residual:  "hiccoughs" (06/09/2013)  . Mini stroke     "several/dr" (06/09/2013)  . Tobacco abuse   . PAD (peripheral artery disease)   . Ischemic cardiomyopathy     a. Echo (06/2013):  EF 20-25%, severe HK of anteroseptum, AK of anterolateral wall, some degree of AS, mild MR, mod LAE, mod TR      Allergies:    Allergies  Allergen Reactions  . Imdur [Isosorbide Mononitrate]     HEADACHES   . Metformin And Related     ABDOMINAL CRAMPS      Procedures Performed This Admission:    Cardiac Catheterization and Percutaneous Coronary Intervention (06/11/13): Left Ventriculography: Not Done  EF: 20-25 % by Echocardiogram Coronary Anatomy:  Left Main: Large-caliber vessel that bifurcates into the LAD and Circumflex. Mild calcification but no significant lesions. LAD: Was likely a large vessel but is currently a moderate caliber vessel with diffuse proximal stenosis of roughly 40-50% prior to a large first septal perforator trunk. This occurs at the site of a 100% occluded mid LAD stent. The perforator trunk  is a major branch that has a least smaller moderate caliber. The ostium has a 90% stenosis.  LIMA-LAD: Large-caliber widely patent graft to a moderate caliber distal LAD with diffuse mild to moderate luminal irregularities as it wraps the apex.  D1: Fills via a patent SVG  SVG-D1: Large-caliber vessel which feeds a small native diagonal branch just after the occluded LAD stent. There is a focal mid graft 99% subtotal occlusion followed by normal graft distally. Left Circumflex: Begin a moderate caliber vessel with several tortuous segments proximal and mid with several tandem 60-70% stenoses. There are short stop segments of the previously grafted OM's visualized.  The AV groove circumflex bifurcates distally into a small posterior lateral branch in the last terminal OM. The terminal OM has a tubular 70-80% stenosis in a less than 2 mm vessel.  Both significant OM branches that were grafted or occluded  SVG-OM-OM: 100% flush occluded at the aorto-ostium RCA: Proximally occluded after a SA node branch  SVG-RPDA: Large-caliber graft with diffuse mild irregularities proximally. In the mid to distal segment there is a irregular hazy appearing 80-90% stenosis that has 2 sections all by a more distal 70% stenosis that is focal.  The graft fills a PDA which is moderate caliber with diffuse mild/moderate luminal irregularities. Retrograde filling fills a very short segment of the distal RCA before were seen to the right posterior AV groove branch which made to the RPL 1. RPL 1 has a tubular 60% stenosis in the proximal segment;  PCI:  Integrity resolute DES (3.5 x 15 mm) to SVG-diagonal     Hospital Course:  [name] is a 68 y.o. male smoker with a history of CAD status post CABG, ICM, systolic CHF, PAD, prior stroke, CKD, HTN, HL, status post pacemaker, COPD.  He presented to the hospital with complaints of chest discomfort and severe dyspnea.  Cardiac markers were minimally elevated suggestive of non-STEMI.  Echocardiogram demonstrated worsening LV function with an EF of 20-25%.  He was gently hydrated to improve his kidney function. He was then referred for cardiac catheterization.  Catheterization demonstrated a patent LIMA-LAD patent, SVG-D1 with 99% subtotal occlusion, SVG-OM occluded, proximal to mid circumflex 60-70%, terminal OM 70-80%, both OM branches grafted were occluded, proximal RCA occluded, SVG-right PDA with mid to distal 80-90% and more distal 70%, RPL1 proximal 60%. Culprit for his non-STEMI was felt to be the 99% lesion in the SVG-diagonal. There were significant lesions noted in the RCA and S-RPDA as well as the native and grafts to the  circumflex. PCI was performed of the vein graft to the diagonal with a DES. Plan was to pursue staged intervention of the SVG-RPDA and possibly medical therapy for the native circumflex based on the extent of the diffuse moderate to severe disease.  However, after PCI, the patient developed right facial droop.  He was seen by neurology. He was also noted to have word finding difficulty but this resolved. He was not a candidate for thrombolysis due to a major procedure (PCI), IV heparin.  Head CT was negative for bleed.  He was felt to have a possible small infarct versus TIA. Patient developed signs of volume excess. He was treated with IV Lasix.  Carotid Dopplers demonstrated 1-39% ICA and >80% LICA.  He was seen by vascular surgery. Given that the patient suffered a left brain event and has severe left ICA stenosis, it was recommended that he undergo left carotid endarterectomy. However, this would need to be postponed  for 6-8 weeks after recent PCI. He would need cardiac clearance prior to proceeding. Outpatient followup will be scheduled with Dr. Imogene Burnhen.  Patient was seen by Dr. Gala RomneyBensimhon today. He was improved and felt stable enough for discharge to home. The patient will need careful outpatient coordination to address timing of possible staged intervention of the SVG-R PDA as well as carotid endarterectomy. He will also need followup with EP to discuss need for ICD implantation.     Discharge Vitals:   Blood pressure 109/67, pulse 69, temperature 98.4 F (36.9 C), temperature source Oral, resp. rate 20, height 5\' 6"  (1.676 m), weight 162 lb 14.4 oz (73.891 kg), SpO2 100.00%.   Labs:   Recent Labs  06/12/13 0347  WBC 6.4  HGB 12.2*  HCT 36.8*  MCV 83.6  PLT 253     Recent Labs  06/12/13 0347 06/13/13 0400 06/14/13 0547  NA 142 143 143  K 3.7 3.3* 3.6*  CL 99 101 101  CO2 26 25 28   BUN 11 9 9   CREATININE 1.72* 1.52* 1.59*  CALCIUM 9.3 9.0 9.0     Lab Results  Component Value  Date   CHOL 122 06/10/2013   HDL 28* 06/10/2013   LDLCALC 76 06/10/2013   TRIG 89 06/10/2013    Lab Results  Component Value Date   TSH 4.720* 06/09/2013     Diagnostic Procedures and Studies:  Ct Head Wo Contrast  06/12/2013      IMPRESSION: 1. No acute intracranial process. 2. Mild generalized cerebral atrophy with chronic microvascular ischemic disease.   Electronically Signed   By: Rise MuBenjamin  McClintock M.D.   On: 06/12/2013 01:53    Dg Chest Portable 1 View  06/09/2013    IMPRESSION: 1. Cardiomegaly and pulmonary vascular congestion. 2. Small right pleural effusion and right basilar atelectasis.   Electronically Signed   By: Britta MccreedySusan  Turner M.D.   On: 06/09/2013 10:48     Carotid US (06/13/2013:  Summary: Right: moderate soft plaque throughout CCA. Mild to moderate soft plaque origin ICA. 1-39% ICA stenosis. Left: moderate to severe mixed plaque origin ICA with acoustic shadowing. >80% ICA stenosis. Bilateral vertebral artery flow is antegrade. ICA/CCA ratio: R-1.27 L-3.71    2D Echocardiogram (06/10/2013):  Study Conclusions  - Left ventricle: The cavity size was normal. Systolic function was severely reduced. The estimated ejection fraction was in the range of 20% to 25%. Diffuse hypokinesis. There is severe Hypokinesis of the anteroseptal myocardium. There is akinesis of the entire anterolateral myocardium. - Aortic valve: no significant AV gradient was noted on doppler analysis but suspect that patient has some degree of AS with normal gradient due to low CO with severe LV dysfunction Severe diffuse thickening and calcification. - Mitral valve: Mild regurgitation. - Left atrium: The atrium was moderately dilated. - Tricuspid valve: Moderate regurgitation.    Disposition:   Pt is being discharged home today in good condition.  Follow-up Plans & Appointments      Follow-up Information   Follow up with [facility] In 3  days. (office will call to arrange lab test (BMET))    Contact information:   [address] [place] [id number] [phone]      Follow up with Jacob Clementhomas Brackbill, MD In 1 week. (office will call to arrange appointment)    Specialty:  Cardiology   Contact information:   [address] [place] [address] [phone]  Follow up with Jacob Bunting, MD. (we will arrange follow up with Dr. Ladona Ridgel)    Specialty:  Cardiology   Contact information:   [address] [place] [address] [phone]       Follow up with Jacob Barre, MD. (please call for follow up)    Specialties:  Internal Medicine, Radiology   Contact information:   [address] [place] [address] [phone]       Follow up with Nilda Simmer, MD. (Dr. Nicky Pugh office should contact you for follow up.  If you do not hear anything by 4/14, call their office)    Specialty:  Vascular Surgery   Contact information:   [address] [place] [address] [phone]       Discharge Medications    Medication List    STOP taking these medications       amLODipine 5 MG tablet  Commonly known as:  NORVASC     ranitidine 150 MG tablet  Commonly known as:  ZANTAC      TAKE these medications       aspirin 81 MG tablet  Take 81 mg by mouth daily.     atorvastatin 80 MG tablet  Commonly known as:  LIPITOR  Take 80 mg by mouth daily.     carvedilol 6.25 MG tablet  Commonly known as:  COREG  Take 1 tablet (6.25 mg total) by mouth 2 (two) times daily with a meal.     clopidogrel 75 MG tablet  Commonly known as:  PLAVIX  Take 75 mg by mouth daily with breakfast.     DEXILANT 60 MG capsule  Generic drug:  dexlansoprazole  Take 60 mg by mouth daily.     ezetimibe 10 MG tablet  Commonly known as:  ZETIA  Take 10 mg by mouth daily.     furosemide 40 MG tablet  Commonly known as:  LASIX  Take 1 tablet (40 mg total) by mouth 2 (two)  times daily.     losartan 100 MG tablet  Commonly known as:  COZAAR  Take 1 tablet (100 mg total) by mouth daily.     meclizine 25 MG tablet  Commonly known as:  ANTIVERT  Take 25 mg by mouth 3 (three) times daily as needed for dizziness. Patient has been taking 1 tablet every day     nitroGLYCERIN 0.4 MG SL tablet  Commonly known as:  NITROSTAT  Place 0.4 mg under the tongue every 5 (five) minutes as needed. For chest pain     Potassium Chloride ER 20 MEQ Tbcr  Take 20 mEq by mouth 2 (two) times daily.     spironolactone 25 MG tablet  Commonly known as:  ALDACTONE  Take 0.5 tablets (12.5 mg total) by mouth daily.         Outstanding Labs/Studies  1. BMET 4/14 or 4/15 2. Consider timing of PCI of S-RPDA 3. Consider timing of L CEA 4. Arrange f/u with EP to discuss possible ICD   Duration of Discharge Encounter: Greater than 30 minutes including physician and PA time.  Signed, Tereso Newcomer, PA-C   06/14/2013 4:55 PM  There is a  Patient seen and examined with Tereso Newcomer PA-C. We discussed all aspects of the encounter. I agree with the assessment and plan as stated above. Please see my rounding note for further details. Will need outpatient coordination of his staged PCI and CEA.   Bevelyn Buckles Bensimhon,MD 1:05  PM   

## 2013-06-14 NOTE — Progress Notes (Signed)
Stroke Team Progress Note  HISTORY [name] is an 68 y.o. male with a past medical history significant for HTN, hyperlipidemia, type 2 DM, coronary disease status post CABG in 2010, current smoking, status post pacemaker placement, severe PAD status post a lateral femoral artery endarterectomies and fem-fem bypass, chronic congestive heart failure, bilateral carotid disease, TIAs, ischemic stroke with residual dysarthria, who was admitted on 4/7 with U/A and underwent cath and PCI today, noted to have word finding difficulty and right face droop tonight.  Nursing staff stated that around 11 pm tonight she noticed that patient was having difficulty findings some words out and also had droopiness of the right face which was not not previously reported. Nurse performed neuro-exam and reported NHSS 3.  He denies weakness, numbness, double vision, vertigo, difficulty swallowing, confusion, or visual disturbance.  Currently on IV heparin.  Date last known well: 06/12/13.  Time last known well: unknown.   Patient was not administered TPA secondary to iv heparin being on board, transient and mild symptoms   SUBJECTIVE His wife is at the bedside.  Overall he feels his condition is completely resolved.    OBJECTIVE Most recent Vital Signs: Filed Vitals:   06/13/13 2106 06/14/13 0141 06/14/13 0554 06/14/13 0835  BP: 107/67 108/70 127/65   Pulse: 68 70 82   Temp:  98.6 F (37 C) 98.3 F (36.8 C)   TempSrc: Oral Oral Oral   Resp: 18 18 18    Height:      Weight:   162 lb 14.4 oz (73.891 kg)   SpO2: 98% 97% 100% 95%   CBG (last 3)   Recent Labs  06/11/13 2316 06/12/13 2202 06/13/13 1739  GLUCAP 199* 173* 140*    IV Fluid Intake:   . sodium chloride Stopped (06/12/13 1240)    MEDICATIONS  . albuterol  2.5 mg Nebulization BID  . amLODipine  5 mg Oral Daily  . aspirin EC  81 mg Oral Daily  . atorvastatin  80 mg Oral q1800  . carvedilol  6.25 mg Oral Q supper  . clopidogrel  75 mg  Oral Q breakfast  . ezetimibe  10 mg Oral Daily  . famotidine  10 mg Oral Daily  . furosemide  40 mg Intravenous BID  . losartan  100 mg Oral Daily  . nitroGLYCERIN  1 inch Topical 3 times per day  . pantoprazole  40 mg Oral Daily  . spironolactone  12.5 mg Oral Daily   PRN:  albuterol, hydrALAZINE, meclizine, nitroGLYCERIN  Diet:  Cardiac   Activity:  Bedrest  DVT Prophylaxis: iv heparin  CLINICALLY SIGNIFICANT STUDIES Basic Metabolic Panel:   Recent Labs Lab 06/13/13 0400 06/14/13 0547  NA 143 143  K 3.3* 3.6*  CL 101 101  CO2 25 28  GLUCOSE 107* 119*  BUN 9 9  CREATININE 1.52* 1.59*  CALCIUM 9.0 9.0   Liver Function Tests:   Recent Labs Lab 06/09/13 1037  AST 12  ALT 7  ALKPHOS 113  BILITOT 0.9  PROT 7.8  ALBUMIN 3.4*   CBC:   Recent Labs Lab 06/11/13 0328 06/12/13 0347  WBC 5.4 6.4  HGB 11.7* 12.2*  HCT 35.7* 36.8*  MCV 83.0 83.6  PLT 225 253   Coagulation:   Recent Labs Lab 06/09/13 1037  LABPROT 14.6  INR 1.16   Cardiac Enzymes:   Recent Labs Lab 06/09/13 1250 06/10/13 1320 06/10/13 2005  TROPONINI 0.52* <0.30 <0.30   Urinalysis: No results found for  this basename: COLORURINE, APPERANCEUR, LABSPEC, PHURINE, GLUCOSEU, HGBUR, BILIRUBINUR, KETONESUR, PROTEINUR, UROBILINOGEN, NITRITE, LEUKOCYTESUR,  in the last 168 hours Lipid Panel    Component Value Date/Time   CHOL 122 06/10/2013 0333   TRIG 89 06/10/2013 0333   HDL 28* 06/10/2013 0333   CHOLHDL 4.4 06/10/2013 0333   VLDL 18 06/10/2013 0333   LDLCALC 76 06/10/2013 0333   HgbA1C  Lab Results  Component Value Date   HGBA1C 7.7* 06/12/2013    Urine Drug Screen:     Component Value Date/Time   LABOPIA NONE DETECTED 08/16/2007 0648   COCAINSCRNUR NONE DETECTED 08/16/2007 0648   LABBENZ NONE DETECTED 08/16/2007 0648   AMPHETMU NONE DETECTED 08/16/2007 0648   THCU NONE DETECTED 08/16/2007 0648   LABBARB  Value: NONE DETECTED        DRUG SCREEN FOR MEDICAL PURPOSES ONLY.  IF CONFIRMATION IS  NEEDED FOR ANY PURPOSE, NOTIFY LAB WITHIN 5 DAYS. 08/16/2007 0648    Alcohol Level: No results found for this basename: ETH,  in the last 168 hours  No results found.  CT of the brain    MRI of the brain  Cannot be done due to pacemaker    2D Echocardiogram  Left ventricle: The cavity size was normal. Systolic function was severely reduced. The estimated ejection fraction was in the range of 20% to 25%. Diffuse hypokinesis. There is severe Hypokinesis of the anteroseptal myocardium. There is akinesis of the entire anterolateral myocardium.   Carotid Doppler   Preliminary report: 1-39% right ICA stenosis. >80% left ICA stenosis. Vertebral artery flow is antegrade  CXR 06/09/2013 1. Cardiomegaly and pulmonary vascular congestion.  2. Small right pleural effusion and right basilar atelectasis.    EKG  Sinus rhythm with 1st degree A-V block Possible Left atrial enlargement Left ventricular hypertrophy with QRS widening and repolarization  Therapy Recommendations on hold you to cardiac condition  Physical Exam  General: The patient is alert and cooperative at the time of the examination.  Skin: No significant peripheral edema is noted.   Neurologic Exam  Mental status: The patient is oriented x 3.  Cranial nerves: Facial symmetry is present. Speech is normal, no aphasia or dysarthria is noted. Extraocular movements are full. Visual fields are full.  Motor: The patient has good strength in all 4 extremities.  Sensory examination: Soft touch sensation of the face, arms, and legs is symmetric.  Coordination: The patient has good finger-nose-finger and heel-to-shin bilaterally.  Gait and station: The gait was not tested.  Reflexes: Deep tendon reflexes are symmetric.   ASSESSMENT [name] is a 68 y.o. male presenting with facial droop and  Dysarthria s/p cardiac intervention possibly small infarct versus TIA etiology procedure related.   On heparin prior to  admission. Now on heparin for secondary stroke prevention. Patient with resultant  No deficits. Stroke work up underway.    LDL 76 mg%      Hospital day # 5  TREATMENT/PLAN  Aspirin and plavix therapy  Check carotid dopplers done, left ICA stenosis > 80%, vascular surgery has seen  Possible discharge today, will sign off.   York SpanielCharles K Snyder  06/14/2013 10:25 AM  I    To contact Stroke Continuity provider, please refer to [email or web address]. After hours, contact General Neurology

## 2013-06-15 ENCOUNTER — Other Ambulatory Visit: Payer: Self-pay | Admitting: *Deleted

## 2013-06-15 ENCOUNTER — Telehealth: Payer: Self-pay | Admitting: Vascular Surgery

## 2013-06-15 DIAGNOSIS — I63239 Cerebral infarction due to unspecified occlusion or stenosis of unspecified carotid arteries: Secondary | ICD-10-CM

## 2013-06-15 DIAGNOSIS — Z01810 Encounter for preprocedural cardiovascular examination: Secondary | ICD-10-CM

## 2013-06-15 NOTE — Telephone Encounter (Addendum)
Message copied by Fredrich BirksMILLIKAN, DANA P on Mon Jun 15, 2013  3:04 PM ------      Message from: [place], [place] K      Created: Mon Jun 15, 2013  9:04 AM      Regarding: Schedule                   ----- Message -----         From: Pryor OchoaJames D Lawson, MD         Sent: 06/13/2013   3:03 PM           To: [facility] Charge Pool            06/13/2013      Hospital consults and Dr. Hart RochesterLawson      Severe left ICA stenosis with recent left brain TIAs and non-ST MI            Patient needs appointment to followup with Dr. Claudie Fishermanhin in 6 weeks with carotid duplex exam ------  06/15/13: lm for pt re appt, dpm

## 2013-06-16 ENCOUNTER — Encounter: Payer: Self-pay | Admitting: *Deleted

## 2013-06-16 ENCOUNTER — Telehealth: Payer: Self-pay | Admitting: Cardiology

## 2013-06-16 NOTE — Telephone Encounter (Signed)
Pt was called/ no complaints/ has all prescribed meds/ pt is aware of all upcoming app/ radial access site is dry/ no redness- denies problems with site. Encouraged to call with any questions or concerns.

## 2013-06-16 NOTE — Telephone Encounter (Signed)
New Message//TCM  Per after Hours, Pt is TCM Scheduled appt for June 19 2013 at 2;15 PM with NP Norma FredricksonLori Gerhardt.

## 2013-06-17 ENCOUNTER — Other Ambulatory Visit (INDEPENDENT_AMBULATORY_CARE_PROVIDER_SITE_OTHER): Payer: Medicare Other

## 2013-06-17 DIAGNOSIS — I119 Hypertensive heart disease without heart failure: Secondary | ICD-10-CM

## 2013-06-17 LAB — BASIC METABOLIC PANEL
BUN: 15 mg/dL (ref 6–23)
CHLORIDE: 99 meq/L (ref 96–112)
CO2: 33 meq/L — AB (ref 19–32)
Calcium: 9 mg/dL (ref 8.4–10.5)
Creatinine, Ser: 2.2 mg/dL — ABNORMAL HIGH (ref 0.4–1.5)
GFR: 31.4 mL/min — ABNORMAL LOW (ref >60.00)
Glucose, Bld: 180 mg/dL — ABNORMAL HIGH (ref 70–99)
POTASSIUM: 3.7 meq/L (ref 3.5–5.1)
Sodium: 137 mEq/L (ref 135–145)

## 2013-06-18 ENCOUNTER — Other Ambulatory Visit: Payer: Self-pay | Admitting: *Deleted

## 2013-06-18 DIAGNOSIS — R7989 Other specified abnormal findings of blood chemistry: Secondary | ICD-10-CM

## 2013-06-19 ENCOUNTER — Other Ambulatory Visit (INDEPENDENT_AMBULATORY_CARE_PROVIDER_SITE_OTHER): Payer: Medicare Other

## 2013-06-19 ENCOUNTER — Encounter: Payer: Self-pay | Admitting: Nurse Practitioner

## 2013-06-19 ENCOUNTER — Ambulatory Visit (INDEPENDENT_AMBULATORY_CARE_PROVIDER_SITE_OTHER): Payer: Medicare Other | Admitting: Nurse Practitioner

## 2013-06-19 VITALS — BP 120/70 | HR 66 | Ht 66.0 in | Wt 161.0 lb

## 2013-06-19 DIAGNOSIS — R7989 Other specified abnormal findings of blood chemistry: Secondary | ICD-10-CM

## 2013-06-19 DIAGNOSIS — N183 Chronic kidney disease, stage 3 unspecified: Secondary | ICD-10-CM

## 2013-06-19 DIAGNOSIS — I5021 Acute systolic (congestive) heart failure: Secondary | ICD-10-CM

## 2013-06-19 DIAGNOSIS — R0609 Other forms of dyspnea: Secondary | ICD-10-CM

## 2013-06-19 DIAGNOSIS — I5022 Chronic systolic (congestive) heart failure: Secondary | ICD-10-CM

## 2013-06-19 DIAGNOSIS — R0600 Dyspnea, unspecified: Secondary | ICD-10-CM

## 2013-06-19 DIAGNOSIS — I2510 Atherosclerotic heart disease of native coronary artery without angina pectoris: Secondary | ICD-10-CM

## 2013-06-19 DIAGNOSIS — R799 Abnormal finding of blood chemistry, unspecified: Secondary | ICD-10-CM

## 2013-06-19 DIAGNOSIS — I509 Heart failure, unspecified: Secondary | ICD-10-CM

## 2013-06-19 DIAGNOSIS — R0989 Other specified symptoms and signs involving the circulatory and respiratory systems: Secondary | ICD-10-CM

## 2013-06-19 LAB — BASIC METABOLIC PANEL
BUN: 15 mg/dL (ref 6–23)
CO2: 28 meq/L (ref 19–32)
CREATININE: 1.8 mg/dL — AB (ref 0.4–1.5)
Calcium: 8.9 mg/dL (ref 8.4–10.5)
Chloride: 101 mEq/L (ref 96–112)
GFR: 39.9 mL/min — ABNORMAL LOW (ref >60.00)
GLUCOSE: 186 mg/dL — AB (ref 70–99)
Potassium: 3.8 mEq/L (ref 3.5–5.1)
Sodium: 137 mEq/L (ref 135–145)

## 2013-06-19 NOTE — Patient Instructions (Signed)
We need to check lab today  Restart the lasix.  Stay on your other medicines  See me and Dr. Patty SermonsBrackbill on May 27th.  Call [facility] office at [phone] if you have any questions, problems or concerns.

## 2013-06-19 NOTE — Progress Notes (Signed)
[name] Date of Birth: [birth year] Medical Record #[id number]  History of Present Illness: [name] is seen back today for a post hospital/TOC visit. Seen for Dr. Mare Ferrari. He has an extensive history which includes ongoing smoking, CAD with prior CABG, ICM, systolic HF, underlying PPM, PVD, prior stroke, CKD, HTN, HLD and COPD. He has had tendency towards noncompliance.   Most recently presented to the hospital with chest pain and dyspnea. Cardiac markers slightly elevated for NSTEMI. EF worse by echo - down to 20 to 25%. Was cathed after gentle hydration. Had DES of the SVG to the DX and plan was to pursue staged intervention of the SVG to PDA and possibly medical therapy for the native LCX based on the extent of the diffuse moderate to severe disease - however, after his PCI, he had right facial droop with possible small infarct versus TIA. Carotids showed a 1 to 39% on the right and > 96% on the LICA. Seen by [facility] - needs left CEA but will have to be postponed for 6 to 8 weeks after his PCI with Dr. Bridgett Larsson. Also noted that we would need to address timing of possible staged intervention of the SVG to PD. Also to be considered for upgrade to ICD implant with Dr. Lovena Le.   Comes back today. Here with "his significant other". She gives the entire history. He does not talk very much. No chest pain. Breathing is improved. Swelling has improved. Back smoking. Salt use will be an issue. He is sleeping a lot. Says he is quite fatigued. Taking his medicines. His lasix has been held for 2 days due to his kidney function. For recheck today.   Current Outpatient Prescriptions  Medication Sig Dispense Refill  . amLODipine (NORVASC) 5 MG tablet Take 5 mg by mouth daily.       Marland Kitchen aspirin 81 MG tablet Take 81 mg by mouth daily.        Marland Kitchen atorvastatin (LIPITOR) 80 MG tablet Take 80 mg by mouth daily.      . carvedilol (COREG) 6.25 MG tablet Take 1 tablet (6.25 mg total) by mouth 2 (two) times daily with a  meal.  60 tablet  11  . clopidogrel (PLAVIX) 75 MG tablet Take 75 mg by mouth daily with breakfast.      . dexlansoprazole (DEXILANT) 60 MG capsule Take 60 mg by mouth daily.      Marland Kitchen ezetimibe (ZETIA) 10 MG tablet Take 10 mg by mouth daily.      Marland Kitchen losartan (COZAAR) 100 MG tablet Take 1 tablet (100 mg total) by mouth daily.  30 tablet  11  . meclizine (ANTIVERT) 25 MG tablet Take 25 mg by mouth 3 (three) times daily as needed for dizziness. Patient has been taking 1 tablet every day      . nitroGLYCERIN (NITROSTAT) 0.4 MG SL tablet Place 0.4 mg under the tongue every 5 (five) minutes as needed. For chest pain      . potassium chloride 20 MEQ TBCR Take 20 mEq by mouth 2 (two) times daily.  60 tablet  11  . spironolactone (ALDACTONE) 25 MG tablet Take 0.5 tablets (12.5 mg total) by mouth daily.  15 tablet  5  . furosemide (LASIX) 40 MG tablet Take 1 tablet (40 mg total) by mouth 2 (two) times daily.  60 tablet  11   No current facility-administered medications for this visit.    Allergies  Allergen Reactions  . Imdur [Isosorbide Mononitrate]  HEADACHES   . Metformin And Related     ABDOMINAL CRAMPS    Past Medical History  Diagnosis Date  . Hyperlipidemia   . Vitamin B12 deficiency   . Hypertensive heart disease 09/20/2008    Qualifier: Diagnosis of  By: Darrick Meigs    . CAD     a. s/p CABG;  b. NSTEMI 06/2013 - [facility]:  LIMA-LAD patent, SVG-D1 with 99% subtotal occlusion, SVG-OM occluded, proximal to mid circumflex 60-70%, terminal OM 70-80%, both OM branches grafted were occluded, proximal RCA occluded, SVG-right PDA with mid to distal 80-90% and more distal 70%, RPL1 proximal 60% => s/p DES to S-Dx - c/b TIA - staged intervention of S-RPDA placed on hold  . BRADYCARDIA     s/p pacer  . 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 TIA 09/20/2008    Qualifier: Diagnosis of  By: Darrick Meigs    . PSORIASIS 09/20/2008    Qualifier:  Diagnosis of  By: Darrick Meigs    . Intractable hiccoughs 08/03/2010  . Dyslipidemia 09/05/2011  . GERD (gastroesophageal reflux disease) 11/06/2011  . Nephrolithiasis 11/06/2011  . Carotid stenosis 11/06/2011    Bilat 60-80% 2010;  Carotid US (06/2013):  RICA 2-69%;  LICA > 48% -  needs L CEA in 6-8 weeks  . Chronic systolic heart failure   . DVT (deep venous thrombosis)   . Type II diabetes mellitus   . History of stomach ulcers   . Cerebellar stroke, acute ~ 2009    residual:  "hiccoughs" (06/09/2013)  . Mini stroke     "several/dr" (06/09/2013)  . Tobacco abuse   . PAD (peripheral artery disease)   . Ischemic cardiomyopathy     a. Echo (06/2013):  EF 20-25%, severe HK of anteroseptum, AK of anterolateral wall, some degree of AS, mild MR, mod LAE, mod TR    Past Surgical History  Procedure Laterality Date  . Insert / replace / remove pacemaker    . Femoral-femoral bypass graft  02/25/2012    Procedure: BYPASS GRAFT FEMORAL-FEMORAL ARTERY;  Surgeon: Conrad Webb, MD;  Location: [facility];  Service: Vascular;  Laterality: N/A;  RIGHT TO LEFT Using 70m x 30 cm Hemashield Graft  . Femoral-tibial bypass graft  02/25/2012    Procedure: BYPASS GRAFT FEMORAL-TIBIAL ARTERY;  Surgeon: BConrad [place] MD;  [place] [facility]  Service: Vascular;  Laterality: Left;  . Endarterectomy femoral  02/25/2012    Procedure: ENDARTERECTOMY FEMORAL;  Surgeon: BConrad [place] MD;  [place] [facility]  Service: Vascular;  Laterality: Bilateral;  . Patch angioplasty  02/25/2012    Procedure: PATCH ANGIOPLASTY;  Surgeon: BConrad Sarepta MD;  [facility];  Service: Vascular;  Laterality: Bilateral;  using  1 cm x 6 cm vascu guard patch angioplasty  . Intraoperative arteriogram  02/25/2012    Procedure: INTRA OPERATIVE ARTERIOGRAM;  Surgeon: BConrad  MD;  [facility]  Service: Vascular;  Laterality: Left;  . Coronary artery bypass graft      "CABG X5"  . Coronary angioplasty  09/18/2006    WELL PRESERVED LEFT  VENTRICULAR SYSTOLIC FUNCTION. THERE APPEARS TBE SOME HYPOKINESIS OF THE POSTERIOR WALL. EF 60%  . Coronary angioplasty with stent placement      "multiple"  . Kidney stone surgery      "cut them out"    History  Smoking status  . Current Every Day Smoker -- 0.50  packs/day for 57 years  . Types: Cigarettes  Smokeless tobacco  . Never Used    History  Alcohol Use  . Yes    Comment: 06/08/2013 "quit in 1994; drank on the weekends before that"    Family History  Problem Relation Age of Onset  . COPD Mother   . Diabetes Mother   . Heart disease Mother   . Hyperlipidemia Mother   . Hypertension Mother   . Heart attack Mother   . Coronary artery disease Father   . Heart disease Father     before age 2  . Hyperlipidemia Father   . Hypertension Father   . Heart attack Father     Review of Systems: The review of systems is per the HPI.  All other systems were reviewed and are negative.  Physical Exam: BP 120/70  Pulse 66  Ht 5' 6"  (1.676 m)  Wt 161 lb (73.029 kg)  BMI 26.00 kg/m2  SpO2 100% Patient is alert, has flat affect and in no acute distress. Skin is warm and dry. Color is normal.  HEENT is unremarkable. Normocephalic/atraumatic. PERRL. Sclera are nonicteric. Neck is supple. No masses. No JVD. Lungs are coarse. Cardiac exam shows an irregular rhythm. Abdomen is soft. Extremities are without edema. Gait and ROM are intact. No gross neurologic deficits noted.  LABORATORY DATA: EKG today shows sinus rhythm, diffuse T wave changes, LVH  Lab Results  Component Value Date   WBC 6.4 06/12/2013   HGB 12.2* 06/12/2013   HCT 36.8* 06/12/2013   PLT 253 06/12/2013   GLUCOSE 180* 06/17/2013   CHOL 122 06/10/2013   TRIG 89 06/10/2013   HDL 28* 06/10/2013   LDLDIRECT 155.0 09/11/2012   LDLCALC 76 06/10/2013   ALT 7 06/09/2013   AST 12 06/09/2013   NA 137 06/17/2013   K 3.7 06/17/2013   CL 99 06/17/2013   CREATININE 2.2* 06/17/2013   BUN 15 06/17/2013   CO2 33* 06/17/2013   TSH 4.720*  06/09/2013   PSA 1.84 09/11/2012   INR 1.16 06/09/2013   HGBA1C 7.7* 06/12/2013   PROCEDURES PERFORMED:  LEFT HEART CATHETERIZATION WITH NATIVE & GRAFT ANGIOGRAPHY  PERCUTANEOUS CORONARY INTERVENTION OF THE SVG-DIAGONAL WITH AN INTEGRITY RESOLUTE DES 3.5 MM X 15 MM -- 3.75 MM PROCEDURE:Consent: Risks of procedure as well as the alternatives and risks of each were explained to the (patient/caregiver). Consent for procedure obtained.  Consent for signed by MD and patient with RN witness -- placed on chart.  PROCEDURE: The patient was brought to the [facility] in the fasting state and prepped and draped in the usual sterile fashion for Left radial OR Right groin access. A modified Allen's test with plethysmography was performed, revealing excellent Ulnar artery collateral flow. Sterile technique was used including antiseptics, cap, gloves, gown, hand hygiene, mask and sheet. Skin prep: Chlorhexidine.  Time Out: Verified patient identification, verified procedure, site/side was marked, verified correct patient position, special equipment/implants available, medications/allergies/relevent history reviewed, required imaging and test results available. Performed  Access: LEFT RADIAL Artery; 6 Fr Sheath -- Seldinger technique (Angiocath Micropuncture Kit)  IA Radial Cocktail - 10 mL, IV Heparin 4000 Units Diagnostic Left Heart Catheterization with Coronary and Graft Angiography: 5 Fr JR 4, JL4, AL-1, AR-2 catheters advanced and exchanged over long exchange Safety J-wire into the ascending aorta and used for selective coronary artery engagement.  Right Coronary Artery Angiography: JR4  Left Coronary Artery Angiography: JL4  SVG-rPDA Angiography: JR4,  AL1, AR2  SVG-OM Angiography: AL-1  SVG-Diag Angiography: AR-2  LIMA-LAD Angiography: JR4  LV Hemodynamics (LV Gram): JR4 TR Band: 1212 Hours, 12 mL air  Hemodynamics:  Central Aortic / Mean Pressures: 144/86 mmHg; 106 mmHg   Left Ventricular Pressures / EDP: 144/17 mmHg; 35 mmHg Left Ventriculography: Not Done  EF: 20-25 % by Echocardiogram Coronary Anatomy:  Left Main: Large-caliber vessel that bifurcates into the LAD and Circumflex. Mild calcification but no significant lesions. LAD: Was likely a large vessel but is currently a moderate caliber vessel with diffuse proximal stenosis of roughly 40-50% prior to a large first septal perforator trunk. This occurs at the site of a 100% occluded mid LAD stent. The perforator trunk is a major branch that has a least smaller moderate caliber. The ostium has a 90% stenosis.  LIMA-LAD: Large-caliber widely patent graft to a moderate caliber distal LAD with diffuse mild to moderate luminal irregularities as it wraps the apex.  D1: Fills via a patent SVG  SVG-D1: Large-caliber vessel which feeds a small native diagonal branch just after the occluded LAD stent. There is a focal mid graft 99% subtotal occlusion followed by normal graft distally. Left Circumflex: Begin a moderate caliber vessel with several tortuous segments proximal and mid with several tandem 60-70% stenoses. There are short stop segments of the previously grafted OM's visualized. The AV groove circumflex bifurcates distally into a small posterior lateral branch in the last terminal OM. The terminal OM has a tubular 70-80% stenosis in a less than 2 mm vessel.  Both significant OM branches that were grafted or occluded  SVG-OM-OM: 100% flush occluded at the aorto-ostium RCA: Proximally occluded after a SA node branch  SVG-RPDA: Large-caliber graft with diffuse mild irregularities proximally. In the mid to distal segment there is a irregular hazy appearing 80-90% stenosis that has 2 sections all by a more distal 70% stenosis that is focal.  The graft fills a PDA which is moderate caliber with diffuse mild/moderate luminal irregularities. Retrograde filling fills a very short segment of the distal RCA before were seen  to the right posterior AV groove branch which made to the RPL 1. RPL 1 has a tubular 60% stenosis in the proximal segment; After reviewing the initial angiography, the culprit lesion was thought to be the 99% lesion in the SVG-diagonal however there also was significant lesions noted in the-RCA well the native and graft circumflex. Based on the patient's significant elevated LVEDP and low EF the decision was made to perform PCI of the most significant lesion in the vein graft to diagonal with and staged PCI of the remainder of the RCA. His medical therapy for the native circumflex based on the extent of diffuse moderate to severe disease. Preparation were made to proceed with PCI on the SVG-diagonal lesion.  Percutaneous Coronary Intervention:  Guide: 6 Fr AR-2 Guidewire: Pro-water  Based on the severe stenosis, the decision was made to use a small pre-dilation balloon to provide a channel large enough to allow passage of the Spider distal protection device  Predilation Balloon: Sprinter Legend 2.0 mm x 12 mm;  6 Atm x 30 Sec,  After initial balloon inflation, the balloon was removed in the spider wire was deployed in the distal vein graft.  Stent: Integrity Resolute DES 3.5 mm x 15 mm;  Deployed at 12 Atm x 0.5 Sec,  Post dilation with stent balloon: 16 Atm x 45 Sec Final Diameter: 3.75 mm  Post deployment angiography in multiple views, with and  without guidewire in place revealed excellent stent deployment and lesion coverage. There was no evidence of dissection or perforation. With improved flow down the diagonal there actually appeared to be some retrograde flow into the native LAD.  MEDICATIONS:  Anesthesia: Local Lidocaine 2 ml Sedation: None ;  Premedication: 5 mg Valium Isovue Contrast: 205 ml  Anticoagulation: IV Heparin 4000 Units ; Angiomax Bolus & drip  Anti-Platelet Agent: The patient is on a standing dose of Plavix and aspirin PATIENT DISPOSITION:  The patient was transferred to  the PACU holding area in a hemodynamicaly stable, chest pain free condition.  The patient tolerated the procedure well, and there were no complications. EBL: < 20 ml  The patient was stable before, during, and after the procedure. POST-OPERATIVE DIAGNOSIS:  Severe native CAD as described. D. on grafted native circumflex has diffuse moderate to severe stenosis throughout, most notably in the distal OM branch. The most likely culprit lesion is the severe lesion in the SVG diagonal -- treated with successful DES stent PCI as described  Severe tandem lesions in the mid-distal SVG-RPDA  Severely elevated LVEDP, with systemic hypertension PLAN OF CARE:  Transfer back to the TCU for standard post radial cath care.  Continue Angiomax infusion until the current bag is complete (roughly 1 hour)  Restart Heparin drip 6 hours post TR band removal  Initiate IV diuretics for severely elevated EDP suggestive of acute on chronic systolic and diastolic heart failure  After discussing with Dr. brachial, we decided to consider a staged PCI early next week on the SVG-RCA. This is to allow time for recovery of renal function and diuresis. Leonie Man, M.D., M.S.  [facility]  [address] [place], [place] [address]  [phone]  06/11/2013  12:11 PM   Echo Study Conclusions  - Left ventricle: The cavity size was normal. Systolic function was severely reduced. The estimated ejection fraction was in the range of 20% to 25%. Diffuse hypokinesis. There is severe Hypokinesis of the anteroseptal myocardium. There is akinesis of the entire anterolateral myocardium. - Aortic valve: no significant AV gradient was noted on doppler analysis but suspect that patient has some degree of AS with normal gradient due to low CO with severe LV dysfunction Severe diffuse thickening and calcification. - Mitral valve: Mild regurgitation. - Left atrium: The atrium was moderately  dilated. - Tricuspid valve: Moderate regurgitation.    Assessment / Plan: 1. Recent NSTEMI - with PCI with DES to the SVG to the DX - has residual disease - I have talked with Dr. Mare Ferrari here in the office today - he has opted for medical management of his remaining coronary disease. Looks like he is headed towards chronic renal failure based on his labs - this will cause difficulty in treating his heart failure. His symptoms of chest pain and SOB have improved with fixing the culprit vessel. Needs recheck of his labs today. Prognosis felt to be quite poor with very limited options at this time.   2. 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 Will see back around that time as well. He will be high risk for any type of intervention.   3. Ongoing tobacco abuse - he is not going to stop.  4. HTN - BP ok.  5. Systolic HF - looks compensated. Restart his Lasix. Salt use is  going to be an issue.   6. CKD - will need labs rechecked today.  His overall prognosis looks quite poor.  Patient is agreeable to this plan and will call if any problems develop in the interim.   Burtis Junes, RN, [facility] [address] [place],   [phone]

## 2013-06-21 NOTE — Progress Notes (Signed)
Quick Note:  Please report to patient. The recent labs are stable. Continue same medication and careful diet. Kidney function is improved ______ 

## 2013-07-08 ENCOUNTER — Telehealth: Payer: Self-pay | Admitting: Cardiology

## 2013-07-08 NOTE — Telephone Encounter (Signed)
No fasting necessary, advised Jacob DresserConnie of labs

## 2013-07-08 NOTE — Telephone Encounter (Signed)
Message copied by Burnell BlanksPRATT, Pj Zehner B on Wed Jul 08, 2013  4:26 PM ------      Message from: Cassell ClementBRACKBILL, THOMAS      Created: Sun Jun 21, 2013  1:24 PM       Please report to patient.  The recent labs are stable. Continue same medication and careful diet.  Kidney function is improved. ------

## 2013-07-08 NOTE — Telephone Encounter (Signed)
New Message  Pt requests a call back. Will he need to fast for labs for 07/09/2013 appt with Dr. Patty SermonsBrackbill?

## 2013-07-10 ENCOUNTER — Encounter: Payer: Self-pay | Admitting: Cardiology

## 2013-07-10 ENCOUNTER — Ambulatory Visit (INDEPENDENT_AMBULATORY_CARE_PROVIDER_SITE_OTHER): Payer: Medicare Other | Admitting: Cardiology

## 2013-07-10 VITALS — BP 140/74 | HR 94 | Ht 66.0 in | Wt 165.0 lb

## 2013-07-10 DIAGNOSIS — I119 Hypertensive heart disease without heart failure: Secondary | ICD-10-CM

## 2013-07-10 DIAGNOSIS — I5022 Chronic systolic (congestive) heart failure: Secondary | ICD-10-CM

## 2013-07-10 DIAGNOSIS — I2510 Atherosclerotic heart disease of native coronary artery without angina pectoris: Secondary | ICD-10-CM

## 2013-07-10 DIAGNOSIS — N183 Chronic kidney disease, stage 3 unspecified: Secondary | ICD-10-CM

## 2013-07-10 DIAGNOSIS — R0989 Other specified symptoms and signs involving the circulatory and respiratory systems: Secondary | ICD-10-CM

## 2013-07-10 DIAGNOSIS — I509 Heart failure, unspecified: Secondary | ICD-10-CM

## 2013-07-10 DIAGNOSIS — I5023 Acute on chronic systolic (congestive) heart failure: Secondary | ICD-10-CM

## 2013-07-10 DIAGNOSIS — I6529 Occlusion and stenosis of unspecified carotid artery: Secondary | ICD-10-CM

## 2013-07-10 NOTE — Assessment & Plan Note (Signed)
The patient is not having any severe dyspnea.  He has limited exercise tolerance however.  He is not oxygen dependent.  He continues to smoke a pack of cigarettes a day.  He is not having any orthopnea or paroxysmal nocturnal dyspnea or ankle edema.

## 2013-07-10 NOTE — Patient Instructions (Signed)
Your physician recommends that you continue on your current medications as directed. Please refer to the Current Medication list given to you today.  Your physician recommends that you schedule a follow-up appointment in: 2 month ov/ekg with Dawayne PatriciaLori G NP or  Dr. Patty SermonsBrackbill

## 2013-07-10 NOTE — Assessment & Plan Note (Signed)
Patient has known carotid artery disease.  He has an appointment for Dopplers on 07/24/13 following which he will be seen by Dr. Imogene Burnhen.  He denies any new TIA symptoms

## 2013-07-10 NOTE — Progress Notes (Signed)
[name] Date of Birth:  04/15/1945 [facility] [address] [place], [place]  [address] [phone]        Fax   [phone]   History of Present Illness: This pleasant 68 year old gentleman is seen for a scheduled followup office visit. He has a past history of ischemic heart disease. He underwent coronary artery bypass graft surgery on 05/21/08. He has had prior strokes. 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 The hiccups occur about once a week and last for several days. Even though it has been several years since his stroke, the hiccups do not appear to be getting any better. He's had long-standing high blood pressure. He has claudication of the legs and peripheral arterial occlusive disease and he continues to smoke cigarettes against advice. He has a history of sick sinus syndrome and has a functioning pacemaker.  She was hospitalized on 47/15 until 06/14/13 with worsening chest pain and shortness of breath.  He ruled in for a non-STEMI.  He underwent cardiac catheterization and underwent a drug-eluting stent to the saphenous vein graft to the diagonal.  Current Outpatient Prescriptions  Medication Sig Dispense Refill  . amLODipine (NORVASC) 5 MG tablet Take 5 mg by mouth daily.       Marland Kitchen. aspirin 81 MG tablet Take 81 mg by mouth daily.        Marland Kitchen. atorvastatin (LIPITOR) 80 MG tablet Take 80 mg by mouth daily.      . carvedilol (COREG) 6.25 MG tablet Take 1 tablet (6.25 mg total) by mouth 2 (two) times daily with a meal.  60 tablet  11  . clopidogrel (PLAVIX) 75 MG tablet Take 75 mg by mouth daily with breakfast.      . dexlansoprazole (DEXILANT) 60 MG capsule Take 60 mg by mouth daily.      Marland Kitchen. ezetimibe (ZETIA) 10 MG tablet Take 10 mg by mouth daily.      . furosemide (LASIX) 40 MG tablet Take 1 tablet (40 mg total) by mouth 2 (two) times daily.  60 tablet  11  . losartan (COZAAR) 100 MG  tablet Take 1 tablet (100 mg total) by mouth daily.  30 tablet  11  . meclizine (ANTIVERT) 25 MG tablet Take 25 mg by mouth 3 (three) times daily as needed for dizziness. Patient has been taking 1 tablet every day      . nitroGLYCERIN (NITROSTAT) 0.4 MG SL tablet Place 0.4 mg under the tongue every 5 (five) minutes as needed. For chest pain      . potassium chloride 20 MEQ TBCR Take 20 mEq by mouth 2 (two) times daily.  60 tablet  11  . spironolactone (ALDACTONE) 25 MG tablet Take 0.5 tablets (12.5 mg total) by mouth daily.  15 tablet  5   No current facility-administered medications for this visit.    Allergies  Allergen Reactions  . Imdur [Isosorbide Mononitrate]     HEADACHES   . Metformin And Related     ABDOMINAL CRAMPS    Patient Active Problem List   Diagnosis Date Noted  . Benign hypertensive heart disease without heart failure 08/03/2010    Priority: High  . Hx of CABG 08/03/2010    Priority: Medium  . Intractable hiccoughs 08/03/2010    Priority: Medium  . Cardiomyopathy, ischemic 06/14/2013  . COPD (chronic obstructive pulmonary disease) 06/14/2013  . Hypokalemia 06/14/2013  . Acute on  chronic systolic heart failure 06/13/2013  . NSTEMI (non-ST elevated myocardial infarction) 06/09/2013  . DOE (dyspnea on exertion) 05/14/2013  . Chronic systolic CHF (congestive heart failure) 11/20/2012  . PAD (peripheral artery disease) 11/20/2012  . Orthopnea 10/21/2012  . Hypersomnolence 09/11/2012  . Right arm pain 09/11/2012  . Dizziness and giddiness 05/23/2012  . Peripheral vascular disease, unspecified 03/14/2012  . Altered mental status 02/20/2012  . Hallucinations 02/20/2012  . Delirium 02/20/2012  . Chronic kidney disease, stage 3 02/19/2012  . Ischemic leg 02/18/2012  . Ischemic heart disease 01/08/2012  . Preventative health care 11/06/2011  . GERD (gastroesophageal reflux disease) 11/06/2011  . Nephrolithiasis 11/06/2011  . Carotid stenosis 11/06/2011  .  Dyslipidemia 09/05/2011  . Vascular claudication 11/15/2010  . Pacemaker 09/05/2010  . Tobacco abuse 08/03/2010  . Cardiac pacemaker in situ 10/08/2008  . DIABETES MELLITUS, TYPE II 09/20/2008  . HYPERTENSION 09/20/2008  . CAD 09/20/2008  . BRADYCARDIA 09/20/2008  . 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 TIA 09/20/2008  . PSORIASIS 09/20/2008    History  Smoking status  . Current Every Day Smoker -- 0.50 packs/day for 57 years  . Types: Cigarettes  Smokeless tobacco  . Never Used    History  Alcohol Use  . Yes    Comment: 06/08/2013 "quit in 1994; drank on the weekends before that"    Family History  Problem Relation Age of Onset  . COPD Mother   . Diabetes Mother   . Heart disease Mother   . Hyperlipidemia Mother   . Hypertension Mother   . Heart attack Mother   . Coronary artery disease Father   . Heart disease Father     before age 78  . Hyperlipidemia Father   . Hypertension Father   . Heart attack Father     Review of Systems: Constitutional: no fever chills diaphoresis or fatigue or change in weight.  Head and neck: no hearing loss, no epistaxis, no photophobia or visual disturbance. Respiratory: No cough, shortness of breath or wheezing. Cardiovascular: No chest pain peripheral edema, palpitations. Gastrointestinal: No abdominal distention, no abdominal pain, no change in bowel habits hematochezia or melena. Genitourinary: No dysuria, no frequency, no urgency, no nocturia. Musculoskeletal:No arthralgias, no back pain, no gait disturbance or myalgias. Neurological: No dizziness, no headaches, no numbness, no seizures, no syncope, no weakness, no tremors. Hematologic: No lymphadenopathy, no easy bruising. Psychiatric: No confusion, no hallucinations, no sleep disturbance.    Physical Exam: Filed Vitals:   07/10/13 0944  BP: 140/74  Pulse: 94   the general appearance reveals a well-developed well-nourished gentleman in no distress.The head and  neck exam reveals pupils equal and reactive.  Extraocular movements are full.  There is no scleral icterus.  The mouth and pharynx are normal.  The neck is supple.  The carotids reveal left carotid bruit The jugular venous pressure is normal.  The  thyroid is not enlarged.  There is no lymphadenopathy.  There is a pacemaker in the left upper chest. The chest is clear to percussion and auscultation.  There are no rales or rhonchi.  Expansion of the chest is symmetrical.  The precordium is quiet.  The first heart sound is normal.  The second heart sound is physiologically split.  There is no  gallop rub or click.  There is a grade 2/6 systolic murmur at the base. There is no abnormal lift or heave.  The abdomen is soft and nontender.  The bowel sounds are normal.  The  liver and spleen are not enlarged.  There are no abdominal masses.  There are no abdominal bruits.  Extremities reveal that the right posterior tibial pulses palpable.  The other pedal pulses are questionable.  There is no phlebitis or edema.  There is no cyanosis or clubbing.  Strength is normal and symmetrical in all extremities.  There is no lateralizing weakness.  There are no sensory deficits.  The skin is warm and dry.  There is no rash.     Assessment / Plan:  1. ischemic cardiomyopathy with recent ejection fraction by echo 20-25%. 2. carotid artery disease 3. status post CABG remotely.  Status post recent DES stent to vein graft to the diagonal. 4. chronic hiccups 5.  Chronic kidney disease stage III 6. ongoing tobacco abuse one pack a day  Plan: Continue current medication.  Recheck in 2 months for office visit and EKG.  No plans for further PCI unless he begins to be symptomatic from angina pectoris again.

## 2013-07-10 NOTE — Assessment & Plan Note (Signed)
Blood pressure has been stable on current regimen

## 2013-07-23 ENCOUNTER — Encounter: Payer: Self-pay | Admitting: Vascular Surgery

## 2013-07-24 ENCOUNTER — Ambulatory Visit (INDEPENDENT_AMBULATORY_CARE_PROVIDER_SITE_OTHER): Payer: Medicare Other | Admitting: Vascular Surgery

## 2013-07-24 ENCOUNTER — Other Ambulatory Visit: Payer: Self-pay

## 2013-07-24 ENCOUNTER — Ambulatory Visit (HOSPITAL_COMMUNITY)
Admission: RE | Admit: 2013-07-24 | Discharge: 2013-07-24 | Disposition: A | Discharge status: Home / Self Care | Payer: Medicare Other | Source: Ambulatory Visit | Attending: Vascular Surgery | Admitting: Vascular Surgery

## 2013-07-24 ENCOUNTER — Encounter: Payer: Self-pay | Admitting: Vascular Surgery

## 2013-07-24 VITALS — BP 121/87 | HR 86 | Ht 64.0 in | Wt 166.5 lb

## 2013-07-24 DIAGNOSIS — I6529 Occlusion and stenosis of unspecified carotid artery: Secondary | ICD-10-CM | POA: Insufficient documentation

## 2013-07-24 DIAGNOSIS — I70229 Atherosclerosis of native arteries of extremities with rest pain, unspecified extremity: Secondary | ICD-10-CM | POA: Insufficient documentation

## 2013-07-24 DIAGNOSIS — I63239 Cerebral infarction due to unspecified occlusion or stenosis of unspecified carotid arteries: Secondary | ICD-10-CM

## 2013-07-24 DIAGNOSIS — Z01810 Encounter for preprocedural cardiovascular examination: Secondary | ICD-10-CM

## 2013-07-24 NOTE — Progress Notes (Signed)
Established Carotid Patient  History of Present Illness  [name] is a 68 y.o. ([birth year]) male who presents with chief complaint: recent strokes x 2. Previous carotid studies demonstrated: RICA <80% stenosis, LICA >80% stenosis.  Patient has known recent history of TIA: expressive aphasia and facial droop and lef eye blurring.  The patient's previous neurologic deficits have resolved.   These strokes developed in the setting of PCI resulting in placement of a DES.  The plan was a staged intervention on his heart, which at this point has been revised to medical mgmt per Dr. Patty Sermons.  This patient is currently being risk stratified as high risk by Cardiology.  Additionally, this patient had on 02/15/12: 1. Bilateral iliofemoral endarterectomies with bovine patch angioplasty 2. Right to left femorofemoral bypass 3. Composite conduit creation with external reinforced Propaten/Right non-reversed greater saphenous vein 4. Left common femoral artery to posterior tibial artery bypass with composite conduit  5. Right common femoral artery open cannulation 6. Left leg runoff  This patient did not follow up as instructed post-operatively and has been lost to follow up.  Pt still complains of some intermittent claudication.    The patient's PMH, PSH, SH, FamHx, Med, and Allergies are unchanged from 06/13/13.  On ROS today: no active chest pain, DOE, no rest pain, no ulcers or gangrene  Physical Examination  Filed Vitals:   07/24/13 1208 07/24/13 1210  BP: 131/82 121/87  Pulse: 86   Height: 5\' 4"  (1.626 m)   Weight: 166 lb 8 oz (75.524 kg)   SpO2: 100%    Body mass index is 28.57 kg/(m^2).  General: A&O x 3, WDWN  Eyes: PERRLA, EOMI  Neck: Supple, no nuchal rigidity, no palpable LAD  Pulmonary: Sym exp, good air movt, CTAB, no rales, rhonchi, & wheezing  Cardiac: RRR, Nl S1, S2, no Murmurs, rubs or gallops  Vascular: Vessel Right Left  Radial Palpable Palpable    Brachial Palpable Palpable  Carotid Palpable, without bruit Palpable, without bruit  Aorta Not palpable N/A  Femoral Palpable Palpable  Popliteal Not palpable Not palpable  PT Faintly Palpable Faintly Palpable  DP Not Palpable Not Palpable   Gastrointestinal: soft, NTND, -G/R, - HSM, - masses, - CVAT B  Musculoskeletal: M/S 5/5 throughout , Extremities without ischemic changes   Neurologic: CN 2-12 intact , Pain and light touch intact in extremities , Motor exam as listed above  Non-Invasive Vascular Imaging  CAROTID DUPLEX (Date: 07/24/2013 ):   R ICA stenosis: 40-59%  R VA:  patent and antegrade  L ICA stenosis: 80-99%  L VA:  patent and antegrade  Medical Decision Making  [name] is a 68 y.o. male who presents with: sx L ICA stenosis >80%, known CAD s/p recent PCI w/ DES, prior R to L fem-fem BPG w/ L CFA to PT bypass with composite conduit for rest pain   Given his high cardiac risk stratification, he needs evaluation for possible L CAS placement, but interrogation of his anatomy is necessary to determine if that can be offered.  Based on the patient's vascular studies and examination, I have offered the patient: B carotid and cerebral angiogram. I discussed with the patient the nature of angiographic procedures, especially the limited patencies of any endovascular intervention.  The patient is aware of that the risks of an angiographic procedure include but are not limited to: bleeding, infection, access site complications, renal failure, embolization, rupture of vessel, dissection, possible need for emergent surgical intervention,  possible need for surgical procedures to treat the patient's pathology, anaphylactic reaction to contrast, and stroke and death.   The patient is aware of the risks and agrees to proceed.  This is scheduled for the 28 MAY 15.  I discussed in depth with the patient the nature of atherosclerosis, and emphasized the importance of maximal  medical management including strict control of blood pressure, blood glucose, and lipid levels, antiplatelet agents, obtaining regular exercise, and cessation of smoking.    The patient is aware that without maximal medical management the underlying atherosclerotic disease process will progress, limiting the benefit of any interventions. The patient is currently on a statin: Lipitor. The patient is currently on an anti-platelet: Plavix. Additionally once his L ICA stenosis is dealt with he need to return to the office for surveillance on his fem-fem BPG and L fem-PT BPG.  Thank you for allowing us to participate in this patient's care.  Leonides SakeBrian Carlyn Mullenbach, MD [facility] [place] Office: [phone] Pager: [phone]  07/24/2013, 1:00 PM

## 2013-07-28 ENCOUNTER — Encounter (HOSPITAL_COMMUNITY): Payer: Self-pay | Admitting: Pharmacy Technician

## 2013-07-29 ENCOUNTER — Ambulatory Visit (INDEPENDENT_AMBULATORY_CARE_PROVIDER_SITE_OTHER): Payer: Medicare Other | Admitting: Nurse Practitioner

## 2013-07-29 ENCOUNTER — Encounter: Payer: Self-pay | Admitting: Nurse Practitioner

## 2013-07-29 VITALS — BP 110/60 | HR 93 | Ht 66.0 in | Wt 166.8 lb

## 2013-07-29 DIAGNOSIS — I2589 Other forms of chronic ischemic heart disease: Secondary | ICD-10-CM

## 2013-07-29 DIAGNOSIS — I259 Chronic ischemic heart disease, unspecified: Secondary | ICD-10-CM

## 2013-07-29 DIAGNOSIS — I2510 Atherosclerotic heart disease of native coronary artery without angina pectoris: Secondary | ICD-10-CM

## 2013-07-29 DIAGNOSIS — I255 Ischemic cardiomyopathy: Secondary | ICD-10-CM

## 2013-07-29 DIAGNOSIS — Z720 Tobacco use: Secondary | ICD-10-CM

## 2013-07-29 DIAGNOSIS — F17200 Nicotine dependence, unspecified, uncomplicated: Secondary | ICD-10-CM

## 2013-07-29 MED ORDER — SODIUM CHLORIDE 0.9 % IV SOLN
INTRAVENOUS | Status: DC
  Administered 2013-07-30: 11:00:00 via INTRAVENOUS

## 2013-07-29 NOTE — Progress Notes (Signed)
[name] Date of Birth: 1945/12/10 Medical Record #[id number]  History of Present Illness: Mr. Angell is seen back today for a one month check. Seen for Dr. Patty Sermons.  He has an extensive history which includes ongoing smoking, CAD with prior CABG, ICM, systolic HF, underlying PPM, PVD, prior stroke, CKD, HTN, HLD and COPD. He has had tendency towards noncompliance.   Most recently presented to the hospital with chest pain and dyspnea. Cardiac markers slightly elevated for NSTEMI. EF worse by echo - down to 20 to 25%. Was cathed after gentle hydration. Had DES of the SVG to the DX and plan was to pursue staged intervention of the SVG to PDA and possibly medical therapy for the native LCX based on the extent of the diffuse moderate to severe disease - however, after his PCI, he had right facial droop with possible small infarct versus TIA. Carotids showed a 1 to 39% on the right and > 40% on the LICA. Seen by [facility] - needs left CEA but will have to be postponed for 6 to 8 weeks after this most recent PCI. Also noted that we would need to address timing of possible staged intervention of the SVG to PD however after discussion with Dr. Patty Sermons - he has opted for medical management.  Also to be considered for upgrade to ICD implant with Dr. Ladona Ridgel.   I saw him back in April. Seemed to be doing ok.  Dr. Patty Sermons saw him earlier this month.  Again noted that we would consider PCI to the SVG to PD if he develops recurrent angina.   Comes back today. Here with "his significant other". She continues to give the entire history. She says he is pretty miserable. Still with visual problems in the left eye. Has chest pain. Remains short of breath. Still smokes. Tired and fatigued. Understands that his options are quite limited. For angiogram with Dr. Imogene Burn tomorrow. Did not take his aspirin or Plavix today (but was planning on taking tomorrow). He says he knows that "this is just how he will be" and  really do not have too much higher expectation but the significant other would like "anything done that is possible".   Current Outpatient Prescriptions  Medication Sig Dispense Refill  . amLODipine (NORVASC) 5 MG tablet Take 5 mg by mouth daily.       Marland Kitchen aspirin 81 MG tablet Take 81 mg by mouth daily.        Marland Kitchen atorvastatin (LIPITOR) 80 MG tablet Take 80 mg by mouth daily.      . carvedilol (COREG) 6.25 MG tablet Take 1 tablet (6.25 mg total) by mouth 2 (two) times daily with a meal.  60 tablet  11  . clopidogrel (PLAVIX) 75 MG tablet Take 75 mg by mouth daily with breakfast.      . dexlansoprazole (DEXILANT) 60 MG capsule Take 60 mg by mouth daily.      Marland Kitchen ezetimibe (ZETIA) 10 MG tablet Take 10 mg by mouth daily.      . furosemide (LASIX) 40 MG tablet Take 1 tablet (40 mg total) by mouth 2 (two) times daily.  60 tablet  11  . KLOR-CON M20 20 MEQ tablet Take 20 mEq by mouth daily.       Marland Kitchen losartan (COZAAR) 100 MG tablet Take 1 tablet (100 mg total) by mouth daily.  30 tablet  11  . meclizine (ANTIVERT) 25 MG tablet Take 25 mg by mouth 3 (three) times daily as  needed for dizziness.       . nitroGLYCERIN (NITROSTAT) 0.4 MG SL tablet Place 0.4 mg under the tongue every 5 (five) minutes as needed. For chest pain      . spironolactone (ALDACTONE) 25 MG tablet Take 0.5 tablets (12.5 mg total) by mouth daily.  15 tablet  5   No current facility-administered medications for this visit.   Facility-Administered Medications Ordered in Other Visits  Medication Dose Route Frequency Provider Last Rate Last Dose  . 0.9 %  sodium chloride infusion   Intravenous Continuous Fransisco Hertz, MD        Allergies  Allergen Reactions  . Imdur [Isosorbide Mononitrate]     HEADACHES   . Metformin And Related     ABDOMINAL CRAMPS    Past Medical History  Diagnosis Date  . Hyperlipidemia   . Vitamin B12 deficiency   . Hypertensive heart disease 09/20/2008    Qualifier: Diagnosis of  By: Flonnie Overman    .  CAD     a. s/p CABG;  b. NSTEMI 06/2013 - [facility]:  LIMA-LAD patent, SVG-D1 with 99% subtotal occlusion, SVG-OM occluded, proximal to mid circumflex 60-70%, terminal OM 70-80%, both OM branches grafted were occluded, proximal RCA occluded, SVG-right PDA with mid to distal 80-90% and more distal 70%, RPL1 proximal 60% => s/p DES to S-Dx - c/b TIA - staged intervention of S-RPDA placed on hold  . BRADYCARDIA     s/p pacer  . 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 TIA 09/20/2008    Qualifier: Diagnosis of  By: Flonnie Overman    . PSORIASIS 09/20/2008    Qualifier: Diagnosis of  By: Flonnie Overman    . Intractable hiccoughs 08/03/2010  . Dyslipidemia 09/05/2011  . GERD (gastroesophageal reflux disease) 11/06/2011  . Nephrolithiasis 11/06/2011  . Carotid stenosis 11/06/2011    Bilat 60-80% 2010;  Carotid US (06/2013):  RICA 1-39%;  LICA > 80% -  needs L CEA in 6-8 weeks  . Chronic systolic heart failure   . DVT (deep venous thrombosis)   . Type II diabetes mellitus   . History of stomach ulcers   . Cerebellar stroke, acute ~ 2009    residual:  "hiccoughs" (06/09/2013)  . Mini stroke     "several/dr" (06/09/2013)  . Tobacco abuse   . PAD (peripheral artery disease)   . Ischemic cardiomyopathy     a. Echo (06/2013):  EF 20-25%, severe HK of anteroseptum, AK of anterolateral wall, some degree of AS, mild MR, mod LAE, mod TR    Past Surgical History  Procedure Laterality Date  . Insert / replace / remove pacemaker    . Femoral-femoral bypass graft  02/25/2012    Procedure: BYPASS GRAFT FEMORAL-FEMORAL ARTERY;  Surgeon: Fransisco Hertz, MD;  Location: [facility] OR;  Service: Vascular;  Laterality: N/A;  RIGHT TO LEFT Using 8mm x 30 cm Hemashield Graft  . Femoral-tibial bypass graft  02/25/2012    Procedure: BYPASS GRAFT FEMORAL-TIBIAL ARTERY;  Surgeon: Fransisco Hertz, MD;  Location: [facility] OR;  Service: Vascular;  Laterality: Left;  . Endarterectomy femoral  02/25/2012     Procedure: ENDARTERECTOMY FEMORAL;  Surgeon: Fransisco Hertz, MD;  Location: [facility] OR;  Service: Vascular;  Laterality: Bilateral;  . Patch angioplasty  02/25/2012    Procedure: PATCH ANGIOPLASTY;  Surgeon: Fransisco Hertz, MD;  Location[facility] OR;  Service: Vascular;  Laterality: Bilateral;  using  1 cm x 6 cm vascu guard patch angioplasty  . Intraoperative arteriogram  02/25/2012    Procedure: INTRA OPERATIVE ARTERIOGRAM;  Surgeon: Fransisco Hertz, MD;  Location: [facility] OR;  Service: Vascular;  Laterality: Left;  . Coronary artery bypass graft      "CABG X5"  . Coronary angioplasty  09/18/2006    WELL PRESERVED LEFT VENTRICULAR SYSTOLIC FUNCTION. THERE APPEARS TBE SOME HYPOKINESIS OF THE POSTERIOR WALL. EF 60%  . Coronary angioplasty with stent placement      "multiple"  . Kidney stone surgery      "cut them out"    History  Smoking status  . Current Every Day Smoker -- 0.50 packs/day for 57 years  . Types: Cigarettes  Smokeless tobacco  . Never Used    History  Alcohol Use  . Yes    Comment: 06/08/2013 "quit in 1994; drank on the weekends before that"    Family History  Problem Relation Age of Onset  . COPD Mother   . Diabetes Mother   . Heart disease Mother   . Hyperlipidemia Mother   . Hypertension Mother   . Heart attack Mother   . Coronary artery disease Father   . Heart disease Father     before age 78  . Hyperlipidemia Father   . Hypertension Father   . Heart attack Father     Review of Systems: The review of systems is per the HPI.  All other systems were reviewed and are negative.  Physical Exam: BP 110/60  Pulse 93  Ht 5\' 6"  (1.676 m)  Wt 166 lb 12.8 oz (75.66 kg)  BMI 26.94 kg/m2  SpO2 99% Patient is alert and in no acute distress. Flat affect. Skin is warm and dry. Color is normal.  HEENT is unremarkable. Normocephalic/atraumatic. PERRL. Sclera are nonicteric. Neck is supple. No masses. No JVD. Lungs are coarse. Cardiac exam shows a regular rate and rhythm. Abdomen is  soft. Extremities are without edema. Gait and ROM are intact. No gross neurologic deficits noted.  LABORATORY DATA:  Lab Results  Component Value Date   WBC 6.4 06/12/2013   HGB 12.2* 06/12/2013   HCT 36.8* 06/12/2013   PLT 253 06/12/2013   GLUCOSE 186* 06/19/2013   CHOL 122 06/10/2013   TRIG 89 06/10/2013   HDL 28* 06/10/2013   LDLDIRECT 155.0 09/11/2012   LDLCALC 76 06/10/2013   ALT 7 06/09/2013   AST 12 06/09/2013   NA 137 06/19/2013   K 3.8 06/19/2013   CL 101 06/19/2013   CREATININE 1.8* 06/19/2013   BUN 15 06/19/2013   CO2 28 06/19/2013   TSH 4.720* 06/09/2013   PSA 1.84 09/11/2012   INR 1.16 06/09/2013   HGBA1C 7.7* 06/12/2013   Echo Study Conclusions from April 2015  - Left ventricle: The cavity size was normal. Systolic function was severely reduced. The estimated ejection fraction was in the range of 20% to 25%. Diffuse hypokinesis. There is severe Hypokinesis of the anteroseptal myocardium. There is akinesis of the entire anterolateral myocardium. - Aortic valve: no significant AV gradient was noted on doppler analysis but suspect that patient has some degree of AS with normal gradient due to low CO with severe LV dysfunction Severe diffuse thickening and calcification. - Mitral valve: Mild regurgitation. - Left atrium: The atrium was moderately dilated. - Tricuspid valve: Moderate regurgitation.   Assessment / Plan: 1. Recent NSTEMI - with PCI with DES to the SVG to the DX - has residual disease -  I have already talked with Dr. Patty SermonsBrackbill back in April - Dr. Patty SermonsBrackbill has opted for medical management of his remaining coronary disease. Looked like he was headed towards chronic renal failure based on his labs - this will cause difficulty in treating his heart failure.  Prognosis felt to be quite poor with very limited options at this time.   2. 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 He will be high risk for  any type of intervention. He is for angiogram tomorrow. I have talked with "Joyce GrossKay" at [facility] - made them aware that he has not taken his aspirin or Plavix today - I told him to take Joyce Gross- Kay with Dr. Imogene Burnhen notes that the patient said he was not on Plavix.   3. Ongoing tobacco abuse - he is not going to stop.   4. HTN - BP ok.   5. Systolic HF  6. CKD  His overall prognosis looks quite poor. He remains on DAPT. Sees Dr. Ladona Ridgelaylor for consideration of upgrading his device to CRT. Sees Dr. Patty SermonsBrackbill in July. May need to consider palliative care services.   Patient is agreeable to this plan and will call if any problems develop in the interim.   Rosalio MacadamiaLori C. Kierah Goatley, RN, ANP-C  [facility]  [address]  [place], [place] [address]  [phone]

## 2013-07-29 NOTE — Patient Instructions (Signed)
Stay on your current medicines  See Dr. Ladona Ridgel as planned  See Dr. Patty Sermons back as planned  Call the [facility] office at [phone] if you have any questions, problems or concerns.

## 2013-07-30 ENCOUNTER — Telehealth: Payer: Self-pay | Admitting: Vascular Surgery

## 2013-07-30 ENCOUNTER — Encounter (HOSPITAL_COMMUNITY)
Admission: RE | Disposition: A | Discharge status: Home / Self Care | Payer: Self-pay | Source: Ambulatory Visit | Attending: Vascular Surgery

## 2013-07-30 ENCOUNTER — Ambulatory Visit (HOSPITAL_COMMUNITY)
Admission: RE | Admit: 2013-07-30 | Discharge: 2013-07-30 | Disposition: A | Discharge status: Home / Self Care | Payer: Medicare Other | Source: Ambulatory Visit | Attending: Vascular Surgery | Admitting: Vascular Surgery

## 2013-07-30 DIAGNOSIS — I6529 Occlusion and stenosis of unspecified carotid artery: Secondary | ICD-10-CM

## 2013-07-30 DIAGNOSIS — I2510 Atherosclerotic heart disease of native coronary artery without angina pectoris: Secondary | ICD-10-CM | POA: Insufficient documentation

## 2013-07-30 DIAGNOSIS — Z8673 Personal history of transient ischemic attack (TIA), and cerebral infarction without residual deficits: Secondary | ICD-10-CM | POA: Insufficient documentation

## 2013-07-30 DIAGNOSIS — Z9861 Coronary angioplasty status: Secondary | ICD-10-CM | POA: Insufficient documentation

## 2013-07-30 DIAGNOSIS — I509 Heart failure, unspecified: Secondary | ICD-10-CM | POA: Insufficient documentation

## 2013-07-30 HISTORY — PX: ARCH AORTOGRAM: SHX5501

## 2013-07-30 HISTORY — PX: CAROTID ANGIOGRAM: SHX5504

## 2013-07-30 LAB — POCT I-STAT, CHEM 8
BUN: 18 mg/dL (ref 6–23)
Calcium, Ion: 1.2 mmol/L (ref 1.13–1.30)
Chloride: 100 mEq/L (ref 96–112)
Creatinine, Ser: 2 mg/dL — ABNORMAL HIGH (ref 0.50–1.35)
Glucose, Bld: 111 mg/dL — ABNORMAL HIGH (ref 70–99)
HEMATOCRIT: 40 % (ref 39.0–52.0)
Hemoglobin: 13.6 g/dL (ref 13.0–17.0)
POTASSIUM: 3.9 meq/L (ref 3.7–5.3)
Sodium: 141 mEq/L (ref 137–147)
TCO2: 25 mmol/L (ref 0–100)

## 2013-07-30 SURGERY — CAROTID ANGIOGRAM
Anesthesia: LOCAL | Laterality: Left

## 2013-07-30 MED ORDER — HEPARIN (PORCINE) IN NACL 2-0.9 UNIT/ML-% IJ SOLN
INTRAMUSCULAR | Status: AC
  Filled 2013-07-30: qty 1000

## 2013-07-30 MED ORDER — ACETAMINOPHEN 325 MG PO TABS: 650.0000 mg | ORAL_TABLET | ORAL | Status: DC | PRN

## 2013-07-30 MED ORDER — LIDOCAINE HCL (PF) 1 % IJ SOLN
INTRAMUSCULAR | Status: AC
  Filled 2013-07-30: qty 30

## 2013-07-30 MED ORDER — SODIUM CHLORIDE 0.9 % IV SOLN: 1.0000 mL/kg/h | INTRAVENOUS | Status: DC

## 2013-07-30 NOTE — Telephone Encounter (Addendum)
Message copied by Fredrich Birks on Thu Jul 30, 2013  2:48 PM ------      Message from: Lorin Mercy K      Created: Thu Jul 30, 2013  1:27 PM      Regarding: Schedule                   ----- Message -----         From: Fransisco Hertz, MD         Sent: 07/30/2013   1:07 PM           To: [facility] [address]            [name]      [id number]      1946-02-18            PROCEDURE:      1.  Right common femoral artery cannulation under ultrasound guidance      2.  Placement of catheter in aorta      3.  Arch Aortogram      4.  Left carotid and cerebral angiogram            Follow-up with Dr. Darrick Penna in 1-2 weeks ------  07/30/13: lm for pt re appt, dpm

## 2013-07-30 NOTE — Discharge Instructions (Signed)
Arteriogram °Care After °These instructions give you information on caring for yourself after your procedure. Your doctor may also give you more specific instructions. Call your doctor if you have any problems or questions after your procedure. °HOME CARE °· Stay in bed the rest of the day. °· Keep your leg straight for at least 6 hours. °· Do not lift anything heavier than 10 pounds (about a gallon of milk) for 2 days. °· Do not walk a lot, run, or drive for 2 days. °· Return to normal activities in 2 days or as told by your doctor. °Finding out the results of your test °Ask when your test results will be ready. Make sure you get your test results. °GET HELP RIGHT AWAY IF:  °· You have fever of 102° F (38.9° C) or higher. °· You have more pain in your leg. °· The leg that was cut is: °· Bleeding. °· Puffy (swollen) or red. °· Cold. °· Pale or changes color. °· Weak. °· Tingly or numb. °If you go to the Emergency Room, tell your nurse that you have had an arteriogram. Take this paper with you to show the nurse. °MAKE SURE YOU: °· Understand these instructions. °· Will watch your condition. °· Will get help right away if you are not doing well or get worse. °Document Released: 05/18/2008 Document Revised: 05/14/2011 Document Reviewed: 05/18/2008 °ExitCare® Patient Information ©2014 [employer]. ° °

## 2013-07-30 NOTE — H&P (View-Only) (Signed)
Established [name]  History of Present Illness  [name] is a 68 y.o. ([birth year]) male who presents with chief complaint: recent strokes x 2. Previous Jacob studies demonstrated: RICA <80% stenosis, LICA >80% stenosis.  Snyder has known recent history of TIA: expressive aphasia and facial droop and lef eye blurring.  The Snyder's previous neurologic deficits have resolved.   These strokes developed in the setting of PCI resulting in placement of a DES.  The plan was a staged intervention on his heart, which at this point has been revised to medical mgmt per Dr. Patty Sermons.  This Snyder is currently being risk stratified as high risk by Cardiology.  Additionally, this Snyder had on 02/15/12: 1. Bilateral iliofemoral endarterectomies with bovine patch angioplasty 2. Right to left femorofemoral bypass 3. Composite conduit creation with external reinforced Propaten/Right non-reversed greater saphenous vein 4. Left common femoral artery to posterior tibial artery bypass with composite conduit  5. Right common femoral artery open cannulation 6. Left leg runoff  This Snyder did not follow up as instructed post-operatively and has been lost to follow up.  Pt still complains of some intermittent claudication.    The Snyder's PMH, PSH, SH, FamHx, Med, and Allergies are unchanged from 06/13/13.  On ROS today: no active chest pain, DOE, no rest pain, no ulcers or gangrene  Physical Examination  Filed Vitals:   07/24/13 1208 07/24/13 1210  BP: 131/82 121/87  Pulse: 86   Height: 5\' 4"  (1.626 m)   Weight: 166 lb 8 oz (75.524 kg)   SpO2: 100%    Body mass index is 28.57 kg/(m^2).  General: A&O x 3, WDWN  Eyes: PERRLA, EOMI  Neck: Supple, no nuchal rigidity, no palpable LAD  Pulmonary: Sym exp, good air movt, CTAB, no rales, rhonchi, & wheezing  Cardiac: RRR, Nl S1, S2, no Murmurs, rubs or gallops  Vascular: Vessel Right Left  Radial Palpable Palpable    Brachial Palpable Palpable  Jacob Palpable, without bruit Palpable, without bruit  Aorta Not palpable N/A  Femoral Palpable Palpable  Popliteal Not palpable Not palpable  PT Faintly Palpable Faintly Palpable  DP Not Palpable Not Palpable   Gastrointestinal: soft, NTND, -G/R, - HSM, - masses, - CVAT B  Musculoskeletal: M/S 5/5 throughout , Extremities without ischemic changes   Neurologic: CN 2-12 intact , Pain and light touch intact in extremities , Motor exam as listed above  Non-Invasive Vascular Imaging  Jacob DUPLEX (Date: 07/24/2013 ):   R ICA stenosis: 40-59%  R VA:  patent and antegrade  L ICA stenosis: 80-99%  L VA:  patent and antegrade  Medical Decision Making  [name] is a 68 y.o. male who presents with: sx L ICA stenosis >80%, known CAD s/p recent PCI w/ DES, prior R to L fem-fem BPG w/ L CFA to PT bypass with composite conduit for rest pain   Given his high cardiac risk stratification, he needs evaluation for possible L CAS placement, but interrogation of his anatomy is necessary to determine if that can be offered.  Based on the Snyder's vascular studies and examination, I have offered the Snyder: B Jacob and cerebral angiogram. I discussed with the Snyder the nature of angiographic procedures, especially the limited patencies of any endovascular intervention.  The Snyder is aware of that the risks of an angiographic procedure include but are not limited to: bleeding, infection, access site complications, renal failure, embolization, rupture of vessel, dissection, possible need for emergent surgical intervention,  possible need for surgical procedures to treat the Snyder's pathology, anaphylactic reaction to contrast, and stroke and death.   The Snyder is aware of the risks and agrees to proceed.  This is scheduled for the 28 MAY 15.  I discussed in depth with the Snyder the nature of atherosclerosis, and emphasized the importance of maximal  medical management including strict control of blood pressure, blood glucose, and lipid levels, antiplatelet agents, obtaining regular exercise, and cessation of smoking.    The Snyder is aware that without maximal medical management the underlying atherosclerotic disease process will progress, limiting the benefit of any interventions. The Snyder is currently on a statin: Lipitor. The Snyder is currently on an anti-platelet: Plavix. Additionally once his L ICA stenosis is dealt with he need to return to the office for surveillance on his fem-fem BPG and L fem-PT BPG.  Thank you for allowing us to participate in this Snyder's care.  Marelyn Rouser, MD [facility] [place] Office: [phone] Pager: [phone]  07/24/2013, 1:00 PM   

## 2013-07-30 NOTE — Interval H&P Note (Signed)
[facility] [place]  History and Physical Update  The patient was interviewed and re-examined.  The patient's previous History and Physical has been reviewed and is unchanged from my consult.  There is no change in the plan of care: B carotid and cerebral angiogram.  Leonides Sake, MD [facility]: [phone] Pager: [phone]  07/30/2013, 10:57 AM

## 2013-07-30 NOTE — Op Note (Signed)
OPERATIVE NOTE   PROCEDURE: 1.  Right common femoral artery cannulation under ultrasound guidance 2.  Placement of catheter in aorta 3.  Arch Aortogram 4.  Left carotid and cerebral angiogram  PRE-OPERATIVE DIAGNOSIS: Left internal carotid artery stenosis >80%, high cardiac risk stratification  POST-OPERATIVE DIAGNOSIS: same as above   SURGEON: Leonides SakeBrian Shuan Statzer, MD  ANESTHESIA: conscious sedation  ESTIMATED BLOOD LOSS: 30 cc  CONTRAST: 55 cc  FINDING(S): Type II aortic arch Patent innominate, left common carotid artery and left subclavian artery  Patent right subclavian artery and common carotid artery  Right vertebral dwindles after take off Patent left vertebral artery off subclavian artery (appears to be dominant vertebral) Patent left internal carotid artery with 90% stenosis distal: ~2 cm length with irregular lumen Patent left external carotid artery with ~50% stenosis Intracranial findings to be read by Neuroradiology  SPECIMEN(S):  none  INDICATIONS:   [name] is a 68 y.o. male who presents with symptomatic left internal carotid artery stenosis >80% with known significant CAD with heart failure.  The patient presents for: arch aortogram and left carotid angiogram to determine if he is a candidate for carotid stenting.  I discussed with the patient the nature of angiographic procedures, especially the limited patencies of any endovascular intervention.  The patient is aware of that the risks of an angiographic procedure include but are not limited to: bleeding, infection, access site complications, renal failure, embolization, rupture of vessel, dissection, possible need for emergent surgical intervention, possible need for surgical procedures to treat the patient's pathology, and stroke and death.  The patient is aware of the risks and agrees to proceed.  DESCRIPTION: After full informed consent was obtained from the patient, the patient was brought back to the  angiography suite.  The patient was placed supine upon the angiography table and connected to monitoring equipment.  The patient was then given conscious sedation, the amounts of which are documented in the patient's chart.  The patient was prepped and drape in the standard fashion for an angiographic procedure.  At this point, attention was turned to the right groin.  Under ultrasound guidance, the right common femoral artery was cannulated proximal to his femorofemoral graftwith a micropuncture needle.  The microwire was advanced into the iliac arterial system.  The needle was exchanged for a microsheath, which was loaded into the common femoral artery over the wire.  The microwire was exchanged for a [facility] wire which was advanced into the aorta.  The microsheath was then exchanged for a 5-Fr sheath which was loaded into the common femoral artery.  The pigtail catheter was then loaded over the wire up to the level of ascending aorta.  A power injector arch aortogram was completed at 40 degrees LAO.   I pulled the catheter down into the descending thoracic aorta.  The catheter was exchanged for a H1 catheter.  Unfortunately, the nose of the catheter was too long, so I exchanged it for a BER-2.  Using this catheter I was able to select the left common carotid artery.  The catheter would not advance, due to the angulation so I exchanged the wire for a Versacore wire.  I then exchanged the catheter for a Slipcath.  I removed the wire and the catheter was connected to the power injector circuit.  After de-airring and de-clotting the circuit, a left carotid and cerebral angiogram was completed in antero-posterior and lateral projections.  Based on the images, this patient is a candidate for carotid  stenting.  Given his incomplete treatment of his CAD and worsening cardiac function recently, I doubt he is a candidate for carotid endarterectomy.  At this point, I removed the catheter.  The sheath was aspirated.  No clots  were present and the sheath was reloaded with heparinized saline.  Sheath will be removed in the holding area.  COMPLICATIONS: none  CONDITION: stable  Leonides Sake, MD [facility] [place] Office: [phone] Pager: [phone]  07/30/2013, 12:57 PM

## 2013-07-30 NOTE — Progress Notes (Signed)
UP AND WALKED AND TOL WELL; RIGHT GROIN STABLE; NO BLEEDING OR HEMATOMA 

## 2013-08-05 ENCOUNTER — Encounter: Payer: Self-pay | Admitting: Vascular Surgery

## 2013-08-06 ENCOUNTER — Other Ambulatory Visit: Payer: Self-pay

## 2013-08-06 ENCOUNTER — Encounter: Payer: Self-pay | Admitting: Vascular Surgery

## 2013-08-06 ENCOUNTER — Ambulatory Visit (INDEPENDENT_AMBULATORY_CARE_PROVIDER_SITE_OTHER): Payer: Medicare Other | Admitting: Vascular Surgery

## 2013-08-06 VITALS — BP 111/70 | HR 79 | Ht 66.0 in | Wt 163.0 lb

## 2013-08-06 DIAGNOSIS — I63239 Cerebral infarction due to unspecified occlusion or stenosis of unspecified carotid arteries: Secondary | ICD-10-CM

## 2013-08-06 NOTE — Progress Notes (Signed)
[facility] [place] [facility]ORY AND PHYSICAL   History of Present Illness:  Patient is a 68 y.o. year old male who presents for evaluation of left internal carotid artery stenosis with consideration for carotid stenting. The patient recently had a carotid angiogram performed by my partner Dr. Imogene Burn. This is a high-grade left internal carotid artery stenosis with a slightly high carotid bifurcation and extension the plaque up to the angle of the mandible. The patient has ischemic cardiomyopathy and is thought to be a poor candidate overall for open carotid endarterectomy. He denies any significant or classic TIA amaurosis or stroke symptoms recently. He does have some short-term memory loss. He has a history of some speech problems in the past but does not recall an episode recently. He did have a stroke 7 years ago which left him with intermittent intractable hiccoughs.  Other medical problems include continued tobacco abuse, hyperlipidemia, hypertension, bradycardia, diabetes, and peripheral arterial disease. Dr. Imogene Burn has previously done a right to left femoral-femoral bypass and composite left femoral to posterior tibial bypass on the patient.  Past Medical History  Diagnosis Date  . Hyperlipidemia   . Vitamin B12 deficiency   . Hypertensive heart disease 09/20/2008    Qualifier: Diagnosis of  By: Flonnie Overman    . CAD     a. s/p CABG;  b. NSTEMI 06/2013 - [facility]:  LIMA-LAD patent, SVG-D1 with 99% subtotal occlusion, SVG-OM occluded, proximal to mid circumflex 60-70%, terminal OM 70-80%, both OM branches grafted were occluded, proximal RCA occluded, SVG-right PDA with mid to distal 80-90% and more distal 70%, RPL1 proximal 60% => s/p DES to S-Dx - c/b TIA - staged intervention of S-RPDA placed on hold  . BRADYCARDIA     s/p pacer  . 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 TIA 09/20/2008    Qualifier: Diagnosis of  By: Flonnie Overman    . PSORIASIS 09/20/2008    Qualifier: Diagnosis of  By: Flonnie Overman    . Intractable hiccoughs 08/03/2010  . Dyslipidemia 09/05/2011  . GERD (gastroesophageal reflux disease) 11/06/2011  . Nephrolithiasis 11/06/2011  . Carotid stenosis 11/06/2011    Bilat 60-80% 2010;  Carotid US (06/2013):  RICA 1-39%;  LICA > 80% -  needs L CEA in 6-8 weeks  . Chronic systolic heart failure   . DVT (deep venous thrombosis)   . Type II diabetes mellitus   . History of stomach ulcers   . Cerebellar stroke, acute ~ 2009    residual:  "hiccoughs" (06/09/2013)  . Mini stroke     "several/dr" (06/09/2013)  . Tobacco abuse   . PAD (peripheral artery disease)   . Ischemic cardiomyopathy     a. Echo (06/2013):  EF 20-25%, severe HK of anteroseptum, AK of anterolateral wall, some degree of AS, mild MR, mod LAE, mod TR    Past Surgical History  Procedure Laterality Date  . Insert / replace / remove pacemaker    . Femoral-femoral bypass graft  02/25/2012    Procedure: BYPASS GRAFT FEMORAL-FEMORAL ARTERY;  Surgeon: Fransisco Hertz, MD;  Location: [facility] OR;  Service: Vascular;  Laterality: N/A;  RIGHT TO LEFT Using 31mm x 30 cm Hemashield Graft  . Femoral-tibial bypass graft  02/25/2012    Procedure: BYPASS GRAFT FEMORAL-TIBIAL ARTERY;  Surgeon: Fransisco Hertz, MD;  Location: [facility] OR;  Service: Vascular;  Laterality: Left;  . Endarterectomy femoral  02/25/2012  Procedure: ENDARTERECTOMY FEMORAL;  Surgeon: Fransisco HertzBrian L Chen, MD;  [facility];  Service: Vascular;  Laterality: Bilateral;  . Patch angioplasty  02/25/2012    Procedure: PATCH ANGIOPLASTY;  Surgeon: Fransisco HertzBrian L Chen, MD;  [facility];  Service: Vascular;  Laterality: Bilateral;  using  1 cm x 6 cm vascu guard patch angioplasty  . Intraoperative arteriogram  02/25/2012    Procedure: INTRA OPERATIVE ARTERIOGRAM;  Surgeon: Fransisco HertzBrian L Chen, MD;  [facility];  Service: Vascular;  Laterality: Left;  . Coronary artery bypass graft      "CABG X5"  . Coronary  angioplasty  09/18/2006    WELL PRESERVED LEFT VENTRICULAR SYSTOLIC FUNCTION. THERE APPEARS TBE SOME HYPOKINESIS OF THE POSTERIOR WALL. EF 60%  . Coronary angioplasty with stent placement      "multiple"  . Kidney stone surgery      "cut them out"    Social History History  Substance Use Topics  . Smoking status: Current Every Day Smoker -- 0.50 packs/day for 57 years    Types: Cigarettes  . Smokeless tobacco: Never Used  . Alcohol Use: Yes     Comment: 06/08/2013 "quit in 1994; drank on the weekends before that"    Family History Family History  Problem Relation Age of Onset  . COPD Mother   . Diabetes Mother   . Heart disease Mother   . Hyperlipidemia Mother   . Hypertension Mother   . Heart attack Mother   . Coronary artery disease Father   . Heart disease Father     before age 960  . Hyperlipidemia Father   . Hypertension Father   . Heart attack Father     Allergies  Allergies  Allergen Reactions  . Imdur [Isosorbide Mononitrate]     HEADACHES   . Metformin And Related     ABDOMINAL CRAMPS     Current Outpatient Prescriptions  Medication Sig Dispense Refill  . amLODipine (NORVASC) 5 MG tablet Take 5 mg by mouth daily.       Marland Kitchen. aspirin 81 MG tablet Take 81 mg by mouth daily.        Marland Kitchen. atorvastatin (LIPITOR) 80 MG tablet Take 80 mg by mouth daily.      . carvedilol (COREG) 6.25 MG tablet Take 1 tablet (6.25 mg total) by mouth 2 (two) times daily with a meal.  60 tablet  11  . clopidogrel (PLAVIX) 75 MG tablet Take 75 mg by mouth daily with breakfast.      . dexlansoprazole (DEXILANT) 60 MG capsule Take 60 mg by mouth daily.      Marland Kitchen. ezetimibe (ZETIA) 10 MG tablet Take 10 mg by mouth daily.      . furosemide (LASIX) 40 MG tablet Take 1 tablet (40 mg total) by mouth 2 (two) times daily.  60 tablet  11  . KLOR-CON M20 20 MEQ tablet Take 20 mEq by mouth daily.       Marland Kitchen. losartan (COZAAR) 100 MG tablet Take 1 tablet (100 mg total) by mouth daily.  30 tablet  11  .  meclizine (ANTIVERT) 25 MG tablet Take 25 mg by mouth 3 (three) times daily as needed for dizziness.       . nitroGLYCERIN (NITROSTAT) 0.4 MG SL tablet Place 0.4 mg under the tongue every 5 (five) minutes as needed. For chest pain      . spironolactone (ALDACTONE) 25 MG tablet Take 0.5 tablets (12.5 mg total) by mouth daily.  15 tablet  5   No current facility-administered medications for this visit.    ROS:   General:  No weight loss, Fever, chills  HEENT: No recent headaches, no nasal bleeding, no visual changes, no sore throat  Neurologic: No dizziness, blackouts, seizures. No recent symptoms of stroke or mini- stroke. No recent episodes of slurred speech, or temporary blindness.  Cardiac: No recent episodes of chest pain/pressure, no shortness of breath at rest.  No shortness of breath with exertion.  Denies history of atrial fibrillation or irregular heartbeat  Vascular: No history of rest pain in feet.  No history of claudication.  No history of non-healing ulcer, No history of DVT   Pulmonary: No home oxygen, no productive cough, no hemoptysis,  No asthma or wheezing  Musculoskeletal:  [ ]  Arthritis, [ ]  Low back pain,  [ ]  Joint pain  Hematologic:No history of hypercoagulable state.  No history of easy bleeding.  No history of anemia  Gastrointestinal: No hematochezia or melena,  No gastroesophageal reflux, no trouble swallowing  Urinary: [ ]  chronic Kidney disease, [ ]  on HD - [ ]  MWF or [ ]  TTHS, [ ]  Burning with urination, [ ]  Frequent urination, [ ]  Difficulty urinating;   Skin: No rashes  Psychological: No history of anxiety,  No history of depression   Physical Examination  Filed Vitals:   08/06/13 1157 08/06/13 1200  BP: 139/81 111/70  Pulse: 79   Height: 5\' 6"  (1.676 m)   Weight: 163 lb (73.936 kg)   SpO2: 100%     Body mass index is 26.32 kg/(m^2).  General:  Alert and oriented, no acute distress HEENT: Normal Neck: Bilateral faint carotid  bruits Pulmonary: Clear to auscultation bilaterally Cardiac: Regular Rate and Rhythm without murmur Abdomen: Soft, non-tender, non-distended, no mass Skin: No rash Extremity Pulses:  2+ radial, brachial, femoral, absent dorsalis pedis, posterior tibial pulses bilaterally Musculoskeletal: No deformity or edema  Neurologic: Upper and lower extremity motor 5/5 and symmetric  DATA:  Recent carotid angiogram was reviewed. This shows a type 1 arch there is a greater than 80% stenosis with irregular surface of the artery left internal carotid. Recent carotid duplex scan she is 40-60% right internal carotid artery stenosis greater than 80% left internal carotid artery stenosis with velocities greater than 500 cm/s.   ASSESSMENT:  High-grade asymptomatic left internal carotid artery stenosis with high bifurcation and cardiac risk making stenting a better choice than open carotid endarterectomy.   PLAN:  Left carotid stent 08/25/2013. Risks benefits possible complications and procedure details were turned to the patient and his wife today. These include but are not limited to stroke risk of 5%, bleeding contrast reaction vessel injury myocardial event. The patient understands and agrees to proceed  Fabienne Brunsharles Leobardo Granlund, MD [facility] [place] Office: [phone] Pager: [phone]

## 2013-08-10 ENCOUNTER — Encounter: Payer: Self-pay | Admitting: Internal Medicine

## 2013-08-11 ENCOUNTER — Ambulatory Visit (INDEPENDENT_AMBULATORY_CARE_PROVIDER_SITE_OTHER): Payer: Medicare Other | Admitting: Internal Medicine

## 2013-08-11 ENCOUNTER — Encounter: Payer: Self-pay | Admitting: Internal Medicine

## 2013-08-11 VITALS — BP 132/79 | HR 93 | Ht 66.0 in | Wt 163.0 lb

## 2013-08-11 DIAGNOSIS — I2510 Atherosclerotic heart disease of native coronary artery without angina pectoris: Secondary | ICD-10-CM

## 2013-08-11 DIAGNOSIS — Z720 Tobacco use: Secondary | ICD-10-CM

## 2013-08-11 DIAGNOSIS — Z0000 Encounter for general adult medical examination without abnormal findings: Secondary | ICD-10-CM

## 2013-08-11 DIAGNOSIS — I6529 Occlusion and stenosis of unspecified carotid artery: Secondary | ICD-10-CM

## 2013-08-11 DIAGNOSIS — F17200 Nicotine dependence, unspecified, uncomplicated: Secondary | ICD-10-CM

## 2013-08-11 DIAGNOSIS — I5023 Acute on chronic systolic (congestive) heart failure: Secondary | ICD-10-CM

## 2013-08-11 NOTE — Assessment & Plan Note (Signed)
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 Will recheck in several months. 

## 2013-08-11 NOTE — Progress Notes (Signed)
HPI [name] returns after a long absence from our arrythmia clinic. He is a very pleasant 68 yo man with chronic CAD, graft disease, and symptomatic bradycardia, s/p PPM insertion. He has subsequently been diagnosed with peripheral vascular disease, and is pending carotid stenting. He denies anginal symptoms and has not had syncope but he continues to smoke cigarettes.  Allergies  Allergen Reactions  . Imdur [Isosorbide Mononitrate]     HEADACHES   . Metformin And Related     ABDOMINAL CRAMPS     Current Outpatient Prescriptions  Medication Sig Dispense Refill  . amLODipine (NORVASC) 5 MG tablet Take 5 mg by mouth daily.       Marland Kitchen. aspirin 81 MG tablet Take 81 mg by mouth daily.        Marland Kitchen. atorvastatin (LIPITOR) 80 MG tablet Take 80 mg by mouth daily.      . carvedilol (COREG) 6.25 MG tablet Take 1 tablet (6.25 mg total) by mouth 2 (two) times daily with a meal.  60 tablet  11  . clopidogrel (PLAVIX) 75 MG tablet Take 75 mg by mouth daily with breakfast.      . dexlansoprazole (DEXILANT) 60 MG capsule Take 60 mg by mouth daily.      Marland Kitchen. ezetimibe (ZETIA) 10 MG tablet Take 10 mg by mouth daily.      . furosemide (LASIX) 40 MG tablet Take 1 tablet (40 mg total) by mouth 2 (two) times daily.  60 tablet  11  . losartan (COZAAR) 100 MG tablet Take 1 tablet (100 mg total) by mouth daily.  30 tablet  11  . meclizine (ANTIVERT) 25 MG tablet Take 25 mg by mouth 3 (three) times daily as needed for dizziness.       . nitroGLYCERIN (NITROSTAT) 0.4 MG SL tablet Place 0.4 mg under the tongue every 5 (five) minutes as needed. For chest pain      . spironolactone (ALDACTONE) 25 MG tablet Take 0.5 tablets (12.5 mg total) by mouth daily.  15 tablet  5   No current facility-administered medications for this visit.     Past Medical History  Diagnosis Date  . Hyperlipidemia   . Vitamin B12 deficiency   . Hypertensive heart disease 09/20/2008    Qualifier: Diagnosis of  By: Flonnie OvermanMcLean, Monica    .  CAD     a. s/p CABG;  b. NSTEMI 06/2013 - [facility]:  LIMA-LAD patent, SVG-D1 with 99% subtotal occlusion, SVG-OM occluded, proximal to mid circumflex 60-70%, terminal OM 70-80%, both OM branches grafted were occluded, proximal RCA occluded, SVG-right PDA with mid to distal 80-90% and more distal 70%, RPL1 proximal 60% => s/p DES to S-Dx - c/b TIA - staged intervention of S-RPDA placed on hold  . BRADYCARDIA     s/p pacer  . 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 TIA 09/20/2008    Qualifier: Diagnosis of  By: Flonnie OvermanMcLean, Monica    . PSORIASIS 09/20/2008    Qualifier: Diagnosis of  By: Flonnie OvermanMcLean, Monica    . Intractable hiccoughs 08/03/2010  . Dyslipidemia 09/05/2011  . GERD (gastroesophageal reflux disease) 11/06/2011  . Nephrolithiasis 11/06/2011  . Carotid stenosis 11/06/2011    Bilat 60-80% 2010;  Carotid US (06/2013):  RICA 1-39%;  LICA > 80% -  needs L CEA in 6-8 weeks  . Chronic systolic heart failure   . DVT (deep venous thrombosis)   .  Type II diabetes mellitus   . History of stomach ulcers   . Cerebellar stroke, acute ~ 2009    residual:  "hiccoughs" (06/09/2013)  . Mini stroke     "several/dr" (06/09/2013)  . Tobacco abuse   . PAD (peripheral artery disease)   . Ischemic cardiomyopathy     a. Echo (06/2013):  EF 20-25%, severe HK of anteroseptum, AK of anterolateral wall, some degree of AS, mild MR, mod LAE, mod TR    ROS:   All systems reviewed and negative except as noted in the HPI.   Past Surgical History  Procedure Laterality Date  . Insert / replace / remove pacemaker    . Femoral-femoral bypass graft  02/25/2012    Procedure: BYPASS GRAFT FEMORAL-FEMORAL ARTERY;  Surgeon: Fransisco Hertz, MD;  Location: [facility] OR;  Service: Vascular;  Laterality: N/A;  RIGHT TO LEFT Using 72mm x 30 cm Hemashield Graft  . Femoral-tibial bypass graft  02/25/2012    Procedure: BYPASS GRAFT FEMORAL-TIBIAL ARTERY;  Surgeon: Fransisco Hertz, MD;  Location: [facility] OR;   Service: Vascular;  Laterality: Left;  . Endarterectomy femoral  02/25/2012    Procedure: ENDARTERECTOMY FEMORAL;  Surgeon: Fransisco Hertz, MD;  Location: [facility] OR;  Service: Vascular;  Laterality: Bilateral;  . Patch angioplasty  02/25/2012    Procedure: PATCH ANGIOPLASTY;  Surgeon: Fransisco Hertz, MD;  Location: [facility] OR;  Service: Vascular;  Laterality: Bilateral;  using  1 cm x 6 cm vascu guard patch angioplasty  . Intraoperative arteriogram  02/25/2012    Procedure: INTRA OPERATIVE ARTERIOGRAM;  Surgeon: Fransisco Hertz, MD;  Location: [facility] OR;  Service: Vascular;  Laterality: Left;  . Coronary artery bypass graft      "CABG X5"  . Coronary angioplasty  09/18/2006    WELL PRESERVED LEFT VENTRICULAR SYSTOLIC FUNCTION. THERE APPEARS TBE SOME HYPOKINESIS OF THE POSTERIOR WALL. EF 60%  . Coronary angioplasty with stent placement      "multiple"  . Kidney stone surgery      "cut them out"     Family History  Problem Relation Age of Onset  . COPD Mother   . Diabetes Mother   . Heart disease Mother   . Hyperlipidemia Mother   . Hypertension Mother   . Heart attack Mother   . Coronary artery disease Father   . Heart disease Father     before age 50  . Hyperlipidemia Father   . Hypertension Father   . Heart attack Father      History   Social History  . Marital Status: Single    Spouse Name: N/A    Number of Children: 3  . Years of Education: N/A   Occupational History  . retired    Social History Main Topics  . Smoking status: Current Every Day Smoker -- 0.50 packs/day for 57 years    Types: Cigarettes  . Smokeless tobacco: Never Used  . Alcohol Use: Yes     Comment: 06/08/2013 "quit in 1994; drank on the weekends before that"  . Drug Use: No  . Sexual Activity: No   Other Topics Concern  . Not on file   Social History Narrative  . No narrative on file     BP 132/79  Pulse 93  Ht 5\' 6"  (1.676 m)  Wt 163 lb (73.936 kg)  BMI 26.32 kg/m2  Physical Exam:  diskempt and  chronically ill appearing 68 yo man, NAD HEENT: Unremarkable Neck:  No JVD, no  thyromegally Back:  No CVA tenderness Lungs:  Clear except for scattered wheezes HEART:  Regular rate rhythm, no murmurs, no rubs, no clicks Abd:  soft, positive bowel sounds, no organomegally, no rebound, no guarding Ext:  2 plus pulses, no edema, no cyanosis, no clubbing Skin:  No rashes no nodules Neuro:  CN II through XII intact, motor grossly intact   DEVICE  Normal device function.  See PaceArt for details.   Assess/Plan:

## 2013-08-11 NOTE — Patient Instructions (Signed)
Your physician recommends that you continue on your current medications as directed. Please refer to the Current Medication list given to you today.  Your physician wants you to follow-up in: 1 year with Dr. Taylor.  You will receive a reminder letter in the mail two months in advance. If you don't receive a letter, please call our office to schedule the follow-up appointment.  

## 2013-08-11 NOTE — Assessment & Plan Note (Signed)
He has a 90% left carotid lesion and is pending PCI/stenting with Dr. Imogene Burn in the next few weeks.

## 2013-08-11 NOTE — Assessment & Plan Note (Signed)
I strongly encouraged him to stop smoking though he states he will not.

## 2013-08-11 NOTE — Assessment & Plan Note (Signed)
He has class 2A heart failure symptoms on maximal medical therapy. He is not a good candidate for ICD implant. He has multiple comorbidities and is non-compliant. For now he will continue his current treatment.

## 2013-08-20 ENCOUNTER — Encounter (HOSPITAL_COMMUNITY): Payer: Self-pay | Admitting: Pharmacy Technician

## 2013-08-24 MED ORDER — SODIUM CHLORIDE 0.9 % IV SOLN
INTRAVENOUS | Status: DC
  Administered 2013-08-25: 09:00:00 via INTRAVENOUS

## 2013-08-25 ENCOUNTER — Ambulatory Visit (HOSPITAL_COMMUNITY)
Admission: RE | Admit: 2013-08-25 | Discharge: 2013-08-25 | Disposition: A | Discharge status: Home / Self Care | Payer: Medicare Other | Source: Ambulatory Visit | Attending: Vascular Surgery | Admitting: Vascular Surgery

## 2013-08-25 ENCOUNTER — Encounter (HOSPITAL_COMMUNITY)
Admission: RE | Disposition: A | Discharge status: Home / Self Care | Payer: Self-pay | Source: Ambulatory Visit | Attending: Vascular Surgery

## 2013-08-25 ENCOUNTER — Other Ambulatory Visit: Payer: Self-pay

## 2013-08-25 DIAGNOSIS — I6529 Occlusion and stenosis of unspecified carotid artery: Secondary | ICD-10-CM | POA: Insufficient documentation

## 2013-08-25 DIAGNOSIS — Z538 Procedure and treatment not carried out for other reasons: Secondary | ICD-10-CM | POA: Insufficient documentation

## 2013-08-25 LAB — POCT I-STAT, CHEM 8
BUN: 13 mg/dL (ref 6–23)
Calcium, Ion: 1.23 mmol/L (ref 1.13–1.30)
Chloride: 104 mEq/L (ref 96–112)
Creatinine, Ser: 1.8 mg/dL — ABNORMAL HIGH (ref 0.50–1.35)
Glucose, Bld: 129 mg/dL — ABNORMAL HIGH (ref 70–99)
HEMATOCRIT: 47 % (ref 39.0–52.0)
HEMOGLOBIN: 16 g/dL (ref 13.0–17.0)
Potassium: 3.8 mEq/L (ref 3.7–5.3)
SODIUM: 141 meq/L (ref 137–147)
TCO2: 31 mmol/L (ref 0–100)

## 2013-08-25 LAB — PROTIME-INR
INR: 1.03 (ref 0.00–1.49)
Prothrombin Time: 13.3 seconds (ref 11.6–15.2)

## 2013-08-25 SURGERY — CAROTID STENT INSERTION
Anesthesia: LOCAL

## 2013-08-25 NOTE — H&P (View-Only) (Signed)
[facility] [place] [facility]   History of Present Illness:  Patient is a 68 y.o. year old male who presents for evaluation of left internal carotid artery stenosis with consideration for carotid stenting. The patient recently had a carotid angiogram performed by my partner Dr. Imogene Burn. This is a high-grade left internal carotid artery stenosis with a slightly high carotid bifurcation and extension the plaque up to the angle of the mandible. The patient has ischemic cardiomyopathy and is thought to be a poor candidate overall for open carotid endarterectomy. He denies any significant or classic TIA amaurosis or stroke symptoms recently. He does have some short-term memory loss. He has a history of some speech problems in the past but does not recall an episode recently. He did have a stroke 7 years ago which left him with intermittent intractable hiccoughs.  Other medical problems include continued tobacco abuse, hyperlipidemia, hypertension, bradycardia, diabetes, and peripheral arterial disease. Dr. Imogene Burn has previously done a right to left femoral-femoral bypass and composite left femoral to posterior tibial bypass on the patient.  Past Medical History  Diagnosis Date  . Hyperlipidemia   . Vitamin B12 deficiency   . Hypertensive heart disease 09/20/2008    Qualifier: Diagnosis of  By: Flonnie Overman    . CAD     a. s/p CABG;  b. NSTEMI 06/2013 - [facility]:  LIMA-LAD patent, SVG-D1 with 99% subtotal occlusion, SVG-OM occluded, proximal to mid circumflex 60-70%, terminal OM 70-80%, both OM branches grafted were occluded, proximal RCA occluded, SVG-right PDA with mid to distal 80-90% and more distal 70%, RPL1 proximal 60% => s/p DES to S-Dx - c/b TIA - staged intervention of S-RPDA placed on hold  . BRADYCARDIA     s/p pacer  . 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 TIA 09/20/2008    Qualifier: Diagnosis of  By: Flonnie Overman    . PSORIASIS 09/20/2008    Qualifier: Diagnosis of  By: Flonnie Overman    . Intractable hiccoughs 08/03/2010  . Dyslipidemia 09/05/2011  . GERD (gastroesophageal reflux disease) 11/06/2011  . Nephrolithiasis 11/06/2011  . Carotid stenosis 11/06/2011    Bilat 60-80% 2010;  Carotid US (06/2013):  RICA 1-39%;  LICA > 80% -  needs L CEA in 6-8 weeks  . Chronic systolic heart failure   . DVT (deep venous thrombosis)   . Type II diabetes mellitus   . History of stomach ulcers   . Cerebellar stroke, acute ~ 2009    residual:  "hiccoughs" (06/09/2013)  . Mini stroke     "several/dr" (06/09/2013)  . Tobacco abuse   . PAD (peripheral artery disease)   . Ischemic cardiomyopathy     a. Echo (06/2013):  EF 20-25%, severe HK of anteroseptum, AK of anterolateral wall, some degree of AS, mild MR, mod LAE, mod TR    Past Surgical History  Procedure Laterality Date  . Insert / replace / remove pacemaker    . Femoral-femoral bypass graft  02/25/2012    Procedure: BYPASS GRAFT FEMORAL-FEMORAL ARTERY;  Surgeon: Fransisco Hertz, MD;  Location: [facility] OR;  Service: Vascular;  Laterality: N/A;  RIGHT TO LEFT Using 31mm x 30 cm Hemashield Graft  . Femoral-tibial bypass graft  02/25/2012    Procedure: BYPASS GRAFT FEMORAL-TIBIAL ARTERY;  Surgeon: Fransisco Hertz, MD;  Location: [facility] OR;  Service: Vascular;  Laterality: Left;  . Endarterectomy femoral  02/25/2012  Procedure: ENDARTERECTOMY FEMORAL;  Surgeon: Fransisco HertzBrian L Chen, MD;  [facility];  Service: Vascular;  Laterality: Bilateral;  . Patch angioplasty  02/25/2012    Procedure: PATCH ANGIOPLASTY;  Surgeon: Fransisco HertzBrian L Chen, MD;  [facility];  Service: Vascular;  Laterality: Bilateral;  using  1 cm x 6 cm vascu guard patch angioplasty  . Intraoperative arteriogram  02/25/2012    Procedure: INTRA OPERATIVE ARTERIOGRAM;  Surgeon: Fransisco HertzBrian L Chen, MD;  [facility];  Service: Vascular;  Laterality: Left;  . Coronary artery bypass graft      "CABG X5"  . Coronary  angioplasty  09/18/2006    WELL PRESERVED LEFT VENTRICULAR SYSTOLIC FUNCTION. THERE APPEARS TBE SOME HYPOKINESIS OF THE POSTERIOR WALL. EF 60%  . Coronary angioplasty with stent placement      "multiple"  . Kidney stone surgery      "cut them out"    Social History History  Substance Use Topics  . Smoking status: Current Every Day Smoker -- 0.50 packs/day for 57 years    Types: Cigarettes  . Smokeless tobacco: Never Used  . Alcohol Use: Yes     Comment: 06/08/2013 "quit in 1994; drank on the weekends before that"    Family History Family History  Problem Relation Age of Onset  . COPD Mother   . Diabetes Mother   . Heart disease Mother   . Hyperlipidemia Mother   . Hypertension Mother   . Heart attack Mother   . Coronary artery disease Father   . Heart disease Father     before age 66  . Hyperlipidemia Father   . Hypertension Father   . Heart attack Father     Allergies  Allergies  Allergen Reactions  . Imdur [Isosorbide Mononitrate]     HEADACHES   . Metformin And Related     ABDOMINAL CRAMPS     Current Outpatient Prescriptions  Medication Sig Dispense Refill  . amLODipine (NORVASC) 5 MG tablet Take 5 mg by mouth daily.       Marland Kitchen. aspirin 81 MG tablet Take 81 mg by mouth daily.        Marland Kitchen. atorvastatin (LIPITOR) 80 MG tablet Take 80 mg by mouth daily.      . carvedilol (COREG) 6.25 MG tablet Take 1 tablet (6.25 mg total) by mouth 2 (two) times daily with a meal.  60 tablet  11  . clopidogrel (PLAVIX) 75 MG tablet Take 75 mg by mouth daily with breakfast.      . dexlansoprazole (DEXILANT) 60 MG capsule Take 60 mg by mouth daily.      Marland Kitchen. ezetimibe (ZETIA) 10 MG tablet Take 10 mg by mouth daily.      . furosemide (LASIX) 40 MG tablet Take 1 tablet (40 mg total) by mouth 2 (two) times daily.  60 tablet  11  . KLOR-CON M20 20 MEQ tablet Take 20 mEq by mouth daily.       Marland Kitchen. losartan (COZAAR) 100 MG tablet Take 1 tablet (100 mg total) by mouth daily.  30 tablet  11  .  meclizine (ANTIVERT) 25 MG tablet Take 25 mg by mouth 3 (three) times daily as needed for dizziness.       . nitroGLYCERIN (NITROSTAT) 0.4 MG SL tablet Place 0.4 mg under the tongue every 5 (five) minutes as needed. For chest pain      . spironolactone (ALDACTONE) 25 MG tablet Take 0.5 tablets (12.5 mg total) by mouth daily.  15 tablet  5   No current facility-administered medications for this visit.    ROS:   General:  No weight loss, Fever, chills  HEENT: No recent headaches, no nasal bleeding, no visual changes, no sore throat  Neurologic: No dizziness, blackouts, seizures. No recent symptoms of stroke or mini- stroke. No recent episodes of slurred speech, or temporary blindness.  Cardiac: No recent episodes of chest pain/pressure, no shortness of breath at rest.  No shortness of breath with exertion.  Denies history of atrial fibrillation or irregular heartbeat  Vascular: No history of rest pain in feet.  No history of claudication.  No history of non-healing ulcer, No history of DVT   Pulmonary: No home oxygen, no productive cough, no hemoptysis,  No asthma or wheezing  Musculoskeletal:  [ ]  Arthritis, [ ]  Low back pain,  [ ]  Joint pain  Hematologic:No history of hypercoagulable state.  No history of easy bleeding.  No history of anemia  Gastrointestinal: No hematochezia or melena,  No gastroesophageal reflux, no trouble swallowing  Urinary: [ ]  chronic Kidney disease, [ ]  on HD - [ ]  MWF or [ ]  TTHS, [ ]  Burning with urination, [ ]  Frequent urination, [ ]  Difficulty urinating;   Skin: No rashes  Psychological: No history of anxiety,  No history of depression   Physical Examination  Filed Vitals:   08/06/13 1157 08/06/13 1200  BP: 139/81 111/70  Pulse: 79   Height: 5\' 6"  (1.676 m)   Weight: 163 lb (73.936 kg)   SpO2: 100%     Body mass index is 26.32 kg/(m^2).  General:  Alert and oriented, no acute distress HEENT: Normal Neck: Bilateral faint carotid  bruits Pulmonary: Clear to auscultation bilaterally Cardiac: Regular Rate and Rhythm without murmur Abdomen: Soft, non-tender, non-distended, no mass Skin: No rash Extremity Pulses:  2+ radial, brachial, femoral, absent dorsalis pedis, posterior tibial pulses bilaterally Musculoskeletal: No deformity or edema  Neurologic: Upper and lower extremity motor 5/5 and symmetric  DATA:  Recent carotid angiogram was reviewed. This shows a type 1 arch there is a greater than 80% stenosis with irregular surface of the artery left internal carotid. Recent carotid duplex scan she is 40-60% right internal carotid artery stenosis greater than 80% left internal carotid artery stenosis with velocities greater than 500 cm/s.   ASSESSMENT:  High-grade asymptomatic left internal carotid artery stenosis with high bifurcation and cardiac risk making stenting a better choice than open carotid endarterectomy.   PLAN:  Left carotid stent 08/25/2013. Risks benefits possible complications and procedure details were turned to the patient and his wife today. These include but are not limited to stroke risk of 5%, bleeding contrast reaction vessel injury myocardial event. The patient understands and agrees to proceed  Fabienne Brunsharles Fields, MD [facility] [place] Office: [phone] Pager: [phone]

## 2013-08-25 NOTE — Interval H&P Note (Signed)
History and Physical Interval Note:  08/25/2013 8:39 AM  [name]  has presented today for surgery, with the diagnosis of carotid stenosis  The various methods of treatment have been discussed with the patient and family. After consideration of risks, benefits and other options for treatment, the patient has consented to  Procedure(s): CAROTID STENT INSERTION (N/A) as a surgical intervention .  The patient's history has been reviewed, patient examined, no change in status, stable for surgery.  I have reviewed the patient's chart and labs.  Questions were answered to the patient's satisfaction.     BRABHAM IV, V. WELLS

## 2013-08-25 NOTE — Progress Notes (Signed)
Pt carotid stent cancelled today due to emergency that involved first me and then Dr Myra GianottiBrabham.  Pt will be rescheduled for July 6 or 7  Fabienne Brunsharles Fields, MD [facility] [place] Office: [phone] Pager: [phone]

## 2013-09-07 ENCOUNTER — Encounter (HOSPITAL_COMMUNITY): Payer: Self-pay | Admitting: *Deleted

## 2013-09-07 ENCOUNTER — Inpatient Hospital Stay (HOSPITAL_COMMUNITY)
Admission: RE | Admit: 2013-09-07 | Discharge: 2013-09-08 | DRG: 035 | Disposition: A | Discharge status: Home / Self Care | Payer: Medicare Other | Source: Ambulatory Visit | Attending: Vascular Surgery | Admitting: Vascular Surgery

## 2013-09-07 ENCOUNTER — Encounter (HOSPITAL_COMMUNITY)
Admission: RE | Disposition: A | Discharge status: Home / Self Care | Payer: Medicare Other | Source: Ambulatory Visit | Attending: Vascular Surgery

## 2013-09-07 DIAGNOSIS — I5022 Chronic systolic (congestive) heart failure: Secondary | ICD-10-CM | POA: Diagnosis present

## 2013-09-07 DIAGNOSIS — I252 Old myocardial infarction: Secondary | ICD-10-CM

## 2013-09-07 DIAGNOSIS — R413 Other amnesia: Secondary | ICD-10-CM | POA: Diagnosis present

## 2013-09-07 DIAGNOSIS — I6529 Occlusion and stenosis of unspecified carotid artery: Secondary | ICD-10-CM

## 2013-09-07 DIAGNOSIS — Z9861 Coronary angioplasty status: Secondary | ICD-10-CM

## 2013-09-07 DIAGNOSIS — Z86718 Personal history of other venous thrombosis and embolism: Secondary | ICD-10-CM

## 2013-09-07 DIAGNOSIS — I509 Heart failure, unspecified: Secondary | ICD-10-CM | POA: Diagnosis present

## 2013-09-07 DIAGNOSIS — Z950 Presence of cardiac pacemaker: Secondary | ICD-10-CM

## 2013-09-07 DIAGNOSIS — I2589 Other forms of chronic ischemic heart disease: Secondary | ICD-10-CM | POA: Diagnosis present

## 2013-09-07 DIAGNOSIS — I6522 Occlusion and stenosis of left carotid artery: Secondary | ICD-10-CM

## 2013-09-07 DIAGNOSIS — Z951 Presence of aortocoronary bypass graft: Secondary | ICD-10-CM

## 2013-09-07 DIAGNOSIS — Z7982 Long term (current) use of aspirin: Secondary | ICD-10-CM

## 2013-09-07 DIAGNOSIS — F17200 Nicotine dependence, unspecified, uncomplicated: Secondary | ICD-10-CM | POA: Diagnosis present

## 2013-09-07 DIAGNOSIS — I130 Hypertensive heart and chronic kidney disease with heart failure and stage 1 through stage 4 chronic kidney disease, or unspecified chronic kidney disease: Secondary | ICD-10-CM | POA: Diagnosis present

## 2013-09-07 DIAGNOSIS — N183 Chronic kidney disease, stage 3 unspecified: Secondary | ICD-10-CM | POA: Diagnosis present

## 2013-09-07 DIAGNOSIS — J449 Chronic obstructive pulmonary disease, unspecified: Secondary | ICD-10-CM | POA: Diagnosis present

## 2013-09-07 DIAGNOSIS — E119 Type 2 diabetes mellitus without complications: Secondary | ICD-10-CM | POA: Diagnosis present

## 2013-09-07 DIAGNOSIS — E785 Hyperlipidemia, unspecified: Secondary | ICD-10-CM | POA: Diagnosis present

## 2013-09-07 DIAGNOSIS — J4489 Other specified chronic obstructive pulmonary disease: Secondary | ICD-10-CM | POA: Diagnosis present

## 2013-09-07 DIAGNOSIS — Z8673 Personal history of transient ischemic attack (TIA), and cerebral infarction without residual deficits: Secondary | ICD-10-CM

## 2013-09-07 DIAGNOSIS — I739 Peripheral vascular disease, unspecified: Secondary | ICD-10-CM | POA: Diagnosis present

## 2013-09-07 DIAGNOSIS — I2510 Atherosclerotic heart disease of native coronary artery without angina pectoris: Secondary | ICD-10-CM | POA: Diagnosis present

## 2013-09-07 DIAGNOSIS — N189 Chronic kidney disease, unspecified: Secondary | ICD-10-CM

## 2013-09-07 DIAGNOSIS — K219 Gastro-esophageal reflux disease without esophagitis: Secondary | ICD-10-CM | POA: Diagnosis present

## 2013-09-07 DIAGNOSIS — R066 Hiccough: Secondary | ICD-10-CM | POA: Diagnosis present

## 2013-09-07 DIAGNOSIS — E538 Deficiency of other specified B group vitamins: Secondary | ICD-10-CM | POA: Diagnosis present

## 2013-09-07 HISTORY — PX: CAROTID STENT INSERTION: SHX5505

## 2013-09-07 LAB — POCT ACTIVATED CLOTTING TIME: Activated Clotting Time: 343 seconds

## 2013-09-07 LAB — POCT I-STAT, CHEM 8
BUN: 12 mg/dL (ref 6–23)
Calcium, Ion: 1.29 mmol/L (ref 1.13–1.30)
Chloride: 101 mEq/L (ref 96–112)
Creatinine, Ser: 1.8 mg/dL — ABNORMAL HIGH (ref 0.50–1.35)
Glucose, Bld: 149 mg/dL — ABNORMAL HIGH (ref 70–99)
HEMATOCRIT: 43 % (ref 39.0–52.0)
Hemoglobin: 14.6 g/dL (ref 13.0–17.0)
Potassium: 3.4 mEq/L — ABNORMAL LOW (ref 3.7–5.3)
SODIUM: 142 meq/L (ref 137–147)
TCO2: 24 mmol/L (ref 0–100)

## 2013-09-07 LAB — MRSA PCR SCREENING: MRSA by PCR: NEGATIVE

## 2013-09-07 SURGERY — CAROTID STENT INSERTION
Anesthesia: LOCAL | Laterality: Left

## 2013-09-07 MED ORDER — DOCUSATE SODIUM 100 MG PO CAPS: 100.0000 mg | ORAL_CAPSULE | Freq: Every day | ORAL | Status: DC

## 2013-09-07 MED ORDER — CLOPIDOGREL BISULFATE 75 MG PO TABS
ORAL_TABLET | ORAL | Status: AC
Start: 2013-09-07 — End: 2013-09-07
  Administered 2013-09-07: 75 mg
  Filled 2013-09-07: qty 1

## 2013-09-07 MED ORDER — ACETAMINOPHEN 650 MG RE SUPP: 325.0000 mg | RECTAL | Status: DC | PRN

## 2013-09-07 MED ORDER — LABETALOL HCL 5 MG/ML IV SOLN: 10.0000 mg | INTRAVENOUS | Status: DC | PRN

## 2013-09-07 MED ORDER — MECLIZINE HCL 25 MG PO TABS
25.0000 mg | ORAL_TABLET | Freq: Three times a day (TID) | ORAL | Status: DC | PRN
  Filled 2013-09-07: qty 1

## 2013-09-07 MED ORDER — MORPHINE SULFATE 2 MG/ML IJ SOLN: 2.0000 mg | INTRAMUSCULAR | Status: DC | PRN

## 2013-09-07 MED ORDER — EZETIMIBE 10 MG PO TABS
10.0000 mg | ORAL_TABLET | Freq: Every day | ORAL | Status: DC
  Administered 2013-09-08: 10 mg via ORAL
  Filled 2013-09-07: qty 1

## 2013-09-07 MED ORDER — GUAIFENESIN-DM 100-10 MG/5ML PO SYRP: 15.0000 mL | ORAL_SOLUTION | ORAL | Status: DC | PRN

## 2013-09-07 MED ORDER — CARVEDILOL 6.25 MG PO TABS
6.2500 mg | ORAL_TABLET | Freq: Two times a day (BID) | ORAL | Status: DC
  Filled 2013-09-07 (×4): qty 1

## 2013-09-07 MED ORDER — ONDANSETRON HCL 4 MG/2ML IJ SOLN: 4.0000 mg | Freq: Four times a day (QID) | INTRAMUSCULAR | Status: DC | PRN

## 2013-09-07 MED ORDER — ASPIRIN 81 MG PO CHEW
CHEWABLE_TABLET | ORAL | Status: AC
  Administered 2013-09-07: 81 mg
  Filled 2013-09-07: qty 1

## 2013-09-07 MED ORDER — LIDOCAINE HCL (PF) 1 % IJ SOLN
INTRAMUSCULAR | Status: AC
  Filled 2013-09-07: qty 30

## 2013-09-07 MED ORDER — PHENOL 1.4 % MT LIQD: 1.0000 | OROMUCOSAL | Status: DC | PRN

## 2013-09-07 MED ORDER — PANTOPRAZOLE SODIUM 40 MG PO TBEC
40.0000 mg | DELAYED_RELEASE_TABLET | Freq: Every day | ORAL | Status: DC
  Administered 2013-09-08: 40 mg via ORAL

## 2013-09-07 MED ORDER — SODIUM CHLORIDE 0.45 % IV SOLN
INTRAVENOUS | Status: DC
  Administered 2013-09-07: 12:00:00 via INTRAVENOUS

## 2013-09-07 MED ORDER — ASPIRIN EC 81 MG PO TBEC
81.0000 mg | DELAYED_RELEASE_TABLET | Freq: Every day | ORAL | Status: DC
  Administered 2013-09-08: 81 mg via ORAL
  Filled 2013-09-07: qty 1

## 2013-09-07 MED ORDER — SPIRONOLACTONE 12.5 MG HALF TABLET
12.5000 mg | ORAL_TABLET | Freq: Every day | ORAL | Status: DC
  Filled 2013-09-07: qty 1

## 2013-09-07 MED ORDER — CLOPIDOGREL BISULFATE 75 MG PO TABS
75.0000 mg | ORAL_TABLET | Freq: Every day | ORAL | Status: DC
  Administered 2013-09-08: 75 mg via ORAL
  Filled 2013-09-07 (×2): qty 1

## 2013-09-07 MED ORDER — ALUM & MAG HYDROXIDE-SIMETH 200-200-20 MG/5ML PO SUSP: 15.0000 mL | ORAL | Status: DC | PRN

## 2013-09-07 MED ORDER — LOSARTAN POTASSIUM 50 MG PO TABS
100.0000 mg | ORAL_TABLET | Freq: Every day | ORAL | Status: DC
  Filled 2013-09-07: qty 2

## 2013-09-07 MED ORDER — SODIUM CHLORIDE 0.9 % IV SOLN: 500.0000 mL | Freq: Once | INTRAVENOUS | Status: AC | PRN

## 2013-09-07 MED ORDER — NOREPINEPHRINE BITARTRATE 1 MG/ML IV SOLN
INTRAVENOUS | Status: AC
  Filled 2013-09-07: qty 4

## 2013-09-07 MED ORDER — BIVALIRUDIN 250 MG IV SOLR
INTRAVENOUS | Status: AC
  Filled 2013-09-07: qty 250

## 2013-09-07 MED ORDER — FUROSEMIDE 40 MG PO TABS
40.0000 mg | ORAL_TABLET | Freq: Two times a day (BID) | ORAL | Status: DC
  Administered 2013-09-07 – 2013-09-08 (×2): 40 mg via ORAL
  Filled 2013-09-07 (×4): qty 1

## 2013-09-07 MED ORDER — DOPAMINE-DEXTROSE 3.2-5 MG/ML-% IV SOLN: 3.0000 ug/kg/min | INTRAVENOUS | Status: DC

## 2013-09-07 MED ORDER — ATROPINE SULFATE 0.1 MG/ML IJ SOLN
INTRAMUSCULAR | Status: AC
  Filled 2013-09-07: qty 10

## 2013-09-07 MED ORDER — HYDRALAZINE HCL 20 MG/ML IJ SOLN: 10.0000 mg | INTRAMUSCULAR | Status: DC | PRN

## 2013-09-07 MED ORDER — ATORVASTATIN CALCIUM 80 MG PO TABS
80.0000 mg | ORAL_TABLET | Freq: Every day | ORAL | Status: DC
  Administered 2013-09-07: 80 mg via ORAL
  Filled 2013-09-07 (×2): qty 1

## 2013-09-07 MED ORDER — AMLODIPINE BESYLATE 5 MG PO TABS
5.0000 mg | ORAL_TABLET | Freq: Every day | ORAL | Status: DC
  Filled 2013-09-07: qty 1

## 2013-09-07 MED ORDER — NITROGLYCERIN 0.4 MG SL SUBL: 0.4000 mg | SUBLINGUAL_TABLET | SUBLINGUAL | Status: DC | PRN

## 2013-09-07 MED ORDER — HEPARIN (PORCINE) IN NACL 2-0.9 UNIT/ML-% IJ SOLN
INTRAMUSCULAR | Status: AC
  Filled 2013-09-07: qty 1000

## 2013-09-07 MED ORDER — ACETAMINOPHEN 325 MG PO TABS: 325.0000 mg | ORAL_TABLET | ORAL | Status: DC | PRN

## 2013-09-07 MED ORDER — SODIUM CHLORIDE 0.9 % IV SOLN: INTRAVENOUS | Status: DC

## 2013-09-07 MED ORDER — METOPROLOL TARTRATE 1 MG/ML IV SOLN: 2.0000 mg | INTRAVENOUS | Status: DC | PRN

## 2013-09-07 NOTE — Progress Notes (Signed)
Patient now states the correct month; can't tell me the year.PERRL. Upper and lower extremities equally strong. Continues to have slight rt facial droop. Speech clear and appropriate. Sleeps when not disturbed. Easy to awaken.

## 2013-09-07 NOTE — Progress Notes (Signed)
Family in to see. 

## 2013-09-07 NOTE — Progress Notes (Signed)
Neuro assessment: Hand grips equal; moves both feet equally; speech clear and appropriate but slow; slight rt sided facial droop(Dr. Darrick PennaFields aware); pupils equally round reactive to light; oriented to person and place; could not tell the month nor year(Dr. Darrick PennaFields aware) Sticks tongue out medial, smile is symmetrical.

## 2013-09-07 NOTE — H&P (Signed)
[facility] [place] HISTORY AND PHYSICAL    History of Present Illness:  Patient is a 68 y.o. year old male who presents for evaluation of left internal carotid artery stenosis with consideration for carotid stenting. The patient recently had a carotid angiogram performed by my partner Dr. Imogene Burnhen. This is a high-grade left internal carotid artery stenosis with a slightly high carotid bifurcation and extension the plaque up to the angle of the mandible. The patient has ischemic cardiomyopathy and is thought to be a poor candidate overall for open carotid endarterectomy. He denies any significant or classic TIA amaurosis or stroke symptoms recently. He does have some short-term memory loss. He has a history of some speech problems in the past but does not recall an episode recently. He did have a stroke 7 years ago which left him with intermittent intractable hiccoughs.  Other medical problems include continued tobacco abuse, hyperlipidemia, hypertension, bradycardia, diabetes, and peripheral arterial disease. Dr. Imogene Burnhen has previously done a right to left femoral-femoral bypass and composite left femoral to posterior tibial bypass on the patient.    Past Medical History   Diagnosis  Date   .  Hyperlipidemia     .  Vitamin B12 deficiency     .  Hypertensive heart disease  09/20/2008       Qualifier: Diagnosis of  By: Flonnie OvermanMcLean, Monica     .  CAD         a. s/p CABG;  b. NSTEMI 06/2013 - [facility]:  LIMA-LAD patent, SVG-D1 with 99% subtotal occlusion, SVG-OM occluded, proximal to mid circumflex 60-70%, terminal OM 70-80%, both OM branches grafted were occluded, proximal RCA occluded, SVG-right PDA with mid to distal 80-90% and more distal 70%, RPL1 proximal 60% => s/p DES to S-Dx - c/b TIA - staged intervention of S-RPDA placed on hold   .  BRADYCARDIA         s/p pacer   .  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  TIA  09/20/2008     Qualifier: Diagnosis of  By: Flonnie OvermanMcLean, Monica     .  PSORIASIS  09/20/2008       Qualifier: Diagnosis of  By: Flonnie OvermanMcLean, Monica     .  Intractable hiccoughs  08/03/2010   .  Dyslipidemia  09/05/2011   .  GERD (gastroesophageal reflux disease)  11/06/2011   .  Nephrolithiasis  11/06/2011   .  Carotid stenosis  11/06/2011       Bilat 60-80% 2010;  Carotid US (06/2013):  RICA 1-39%;  LICA > 80% -  needs L CEA in 6-8 weeks   .  Chronic systolic heart failure     .  DVT (deep venous thrombosis)     .  Type II diabetes mellitus     .  History of stomach ulcers     .  Cerebellar stroke, acute  ~ 2009       residual:  "hiccoughs" (06/09/2013)   .  Mini stroke         "several/dr" (06/09/2013)   .  Tobacco abuse     .  PAD (peripheral artery disease)     .  Ischemic cardiomyopathy         a. Echo (06/2013):  EF 20-25%, severe HK of anteroseptum, AK of anterolateral wall, some degree of AS, mild MR, mod LAE, mod TR  Past Surgical History   Procedure  Laterality  Date   .  Insert / replace / remove pacemaker       .  Femoral-femoral bypass graft    02/25/2012       Procedure: BYPASS GRAFT FEMORAL-FEMORAL ARTERY;  Surgeon: Fransisco Hertz, MD;  Location: [facility] OR;  Service: Vascular;  Laterality: N/A;  RIGHT TO LEFT Using 8mm x 30 cm Hemashield Graft   .  Femoral-tibial bypass graft    02/25/2012       Procedure: BYPASS GRAFT FEMORAL-TIBIAL ARTERY;  Surgeon: Fransisco Hertz, MD;  Location[facility] OR;  Service: Vascular;  Laterality: Left;   .  Endarterectomy femoral    02/25/2012       Procedure: ENDARTERECTOMY FEMORAL;  Surgeon: Fransisco Hertz, MD;  Location: [facility] OR;  Service: Vascular;  Laterality: Bilateral;   .  Patch angioplasty    02/25/2012       Procedure: PATCH ANGIOPLASTY;  Surgeon: Fransisco Hertz, MD;  Location: [facility] OR;  Service: Vascular;  Laterality: Bilateral;  using  1 cm x 6 cm vascu guard patch angioplasty   .  Intraoperative arteriogram    02/25/2012       Procedure: INTRA OPERATIVE ARTERIOGRAM;  Surgeon:  Fransisco Hertz, MD;  Location: [facility] OR;  Service: Vascular;  Laterality: Left;   .  Coronary artery bypass graft           "CABG X5"   .  Coronary angioplasty    09/18/2006       WELL PRESERVED LEFT VENTRICULAR SYSTOLIC FUNCTION. THERE APPEARS TBE SOME HYPOKINESIS OF THE POSTERIOR WALL. EF 60%   .  Coronary angioplasty with stent placement           "multiple"   .  Kidney stone surgery           "cut them out"     Social History History   Substance Use Topics   .  Smoking status:  Current Every Day Smoker -- 0.50 packs/day for 57 years       Types:  Cigarettes   .  Smokeless tobacco:  Never Used   .  Alcohol Use:  Yes         Comment: 06/08/2013 "quit in 1994; drank on the weekends before that"     Family History Family History   Problem  Relation  Age of Onset   .  COPD  Mother     .  Diabetes  Mother     .  Heart disease  Mother     .  Hyperlipidemia  Mother     .  Hypertension  Mother     .  Heart attack  Mother     .  Coronary artery disease  Father     .  Heart disease  Father         before age 53   .  Hyperlipidemia  Father     .  Hypertension  Father     .  Heart attack  Father       Allergies    Allergies   Allergen  Reactions   .  Imdur [Isosorbide Mononitrate]         HEADACHES     .  Metformin And Related         ABDOMINAL CRAMPS        Current Outpatient Prescriptions   Medication  Sig  Dispense  Refill   .  amLODipine (NORVASC) 5 MG tablet  Take 5 mg by mouth daily.          Marland Kitchen.  aspirin 81 MG tablet  Take 81 mg by mouth daily.           Marland Kitchen.  atorvastatin (LIPITOR) 80 MG tablet  Take 80 mg by mouth daily.         .  carvedilol (COREG) 6.25 MG tablet  Take 1 tablet (6.25 mg total) by mouth 2 (two) times daily with a meal.   60 tablet   11   .  clopidogrel (PLAVIX) 75 MG tablet  Take 75 mg by mouth daily with breakfast.         .  dexlansoprazole (DEXILANT) 60 MG capsule  Take 60 mg by mouth daily.         Marland Kitchen.  ezetimibe (ZETIA) 10 MG tablet  Take 10 mg by  mouth daily.         .  furosemide (LASIX) 40 MG tablet  Take 1 tablet (40 mg total) by mouth 2 (two) times daily.   60 tablet   11   .  KLOR-CON M20 20 MEQ tablet  Take 20 mEq by mouth daily.          Marland Kitchen.  losartan (COZAAR) 100 MG tablet  Take 1 tablet (100 mg total) by mouth daily.   30 tablet   11   .  meclizine (ANTIVERT) 25 MG tablet  Take 25 mg by mouth 3 (three) times daily as needed for dizziness.          .  nitroGLYCERIN (NITROSTAT) 0.4 MG SL tablet  Place 0.4 mg under the tongue every 5 (five) minutes as needed. For chest pain         .  spironolactone (ALDACTONE) 25 MG tablet  Take 0.5 tablets (12.5 mg total) by mouth daily.   15 tablet   5      No current facility-administered medications for this visit.     ROS:    General:  No weight loss, Fever, chills  HEENT: No recent headaches, no nasal bleeding, no visual changes, no sore throat  Neurologic: No dizziness, blackouts, seizures. No recent symptoms of stroke or mini- stroke. No recent episodes of slurred speech, or temporary blindness.  Cardiac: No recent episodes of chest pain/pressure, no shortness of breath at rest.  No shortness of breath with exertion.  Denies history of atrial fibrillation or irregular heartbeat  Vascular: No history of rest pain in feet.  No history of claudication.  No history of non-healing ulcer, No history of DVT    Pulmonary: No home oxygen, no productive cough, no hemoptysis,  No asthma or wheezing  Musculoskeletal:  [ ]  Arthritis, [ ]  Low back pain,  [ ]  Joint pain  Hematologic:No history of hypercoagulable state.  No history of easy bleeding.  No history of anemia  Gastrointestinal: No hematochezia or melena,  No gastroesophageal reflux, no trouble swallowing  Urinary: [ ]  chronic Kidney disease, [ ]  on HD - [ ]  MWF or [ ]  TTHS, [ ]  Burning with urination, [ ]  Frequent urination, [ ]  Difficulty urinating;    Skin: No rashes  Psychological: No history of anxiety,  No history of  depression   Physical Examination  General:  Alert and oriented, no acute distress HEENT: Normal Neck: Bilateral faint carotid bruits Pulmonary: Clear to auscultation bilaterally Cardiac: Regular Rate and Rhythm without murmur Abdomen: Soft, non-tender, non-distended, no mass  Skin: No rash Extremity Pulses:  2+ radial, brachial, femoral, absent dorsalis pedis, posterior tibial pulses bilaterally Musculoskeletal: No deformity or edema     Neurologic: Upper and lower extremity motor 5/5 and symmetric  DATA:  Recent carotid angiogram was reviewed. This shows a type 1 arch there is a greater than 80% stenosis with irregular surface of the artery left internal carotid. Recent carotid duplex scan she is 40-60% right internal carotid artery stenosis greater than 80% left internal carotid artery stenosis with velocities greater than 500 cm/s.   ASSESSMENT:  High-grade asymptomatic left internal carotid artery stenosis with high bifurcation and cardiac risk making stenting a better choice than open carotid endarterectomy.   PLAN:  Left carotid stent 08/25/2013. Risks benefits possible complications and procedure details were turned to the patient and his wife today. These include but are not limited to stroke risk of 5%, bleeding contrast reaction vessel injury myocardial event. The patient understands and agrees to proceed.  Low but present risk to fem fem bypass as well will need to be watchful with hold of groin post procedure.  Fabienne Bruns, MD [facility] [place] Office: [phone] Pager: [phone]

## 2013-09-07 NOTE — Progress Notes (Signed)
Neurological assessment unchanged.

## 2013-09-07 NOTE — Progress Notes (Signed)
UR Completed Yarden Hillis Graves-Bigelow, RN,BSN [phone]  

## 2013-09-07 NOTE — Progress Notes (Signed)
Dr. Darrick PennaFields by to see patient. Patient can answer what month and year it is. Bilateral hand grips equal; lower extremities equal strength.

## 2013-09-07 NOTE — Plan of Care (Signed)
Problem: Consults Goal: Diagnosis CEA/CES/AAA Stent Carotid Endoscopic Stent (CES)     

## 2013-09-07 NOTE — Progress Notes (Signed)
Patient has not voided since prior to surgery. Patient refuses to attempt to void, stating "I don't care if I don't pee." Educated patient about importance of monitoring fluid status. Will continue to monitor for first void. Will update MD if patient unable to void.

## 2013-09-07 NOTE — Progress Notes (Signed)
Site area: right groin  Site Prior to Removal:  Level 0  Pressure Applied For 25  MINUTES    Manual:   Yes.    Patient Status During Pull:  SBP dropped to 90 5 minutes into sheath pull. Dopamine increased back to 605mcg/kg/min and IVF increased to 500cc/hr for 150cc IVF bolus. BP back up 5 minutes later.   Post Pull Groin Site:  Level 0  Post Pull Instructions Given:  Yes.    Post Pull Pulses Present:  Yes.    Dressing Applied:  Yes.     Bedrest begins 1310  Comments No complications. Rt groin level 0

## 2013-09-07 NOTE — Progress Notes (Signed)
Pt with non focal neuro exam.  Moves all extremities follows commands.  He had some memory deficits month year president immediate post procedure but this seems to be resolving.  Sheath is now out of groin.  To 3 S soon.  Wean dopamine as tolerated.  Fabienne Brunsharles Fields, MD [facility] [place] Office: [phone] Pager: [phone]

## 2013-09-07 NOTE — Op Note (Signed)
Procedure: Left carotid angiogram, left carotid angioplasty and stenting   Preoperative diagnosis: High-grade left internal carotid artery stenosis, asymptomatic Postoperative diagnosis: Same   Anesthesia: Local  Co-surgeon: Nanetta BattyJonathan Berry M.D.   Operative details: After obtaining informed consent, [facility] The patient was placed in supine position the Angio table. Both groins were prepped and draped in usual sterile fashion. Ultrasound was used to identify the patient's right to left femoral-femoral bypass as well as the common femoral artery just above this. Local anesthesia was infiltrated over the right common femoral artery. An introducer needle was placed into the right common femoral artery and there was good backbleeding from this. A 0.035 Versacore wire was then threaded up the abdominal aorta under fluoroscopic guidance. Initially a 6 [place] dilator was placed over the guidewire to dilate up the tract due to scar tissue. An attempt was made to advance the [facility] shuttle sheath over the wire but there were still dense scar. A small nick was made in the skin as well as the track dilated with a hemostat. I again tried to advance the shuttle sheath but it would not go. A 7 French short sheath was placed over the guidewire to dilate the tract further. The versacore wire was then replaced with an 035 Amplatz wire. A 6 French Cook shuttle sheath was then brought up on the operative field and advanced over the guidewire up into the ascending aorta. At this point an infusion of Angiomax was begun. A 5 French JB1 catheter was then advanced up into the aortic arch and a contrast angiogram was performed to determine the precise location of the left common carotid artery. Using the JB1 catheter I then advanced the tip of the catheter into the left common carotid artery. The Versacore wire was removed and exchanged for an 035 Amplatz wire. The Amplatz wire was gently advanced up into  the common carotid artery. The JB1 catheter was then used as a rail to advance the sheath up into the left common carotid near the bifurcation.  At this point the patient began to develop some global hypoperfusion symptoms. His blood pressure was in the mid 70s at this point. He began to have some confusion. He initially had some weakness in the right arm. The patient was given a fluid bolus. As his systemic pressure improved his neurologic symptoms resolved back to baseline.  ACT was also checked and was 343. At this point an Abbott Nav 6 4-7 mm filter was brought on the operative field and this was advanced up to the level of the carotid stenosis.  With some manipulation the guidewire advanced through the stenosis with minimal difficulty. The filter was then advanced into the distal internal carotid artery at the level of the skull base. The filter was then deployed in this location. At this point a repeat contrast angiogram was performed to again determined the level of the stenosis. A 3 x 2 angioplasty balloon was brought in operative field and advanced to the level of stenosis and quickly inflated and deflated to 6 atmospheres. The predilatation balloon was removed. A 9 x 7 x 40 Abbott stent was then brought in the operative field and advanced to the level of stenosis. Again using angiographic guidance, the stent was deployed so that the tip of the stent was past level of the high-grade stenosis. The stent was then deployed using a roller technique.  This was a long lesion. Initially we were planning on bringing  the stent back slightly. However we were occlusive with the stent in the patient again began to have global cerebral symptoms. We elected to deploy the stent at this location and extend proximally. Contrast angiogram showed that we had extended the stent to the origin of the internal carotid artery but not as planned in the common carotid artery. After removing the stent delivery system the patient's  neurologic symptoms again resolved. A postdilatation was then performed after the stent delivery device was removed. This was done with a 5 x 2 balloon inflated to 6 atmospheres up and down. At this point an additional 9 x 30 Abbott stent was advanced up with approximately 1 cm overlap to the first stent. This was then deployed at this location. The stent extended a few centimeters into the common carotid artery. This was then also postdilated at the stent overlap junction with a 5 x 2 balloon inflated to 6 atmospheres up and down. A completion arteriogram was performed which showed that the residual stenosis was essentially 0 although there was a slight shelf in the proximal internal carotid artery in an area of calcification. This did not project intraluminally and the stent had good wall apposition. Completion intracranial views were also performed in AP and lateral projection to be interpreted later by the neuroradiologist. The patient tolerated procedure well and there were no complications. After the completion arteriogram had been performed, the sheath dilator was placed back in the sheath over a guidewire and these were then pulled down to the guidewire and the sheath exchanged for a 6 French short sheath in the right femoral artery. This was thoroughly flushed with heparinized saline and was left in place to be pulled in the holding area after the ACT is less than 175. The patient tolerated the procedure well and there were no neurologic complications other than the brief episodes of what appeared to be hypoperfusion as mentioned in the text of the operative. The patient was transported to the holding area in stable condition. He was able to converse and was alert and oriented x3 and moving upper and lower extremities 5 over 5 symmetrically.  Fabienne Brunsharles Fields, MD  [facility] [place]  Office: [phone]  Pager: [phone]

## 2013-09-07 NOTE — Progress Notes (Signed)
BP stable. Decreased dopamine back to 884mcg/kg/min.

## 2013-09-08 LAB — BASIC METABOLIC PANEL
ANION GAP: 15 (ref 5–15)
BUN: 17 mg/dL (ref 6–23)
CALCIUM: 8.9 mg/dL (ref 8.4–10.5)
CO2: 24 mEq/L (ref 19–32)
Chloride: 97 mEq/L (ref 96–112)
Creatinine, Ser: 1.86 mg/dL — ABNORMAL HIGH (ref 0.50–1.35)
GFR, EST AFRICAN AMERICAN: 42 mL/min — AB (ref >90)
GFR, EST NON AFRICAN AMERICAN: 36 mL/min — AB (ref >90)
GLUCOSE: 223 mg/dL — AB (ref 70–99)
POTASSIUM: 3.4 meq/L — AB (ref 3.7–5.3)
SODIUM: 136 meq/L — AB (ref 137–147)

## 2013-09-08 MED FILL — Sodium Chloride IV Soln 0.9%: INTRAVENOUS | Qty: 50 | Status: AC

## 2013-09-08 NOTE — Discharge Summary (Signed)
[facility] Discharge Summary  [name] February 11, 1946 68 y.o. male  [id number]  Admission Date: 09/07/2013  Discharge Date: 09/08/13  Physician: Jacob Kernsharles E Fields, MD  Admission Diagnosis: left carotid stenosis   HPI:   This is a 68 y.o. male who presents for evaluation of left internal carotid artery stenosis with consideration for carotid stenting. The patient recently had a carotid angiogram performed by my partner Dr. Imogene Snyder. This is a high-grade left internal carotid artery stenosis with a slightly high carotid bifurcation and extension the plaque up to the angle of the mandible. The patient has ischemic cardiomyopathy and is thought to be a poor candidate overall for open carotid endarterectomy. He denies any significant or classic TIA amaurosis or stroke symptoms recently. He does have some short-term memory loss. He has a history of some speech problems in the past but does not recall an episode recently. He did have a stroke 7 years ago which left him with intermittent intractable hiccoughs. Other medical problems include continued tobacco abuse, hyperlipidemia, hypertension, bradycardia, diabetes, and peripheral arterial disease. Dr. Imogene Snyder has previously done a right to left femoral-femoral bypass and composite left femoral to posterior tibial bypass on the patient.  Hospital Course:  The patient was admitted to the hospital and taken to the operating room on 09/07/2013 and underwent left carotid angiogram with left carotid angioplasty and stenting.  There were no neurologic complications other than brief episodes of what appeared to be brief episodes of hypoperfusion (see operative note)  The pt tolerated the procedure well and was transported to the PACU in good condition.  In the recovery area, the pt had a non focal neuro exam moving all extremities and following commands.  He did have some memory deficits with the month, year, and president immediately post  procedure, but this seemed to be resolving.   By POD 1, the pt neuro status was back to baseline and alert and oriented fully.  He did have a + fem fem graft pulse.  Both feet symmetrically warm and well perfused.    The remainder of the hospital course consisted of increasing mobilization and increasing intake of solids without difficulty.    Recent Labs  09/07/13 0855  NA 142  K 3.4*  CL 101  GLUCOSE 149*  BUN 12    Recent Labs  09/07/13 0855  HGB 14.6  HCT 43.0   No results found for this basename: INR,  in the last 72 hours  Discharge Instructions:   The patient is discharged with extensive instructions on wound care and progressive ambulation.  They are instructed not to drive or perform any heavy lifting until returning to see the physician in his office.  Discharge Instructions   CAROTID Sugery: Call MD for difficulty swallowing or speaking; weakness in arms or legs that is a new symtom; severe headache.  If you have increased swelling in the neck and/or  are having difficulty breathing, CALL [phone]    Complete by:  As directed      Call MD for:  redness, tenderness, or signs of infection (pain, swelling, bleeding, redness, odor or green/yellow discharge around incision site)    Complete by:  As directed      Call MD for:  severe or increased pain, loss or decreased feeling  in affected limb(s)    Complete by:  As directed      Call MD for:  temperature >100.5    Complete by:  As directed  Discharge wound care:    Complete by:  As directed   Shower daily with soap and water starting 09/09/13     Driving Restrictions    Complete by:  As directed   No driving for 1 week     Lifting restrictions    Complete by:  As directed   No lifting for 3 weeks     Neuro checks    Complete by:  As directed   q1 hour for first 4 hours post op.  Then q4 hours x 2 then q shift.  Call MD if change in neuro status.     Resume previous diet    Complete by:  As directed             Discharge Diagnosis:  left carotid stenosis  Secondary Diagnosis: Patient Active Problem List   Diagnosis Date Noted  . Carotid stenosis, asymptomatic 09/07/2013  . Occlusion and stenosis of carotid artery with cerebral infarction 07/24/2013  . Atherosclerosis of native arteries of the extremities with rest pain 07/24/2013  . Cardiomyopathy, ischemic 06/14/2013  . COPD (chronic obstructive pulmonary disease) 06/14/2013  . Hypokalemia 06/14/2013  . Acute on chronic systolic heart failure 06/13/2013  . NSTEMI (non-ST elevated myocardial infarction) 06/09/2013  . DOE (dyspnea on exertion) 05/14/2013  . Chronic systolic CHF (congestive heart failure) 11/20/2012  . PAD (peripheral artery disease) 11/20/2012  . Orthopnea 10/21/2012  . Hypersomnolence 09/11/2012  . Right arm pain 09/11/2012  . Dizziness and giddiness 05/23/2012  . Peripheral vascular disease, unspecified 03/14/2012  . Altered mental status 02/20/2012  . Hallucinations 02/20/2012  . Delirium 02/20/2012  . Chronic kidney disease, stage 3 02/19/2012  . Ischemic leg 02/18/2012  . Ischemic heart disease 01/08/2012  . Preventative health care 11/06/2011  . GERD (gastroesophageal reflux disease) 11/06/2011  . Nephrolithiasis 11/06/2011  . Carotid stenosis 11/06/2011  . Dyslipidemia 09/05/2011  . Vascular claudication 11/15/2010  . Pacemaker 09/05/2010  . Benign hypertensive heart disease without heart failure 08/03/2010  . Hx of CABG 08/03/2010  . Intractable hiccoughs 08/03/2010  . Tobacco abuse 08/03/2010  . Cardiac pacemaker in situ 10/08/2008  . DIABETES MELLITUS, TYPE II 09/20/2008  . HYPERTENSION 09/20/2008  . CAD 09/20/2008  . BRADYCARDIA 09/20/2008  . 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 TIA 09/20/2008  . PSORIASIS 09/20/2008   Past Medical History  Diagnosis Date  . Hyperlipidemia   . Vitamin B12 deficiency   . Hypertensive heart disease 09/20/2008    Qualifier: Diagnosis of  By: Flonnie Overman    . CAD     a. s/p CABG;  b. NSTEMI 06/2013 - LHC:  LIMA-LAD patent, SVG-D1 with 99% subtotal occlusion, SVG-OM occluded, proximal to mid circumflex 60-70%, terminal OM 70-80%, both OM branches grafted were occluded, proximal RCA occluded, SVG-right PDA with mid to distal 80-90% and more distal 70%, RPL1 proximal 60% => s/p DES to S-Dx - c/b TIA - staged intervention of S-RPDA placed on hold  . BRADYCARDIA     s/p pacer  . 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 TIA 09/20/2008    Qualifier: Diagnosis of  By: Flonnie Overman    . PSORIASIS 09/20/2008    Qualifier: Diagnosis of  By: Flonnie Overman    . Intractable hiccoughs 08/03/2010  . Dyslipidemia 09/05/2011  . GERD (gastroesophageal reflux disease) 11/06/2011  . Nephrolithiasis 11/06/2011  . Carotid stenosis 11/06/2011    Bilat 60-80% 2010;  Carotid  US (06/2013):  RICA 1-39%;  LICA > 80% -  needs L CEA in 6-8 weeks  . Chronic systolic heart failure   . DVT (deep venous thrombosis)   . Type II diabetes mellitus   . History of stomach ulcers   . Cerebellar stroke, acute ~ 2009    residual:  "hiccoughs" (06/09/2013)  . Mini stroke     "several/dr" (06/09/2013)  . Tobacco abuse   . PAD (peripheral artery disease)   . Ischemic cardiomyopathy     a. Echo (06/2013):  EF 20-25%, severe HK of anteroseptum, AK of anterolateral wall, some degree of AS, mild MR, mod LAE, mod TR      Medication List         amLODipine 5 MG tablet  Commonly known as:  NORVASC  Take 5 mg by mouth daily.     aspirin EC 81 MG tablet  Take 81 mg by mouth daily.     atorvastatin 80 MG tablet  Commonly known as:  LIPITOR  Take 80 mg by mouth daily.     carvedilol 6.25 MG tablet  Commonly known as:  COREG  Take 1 tablet (6.25 mg total) by mouth 2 (two) times daily with a meal.     clopidogrel 75 MG tablet  Commonly known as:  PLAVIX  Take 75 mg by mouth daily with breakfast.     DEXILANT 60 MG capsule    Generic drug:  dexlansoprazole  Take 60 mg by mouth daily.     ezetimibe 10 MG tablet  Commonly known as:  ZETIA  Take 10 mg by mouth daily.     furosemide 40 MG tablet  Commonly known as:  LASIX  Take 1 tablet (40 mg total) by mouth 2 (two) times daily.     losartan 100 MG tablet  Commonly known as:  COZAAR  Take 1 tablet (100 mg total) by mouth daily.     meclizine 25 MG tablet  Commonly known as:  ANTIVERT  Take 25 mg by mouth 3 (three) times daily as needed for dizziness.     nitroGLYCERIN 0.4 MG SL tablet  Commonly known as:  NITROSTAT  Place 0.4 mg under the tongue every 5 (five) minutes as needed. For chest pain     spironolactone 25 MG tablet  Commonly known as:  ALDACTONE  Take 0.5 tablets (12.5 mg total) by mouth daily.         Disposition: home  Patient's condition: is Good  Follow up: 1. Dr.  Darrick PennaFields in 2 weeks.   Doreatha MassedSamantha Maybel Dambrosio, PA-C [facility] [phone]  --- For [facility] use --- Instructions: Press F2 to tab through selections.  Delete question if not applicable.   Modified Rankin score at D/C (0-6): 1  IV medication needed for:  1. Hypertension: No 2. Hypotension: Yes-dopamine  Post-op Complications: No  1. Post-op CVA or TIA: No  If yes: Event classification (right eye, left eye, right cortical, left cortical, verterobasilar, other): n/a  If yes: Timing of event (intra-op, <6 hrs post-op, >=6 hrs post-op, unknown): n/a  2. CN injury: No  If yes: CN n/a injuried   3. Myocardial infarction: No  If yes: Dx by (EKG or clinical, Troponin): n/a  4.  CHF: No  5.  Dysrhythmia (new): No  6. Wound infection: No  7. Reperfusion symptoms: No  8. Return to OR: No  If yes: return to OR for (bleeding, neurologic, other CEA incision, other): n/a  Discharge  medications: Statin use:  Yes If No: [ ]  For Medical reasons, [ ]  Non-compliant, [ ]  Not-indicated ASA use:  Yes  If No: [ ]  For Medical reasons, [ ]   Non-compliant, [ ]  Not-indicated Beta blocker use:  Yes If No: [ ]  For Medical reasons, [ ]  Non-compliant, [ ]  Not-indicated ACE-Inhibitor use:  No If No: [ ]  For Medical reasons, [ ]  Non-compliant, [ ]  Not-indicated ARB use:  Yes P2Y12 Antagonist use: Yes, [x ] Plavix, [ ]  Plasugrel, [ ]  Ticlopinine, [ ]  Ticagrelor, [ ]  Other, [ ]  No for medical reason, [ ]  Non-compliant, [ ]  Not-indicated Anti-coagulant use:  No, [ ]  Warfarin, [ ]  Rivaroxaban, [ ]  Dabigatran, [ ]  Other, [ ]  No for medical reason, [ ]  Non-compliant, [ ]  Not-indicated

## 2013-09-08 NOTE — Progress Notes (Signed)
Discussed discharge instructions and medications with patient. Verbalized understanding with all questions answered. VSS. Pt discharged home with friend.  [facility] Tyliyah Mcmeekin,RN

## 2013-09-08 NOTE — Progress Notes (Signed)
Held patients blood pressure meds due to being on dopamine gtt. Patients last bp was 130/60. Patient is due to take coreg this evening. Patient states he checks his bp daily. When he checks his bp this evening, he will take his bp meds if his sbp>100. He will restart his bp meds on 7/8.

## 2013-09-08 NOTE — Progress Notes (Addendum)
  [facility] Progress Note  09/08/2013 7:21 AM 1 Day Post-Op  Subjective:  No complaints  Tm 99.2 now afebrile HR 60's 90's-110's systolic 95% RA  Filed Vitals:   09/08/13 0640  BP: 95/62  Pulse: 67  Temp:   Resp: 19     Physical Exam: Neuro:  In tact--alert to year, president Incision:  Right groin is soft without hematoma  CBC    Component Value Date/Time   WBC 6.4 06/12/2013 0347   RBC 4.40 06/12/2013 0347   HGB 14.6 09/07/2013 0855   HCT 43.0 09/07/2013 0855   PLT 253 06/12/2013 0347   MCV 83.6 06/12/2013 0347   MCH 27.7 06/12/2013 0347   MCHC 33.2 06/12/2013 0347   RDW 16.1* 06/12/2013 0347   LYMPHSABS 1.5 09/11/2012 1601   MONOABS 0.8 09/11/2012 1601   EOSABS 0.1 09/11/2012 1601   BASOSABS 0.1 09/11/2012 1601    BMET    Component Value Date/Time   NA 142 09/07/2013 0855   K 3.4* 09/07/2013 0855   CL 101 09/07/2013 0855   CO2 28 06/19/2013 1458   GLUCOSE 149* 09/07/2013 0855   BUN 12 09/07/2013 0855   CREATININE 1.80* 09/07/2013 0855   CALCIUM 8.9 06/19/2013 1458   GFRNONAA 44* 06/14/2013 0547   GFRAA 51* 06/14/2013 0547     Intake/Output Summary (Last 24 hours) at 09/08/13 0721 Last data filed at 09/08/13 0425  Gross per 24 hour  Intake 719.45 ml  Output    600 ml  Net 119.45 ml      Assessment/Plan:  This is a 68 y.o. male who is s/p Left carotid angiogram, left carotid angioplasty and stenting   1 Day Post-Op  -pt is doing well this am. -pt neuro exam is in tact -pt has ambulated -pt has voided -pt to continue Plavix -will order stat BMP to check renal function -d/c home later this am   Doreatha MassedSamantha Rhyne, PA-C [facility] [phone]   At neuro baseline, alert and oriented fully UE LE motor 5/5 No groin hematoma + fem fem graft pulse Both feet symmetrically warm and well perfused. Will need baseline carotid duplex and follow with me in 2 weeks. Long term follow up with Dr Imogene Burnhen for his lower extremity bypass  grafts  Fabienne Brunsharles Fields, MD [facility] [place] Office: [phone] Pager: [phone]

## 2013-09-09 ENCOUNTER — Telehealth: Payer: Self-pay | Admitting: Vascular Surgery

## 2013-09-09 ENCOUNTER — Other Ambulatory Visit: Payer: Self-pay

## 2013-09-09 ENCOUNTER — Ambulatory Visit: Payer: Medicare Other | Admitting: Cardiology

## 2013-09-09 DIAGNOSIS — I6529 Occlusion and stenosis of unspecified carotid artery: Secondary | ICD-10-CM

## 2013-09-09 DIAGNOSIS — Z48812 Encounter for surgical aftercare following surgery on the circulatory system: Secondary | ICD-10-CM

## 2013-09-09 NOTE — Telephone Encounter (Addendum)
Message copied by Fredrich BirksMILLIKAN, DANA P on Wed Sep 09, 2013 12:20 PM ------      Message from: Phillips OdorPULLINS, CAROL S      Created: Wed Sep 09, 2013 10:56 AM      Regarding: Steph log; and needs 2 wk vasc study/ ov w/ CEF       S/p left carotid angioplasty/ stenting 7/6            ----- Message -----         From: Dara LordsSamantha J Rhyne, PA-C         Sent: 09/09/2013   8:03 AM           To: [facility] Charge Pool            Needs to f/u with Dr. Darrick PennaFields in 2 weeks with carotid duplex.  He is s/p carotid stent placement.            thx       ------  09/09/13: lm for pt ,dpm

## 2013-09-21 ENCOUNTER — Other Ambulatory Visit (HOSPITAL_COMMUNITY): Payer: Medicare Other

## 2013-09-23 ENCOUNTER — Encounter: Payer: Self-pay | Admitting: Vascular Surgery

## 2013-09-24 ENCOUNTER — Ambulatory Visit (INDEPENDENT_AMBULATORY_CARE_PROVIDER_SITE_OTHER): Payer: Self-pay | Admitting: Vascular Surgery

## 2013-09-24 ENCOUNTER — Encounter: Payer: Self-pay | Admitting: Vascular Surgery

## 2013-09-24 ENCOUNTER — Ambulatory Visit: Payer: Medicare Other | Admitting: Vascular Surgery

## 2013-09-24 ENCOUNTER — Ambulatory Visit (HOSPITAL_COMMUNITY)
Admission: RE | Admit: 2013-09-24 | Discharge: 2013-09-24 | Disposition: A | Discharge status: Home / Self Care | Payer: Medicare Other | Source: Ambulatory Visit | Attending: Vascular Surgery | Admitting: Vascular Surgery

## 2013-09-24 VITALS — BP 124/81 | HR 70 | Ht 66.0 in | Wt 169.2 lb

## 2013-09-24 DIAGNOSIS — Z48812 Encounter for surgical aftercare following surgery on the circulatory system: Secondary | ICD-10-CM | POA: Insufficient documentation

## 2013-09-24 DIAGNOSIS — I2510 Atherosclerotic heart disease of native coronary artery without angina pectoris: Secondary | ICD-10-CM

## 2013-09-24 DIAGNOSIS — I739 Peripheral vascular disease, unspecified: Secondary | ICD-10-CM

## 2013-09-24 DIAGNOSIS — I6529 Occlusion and stenosis of unspecified carotid artery: Secondary | ICD-10-CM | POA: Insufficient documentation

## 2013-09-24 DIAGNOSIS — I63239 Cerebral infarction due to unspecified occlusion or stenosis of unspecified carotid arteries: Secondary | ICD-10-CM

## 2013-09-24 NOTE — Progress Notes (Signed)
Patient is a 68 year old male who returns for followup today after recent left carotid stenting 09/07/2013. He states he has some occasional dizziness when he stands. Otherwise he has no neurologic symptoms currently.  Physical exam: Filed Vitals:   09/24/13 1617 09/24/13 1620  BP: 155/71 124/81  Pulse: 70   Height: 5\' 6"  (1.676 m)   Weight: 169 lb 3.2 oz (76.749 kg)   SpO2: 99%     Orthostatic blood pressure 153/73 pulse rate 77, sitting 147/72 heart rate 75, standing 140/60 heart rate 72  Neck: 2+ carotid pulses Chest: Clear to auscultation bilaterally Extremities: 2+ femoral pulses and easily palpable fem-fem graft pulse Neuro: Symmetric upper and lower extremity motor strength which is 5 over 5 no facial droop  Data: Carotid duplex scan today shows slightly increased velocities of the left proximal subclavian artery although the previous arteriogram did not correlate with this. 2 stents were placed and there is a mid stent diameter change which correlates with this. Velocities in the internal carotid artery are now 144 cm/s compared to greater than 500 pre-stenting  Assessment: Patent left carotid stent, intermittent dizziness which seems positional although he is not orthostatic  Plan: 1.  Followup with Dr. Imogene Burnhen carotid duplex ABIs and graft duplex 6 months #2 continue Plavix and aspirin #3 consider evaluation by neurology if dizziness persists  Fabienne Brunsharles Nevelyn Mellott, MD [facility] [place] Office: [phone] Pager: [phone]

## 2013-09-30 ENCOUNTER — Telehealth: Payer: Self-pay | Admitting: Internal Medicine

## 2013-09-30 ENCOUNTER — Ambulatory Visit (INDEPENDENT_AMBULATORY_CARE_PROVIDER_SITE_OTHER): Payer: Medicare Other | Admitting: Physician Assistant

## 2013-09-30 ENCOUNTER — Encounter: Payer: Self-pay | Admitting: Physician Assistant

## 2013-09-30 VITALS — BP 112/60 | HR 68 | Ht 66.0 in | Wt 167.0 lb

## 2013-09-30 DIAGNOSIS — I509 Heart failure, unspecified: Secondary | ICD-10-CM

## 2013-09-30 DIAGNOSIS — N183 Chronic kidney disease, stage 3 unspecified: Secondary | ICD-10-CM

## 2013-09-30 DIAGNOSIS — I63239 Cerebral infarction due to unspecified occlusion or stenosis of unspecified carotid arteries: Secondary | ICD-10-CM

## 2013-09-30 DIAGNOSIS — E785 Hyperlipidemia, unspecified: Secondary | ICD-10-CM

## 2013-09-30 DIAGNOSIS — I2510 Atherosclerotic heart disease of native coronary artery without angina pectoris: Secondary | ICD-10-CM

## 2013-09-30 DIAGNOSIS — R066 Hiccough: Secondary | ICD-10-CM

## 2013-09-30 DIAGNOSIS — I5022 Chronic systolic (congestive) heart failure: Secondary | ICD-10-CM

## 2013-09-30 DIAGNOSIS — Z720 Tobacco use: Secondary | ICD-10-CM

## 2013-09-30 DIAGNOSIS — Z950 Presence of cardiac pacemaker: Secondary | ICD-10-CM

## 2013-09-30 DIAGNOSIS — F17200 Nicotine dependence, unspecified, uncomplicated: Secondary | ICD-10-CM

## 2013-09-30 DIAGNOSIS — I2589 Other forms of chronic ischemic heart disease: Secondary | ICD-10-CM

## 2013-09-30 DIAGNOSIS — I10 Essential (primary) hypertension: Secondary | ICD-10-CM

## 2013-09-30 DIAGNOSIS — I255 Ischemic cardiomyopathy: Secondary | ICD-10-CM

## 2013-09-30 MED ORDER — PANTOPRAZOLE SODIUM 40 MG PO TBEC: 40.0000 mg | DELAYED_RELEASE_TABLET | Freq: Every day | ORAL | Status: DC

## 2013-09-30 NOTE — Progress Notes (Signed)
Cardiology Office Note    Date:  09/30/2013   ID:  [name], DOB Apr 11, 1945, MRN [id number]  PCP:  Oliver Barre, MD  Cardiologist:  Dr. [name]  Electrophysiologist:  Dr. Lewayne Bunting    History of Present Illness: [name] is a 68 y.o. male with a hx of CAD s/p CABG 2010, prior CVA, HTN, HL, ischemic CM, systolic CHF, CKD, COPD, symptomatic bradycardia s/p PPM.  He was admitted in 4/15 with a non-STEMI. LV function was worse with an EF of 20-25%. Cardiac catheterization demonstrated high-grade disease in the SVG-D1 which was treated with a DES. There was also high-grade disease in SVG-right PDA. Staged intervention was planned. However, he had right facial droop postintervention. This was felt to be a small infarct versus TIA.  Carotid Dopplers demonstrated high-grade disease in the left internal carotid artery. Vascular surgery recommended left carotid endarterectomy. However, this was to be postponed for 6-8 weeks after recent PCI.  In follow up with Dr. [name], medical Rx was planned for remaining CAD.  He saw Dr. Lewayne Bunting in 08/2013.  He was not felt to be a good ICD candidate.  He ultimately underwent L carotid stent with Dr. Darrick Penna 09/07/13.  He returns for follow up.    He has chronic chest pain.  This occurs with exertion.  He describes CCS class 2-3 symptoms.  He has intractable hiccups x years.  Notes CP with this as well.  Has seen GI.  Given thorazine. No benefit.  He has chronic DOE.  NYHA 2b-3.  Denies orthopnea, PND, edema.  No syncope.    Studies:  - LHC (4/15):  Proximal LAD 40-50%, mid LAD stent occluded, perforator ostial 90%, LIMA-LAD patent, SVG-D1 mid 99%, circumflex tandem 60-70%, OM 70-80%, SVG-OM/OM occluded, proximal RCA occluded, SVG-PDA and mid 80-90%, distal 70% >>> PCI:  Resolute Integrity DES 3.5 x 15 mm to the SVG-Dx - plan was to proceed with PCI of SVG-PDA (however patient had CVA and med rx planned)  - Echo (4/15):  EF 20-25%,  diffuse HK, anteroseptal severe HK, anterolateral akinesis, ? Some degree of aortic stenosis, mild MR, moderate LAE, moderate TR  - Carotid US (4/15):  Right 1-39%, left >80%   Recent Labs: 06/09/2013: ALT 7; TSH 4.720*  06/10/2013: HDL Cholesterol by NMR 28*; LDL (calc) 76  09/07/2013: Hemoglobin 14.6  09/08/2013: Creatinine 1.86*; Potassium 3.4*   Wt Readings from Last 3 Encounters:  09/24/13 169 lb 3.2 oz (76.749 kg)  09/07/13 164 lb 0.4 oz (74.4 kg)  09/07/13 164 lb 0.4 oz (74.4 kg)     Past Medical History  Diagnosis Date  . Hyperlipidemia   . Vitamin B12 deficiency   . Hypertensive heart disease 09/20/2008    Qualifier: Diagnosis of  By: Flonnie Overman    . CAD     a. s/p CABG;  b. NSTEMI 06/2013 - LHC:  LIMA-LAD patent, SVG-D1 with 99% subtotal occlusion, SVG-OM occluded, proximal to mid circumflex 60-70%, terminal OM 70-80%, both OM branches grafted were occluded, proximal RCA occluded, SVG-right PDA with mid to distal 80-90% and more distal 70%, RPL1 proximal 60% => s/p DES to S-Dx - c/b TIA - staged intervention of S-RPDA placed on hold  . BRADYCARDIA     s/p pacer  . 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 TIA 09/20/2008    Qualifier: Diagnosis of  By: Flonnie Overman    .  PSORIASIS 09/20/2008    Qualifier: Diagnosis of  By: Flonnie OvermanMcLean, Monica    . Intractable hiccoughs 08/03/2010  . Dyslipidemia 09/05/2011  . GERD (gastroesophageal reflux disease) 11/06/2011  . Nephrolithiasis 11/06/2011  . Carotid stenosis 11/06/2011    Bilat 60-80% 2010;  Carotid US (06/2013):  RICA 1-39%;  LICA > 80% -  needs L CEA in 6-8 weeks >>> s/p L carotid stent 09/2013 (Dr. Darrick PennaFields)   . Chronic systolic heart failure   . DVT (deep venous thrombosis)   . Type II diabetes mellitus   . History of stomach ulcers   . Cerebellar stroke, acute ~ 2009    residual:  "hiccoughs" (06/09/2013)  . Mini stroke     "several/dr" (06/09/2013)  . Tobacco abuse   . PAD  (peripheral artery disease)   . Ischemic cardiomyopathy     a. Echo (06/2013):  EF 20-25%, severe HK of anteroseptum, AK of anterolateral wall, some degree of AS, mild MR, mod LAE, mod TR    Current Outpatient Prescriptions  Medication Sig Dispense Refill  . amLODipine (NORVASC) 5 MG tablet Take 5 mg by mouth daily.       Marland Kitchen. aspirin EC 81 MG tablet Take 81 mg by mouth daily.      Marland Kitchen. atorvastatin (LIPITOR) 80 MG tablet Take 80 mg by mouth daily.      . carvedilol (COREG) 6.25 MG tablet Take 1 tablet (6.25 mg total) by mouth 2 (two) times daily with a meal.  60 tablet  11  . clopidogrel (PLAVIX) 75 MG tablet Take 75 mg by mouth daily with breakfast.      . ezetimibe (ZETIA) 10 MG tablet Take 10 mg by mouth daily.      . furosemide (LASIX) 40 MG tablet Take 1 tablet (40 mg total) by mouth 2 (two) times daily.  60 tablet  11  . losartan (COZAAR) 100 MG tablet Take 1 tablet (100 mg total) by mouth daily.  30 tablet  11  . meclizine (ANTIVERT) 25 MG tablet Take 25 mg by mouth 3 (three) times daily as needed for dizziness.       . nitroGLYCERIN (NITROSTAT) 0.4 MG SL tablet Place 0.4 mg under the tongue every 5 (five) minutes as needed. For chest pain      . pantoprazole (PROTONIX) 40 MG tablet Take 1 tablet (40 mg total) by mouth daily.  90 tablet  3  . spironolactone (ALDACTONE) 25 MG tablet Take 0.5 tablets (12.5 mg total) by mouth daily.  15 tablet  5   No current facility-administered medications for this visit.    Allergies:   Imdur and Metformin and related   Social History:  The patient  reports that he has been smoking Cigarettes.  He has a 28.5 pack-year smoking history. He has never used smokeless tobacco. He reports that he drinks alcohol. He reports that he does not use illicit drugs.   Family History:  The patient's family history includes COPD in his mother; Coronary artery disease in his father; Diabetes in his mother; Heart attack in his father and mother; Heart disease in his father  and mother; Hyperlipidemia in his father and mother; Hypertension in his father and mother.   ROS:  Please see the history of present illness.   + NP cough.   All other systems reviewed and negative.   PHYSICAL EXAM: VS:  There were no vitals taken for this visit. Well nourished, well developed, in no acute distress HEENT: normal Neck: no  JVDat 90 degrees Cardiac:  normal S1, S2; RRR; 1/6 systolic murmurRUSB Lungs:  Decreased breath sounds bilaterally, no wheezing, rhonchi or rales Abd: soft, nontender, no hepatomegaly Ext: no edema Skin: warm and dry Neuro:  CNs 2-12 intact, no focal abnormalities noted  EKG:  Atrial paced, HR 68, LVH with repolarization abnormality, PVCs, no significant change     ASSESSMENT AND PLAN:  Coronary artery disease:  He has chronic angina.  I reviewed his medications.  We discussed adding nitrates.  But, he was intolerant of this in the past.  I am not convinced his BP would tolerate more Norvasc.  He is not an ideal candidate for Ranexa but this could be considered.  His CP predates his NSTEMI in 06/2013.  It is unchanged.  I believe the best option is to continue his current regimen which includes ASA, Plavix, amlodipine, beta blocker, statin.  Cardiomyopathy, ischemic:   Not felt to be ICD candidate by Dr. Lewayne Bunting.  Continue BB, ARB, spironolactone.   Chronic systolic CHF (congestive heart failure):  Volume stable.   Occlusion and stenosis of carotid artery with cerebral infarction: s/p recent L carotid stent.  F/u with [facility].  HYPERTENSION:  Controlled.   Chronic kidney disease, stage 3:  Recent creatinine stable.   Tobacco abuse:  He does not desire to quit.   Cardiac pacemaker in situ:  F/u with EP as planned.  Dyslipidemia:  Continue statin.   Intractable hiccoughs:  This is his biggest complaint.  Not sure what else to offer him.   Disposition:  F/u with Dr. [name] in 2-3 mos.   Signed, Brynda Rim, MHS 09/30/2013  2:22 PM    [facility] [address] [place], [place], [place]  [address] Phone: [phone]; Fax: [phone]

## 2013-09-30 NOTE — Telephone Encounter (Signed)
Pt called stated that Dexilant 60 mg is too expensive, he was wondering if Dr. Jonny Ruiz can give him something cheaper. Please advise.

## 2013-09-30 NOTE — Patient Instructions (Signed)
Your physician recommends that you schedule a follow-up appointment in: 2-3 MONTHS WITH DR. Patty SermonsBRACKBILL  Your physician recommends that you continue on your current medications as directed. Please refer to the Current Medication list given to you today.

## 2013-09-30 NOTE — Telephone Encounter (Signed)
Patient informed of change. 

## 2013-09-30 NOTE — Telephone Encounter (Signed)
Ok to try change to protonix 40 qd

## 2013-10-26 ENCOUNTER — Ambulatory Visit (INDEPENDENT_AMBULATORY_CARE_PROVIDER_SITE_OTHER): Payer: Medicare Other | Admitting: *Deleted

## 2013-10-26 DIAGNOSIS — Z950 Presence of cardiac pacemaker: Secondary | ICD-10-CM

## 2013-10-26 DIAGNOSIS — I498 Other specified cardiac arrhythmias: Secondary | ICD-10-CM

## 2013-10-26 LAB — MDC_IDC_ENUM_SESS_TYPE_INCLINIC
Battery Impedance: 252 Ohm
Battery Voltage: 2.79 V
Brady Statistic AP VS Percent: 19.4 %
Lead Channel Impedance Value: 459 Ohm
Lead Channel Pacing Threshold Amplitude: 1 V
Lead Channel Pacing Threshold Pulse Width: 0.4 ms
Lead Channel Sensing Intrinsic Amplitude: 22.4 mV
Lead Channel Setting Pacing Amplitude: 2.25 V
Lead Channel Setting Pacing Pulse Width: 0.4 ms
MDC IDC MSMT LEADCHNL RA PACING THRESHOLD AMPLITUDE: 1 V
MDC IDC MSMT LEADCHNL RA PACING THRESHOLD PULSEWIDTH: 0.4 ms
MDC IDC MSMT LEADCHNL RA SENSING INTR AMPL: 2 mV
MDC IDC MSMT LEADCHNL RV IMPEDANCE VALUE: 563 Ohm
MDC IDC SET LEADCHNL RV PACING AMPLITUDE: 2.5 V
MDC IDC SET LEADCHNL RV SENSING SENSITIVITY: 5.6 mV
MDC IDC STAT BRADY AP VP PERCENT: 1.4 %
MDC IDC STAT BRADY AS VP PERCENT: 6.5 %
MDC IDC STAT BRADY AS VS PERCENT: 72.7 %

## 2013-10-26 NOTE — Progress Notes (Signed)
Pt c/o of SOB x2weeks; unable to send remote due to bluetooth error w/ [employer].   Normal device function. Thresholds, sensing, impedances consistent with previous measurements. Device programmed to maximize longevity. 1 mode switch--- <0.1%. 10 high ventricular rates---longest 8 sec, fastest 248bpm. Device programmed at appropriate safety margins. Histogram distribution appropriate for patient activity level. Device programmed to optimize intrinsic conduction. Estimated longevity 39yrs. Carelink Smart 01/25/14 & ROV w/ Dr. Ladona Ridgel 08/2014.   Pt will make appt w/ Dr. Patty Sermons for SOB.

## 2013-10-28 ENCOUNTER — Encounter: Payer: Self-pay | Admitting: Cardiology

## 2013-10-28 ENCOUNTER — Ambulatory Visit (INDEPENDENT_AMBULATORY_CARE_PROVIDER_SITE_OTHER): Payer: Medicare Other | Admitting: Cardiology

## 2013-10-28 VITALS — BP 138/74 | HR 88 | Ht 66.0 in | Wt 167.8 lb

## 2013-10-28 DIAGNOSIS — I255 Ischemic cardiomyopathy: Secondary | ICD-10-CM

## 2013-10-28 DIAGNOSIS — I2510 Atherosclerotic heart disease of native coronary artery without angina pectoris: Secondary | ICD-10-CM

## 2013-10-28 DIAGNOSIS — G459 Transient cerebral ischemic attack, unspecified: Secondary | ICD-10-CM

## 2013-10-28 DIAGNOSIS — E7800 Pure hypercholesterolemia, unspecified: Secondary | ICD-10-CM

## 2013-10-28 DIAGNOSIS — I119 Hypertensive heart disease without heart failure: Secondary | ICD-10-CM

## 2013-10-28 DIAGNOSIS — E785 Hyperlipidemia, unspecified: Secondary | ICD-10-CM

## 2013-10-28 DIAGNOSIS — I498 Other specified cardiac arrhythmias: Secondary | ICD-10-CM

## 2013-10-28 DIAGNOSIS — Z720 Tobacco use: Secondary | ICD-10-CM

## 2013-10-28 DIAGNOSIS — F17200 Nicotine dependence, unspecified, uncomplicated: Secondary | ICD-10-CM

## 2013-10-28 DIAGNOSIS — I5023 Acute on chronic systolic (congestive) heart failure: Secondary | ICD-10-CM

## 2013-10-28 DIAGNOSIS — I2589 Other forms of chronic ischemic heart disease: Secondary | ICD-10-CM

## 2013-10-28 DIAGNOSIS — I70229 Atherosclerosis of native arteries of extremities with rest pain, unspecified extremity: Secondary | ICD-10-CM

## 2013-10-28 LAB — LIPID PANEL
CHOLESTEROL: 150 mg/dL (ref 0–200)
HDL: 25.3 mg/dL — ABNORMAL LOW (ref >39.00)
LDL Cholesterol: 99 mg/dL (ref 0–99)
NONHDL: 124.7
Total CHOL/HDL Ratio: 6
Triglycerides: 130 mg/dL (ref 0.0–149.0)
VLDL: 26 mg/dL (ref 0.0–40.0)

## 2013-10-28 LAB — BASIC METABOLIC PANEL
BUN: 9 mg/dL (ref 6–23)
CALCIUM: 9 mg/dL (ref 8.4–10.5)
CO2: 28 mEq/L (ref 19–32)
Chloride: 102 mEq/L (ref 96–112)
Creatinine, Ser: 1.8 mg/dL — ABNORMAL HIGH (ref 0.4–1.5)
GFR: 41.43 mL/min — ABNORMAL LOW (ref >60.00)
Glucose, Bld: 120 mg/dL — ABNORMAL HIGH (ref 70–99)
Potassium: 3.1 mEq/L — ABNORMAL LOW (ref 3.5–5.1)
SODIUM: 138 meq/L (ref 135–145)

## 2013-10-28 LAB — HEPATIC FUNCTION PANEL
ALT: 4 U/L (ref 0–53)
AST: 9 U/L (ref 0–37)
Albumin: 3.3 g/dL — ABNORMAL LOW (ref 3.5–5.2)
Alkaline Phosphatase: 79 U/L (ref 39–117)
BILIRUBIN DIRECT: 0 mg/dL (ref 0.0–0.3)
TOTAL PROTEIN: 7.6 g/dL (ref 6.0–8.3)
Total Bilirubin: 1 mg/dL (ref 0.2–1.2)

## 2013-10-28 NOTE — Progress Notes (Signed)
[name] Date of Birth:  05/02/1945 [facility] [address] [place], [place]  [address] [phone]        Fax   [phone]   History of Present Illness: [name] is a 68 y.o. male with a hx of CAD s/p CABG 2010, prior CVA, HTN, HL, ischemic CM, systolic CHF, CKD, COPD, symptomatic bradycardia s/p PPM. He was admitted in 4/15 with a non-STEMI. LV function was worse with an EF of 20-25%. Cardiac catheterization demonstrated high-grade disease in the SVG-D1 which was treated with a DES. There was also high-grade disease in SVG-right PDA. Staged intervention was planned. However, he had right facial droop postintervention. This was felt to be a small infarct versus TIA. Carotid Dopplers demonstrated high-grade disease in the left internal carotid artery. Vascular surgery recommended left carotid endarterectomy. However, this was to be postponed for 6-8 weeks after recent PCI. In follow up with Dr. [name], medical Rx was planned for remaining CAD. He saw Dr. Lewayne Bunting in 08/2013. He was not felt to be a good ICD candidate. He ultimately underwent L carotid stent with Dr. Darrick Penna 09/07/13. He returns for follow up.  He has chronic chest pain. This occurs with exertion. He describes CCS class 2-3 symptoms. He has intractable hiccups x years. Notes CP with this as well. Has seen GI. Given thorazine. No benefit. He has chronic DOE. NYHA 2b-3. Denies orthopnea, PND, edema. No syncope.  Studies:  - LHC (4/15): Proximal LAD 40-50%, mid LAD stent occluded, perforator ostial 90%, LIMA-LAD patent, SVG-D1 mid 99%, circumflex tandem 60-70%, OM 70-80%, SVG-OM/OM occluded, proximal RCA occluded, SVG-PDA and mid 80-90%, distal 70% >>> PCI: Resolute Integrity DES 3.5 x 15 mm to the SVG-Dx - plan was to proceed with PCI of SVG-PDA (however patient had CVA and med rx planned)  - Echo (4/15): EF 20-25%, diffuse HK, anteroseptal severe HK, anterolateral akinesis, ? Some  degree of aortic stenosis, mild MR, moderate LAE, moderate TR  - Carotid US (4/15): Right 1-39%, left >80%   Current Outpatient Prescriptions  Medication Sig Dispense Refill  . amLODipine (NORVASC) 5 MG tablet Take 5 mg by mouth daily.       Marland Kitchen aspirin EC 81 MG tablet Take 81 mg by mouth daily.      Marland Kitchen atorvastatin (LIPITOR) 80 MG tablet Take 80 mg by mouth daily.      . carvedilol (COREG) 6.25 MG tablet Take 1 tablet (6.25 mg total) by mouth 2 (two) times daily with a meal.  60 tablet  11  . clopidogrel (PLAVIX) 75 MG tablet Take 75 mg by mouth daily with breakfast.      . ezetimibe (ZETIA) 10 MG tablet Take 10 mg by mouth daily.      . furosemide (LASIX) 40 MG tablet Take 1 tablet (40 mg total) by mouth 2 (two) times daily.  60 tablet  11  . losartan (COZAAR) 100 MG tablet Take 1 tablet (100 mg total) by mouth daily.  30 tablet  11  . meclizine (ANTIVERT) 25 MG tablet Take 25 mg by mouth 3 (three) times daily as needed for dizziness.       . nitroGLYCERIN (NITROSTAT) 0.4 MG SL tablet Place 0.4 mg under the tongue every 5 (five) minutes as needed. For chest pain      . spironolactone (ALDACTONE) 25 MG tablet Take 0.5 tablets (12.5 mg total) by mouth daily.  15 tablet  5   No  current facility-administered medications for this visit.    Allergies  Allergen Reactions  . Imdur [Isosorbide Mononitrate] Other (See Comments)    REACTION: HEADACHES   . Metformin And Related Other (See Comments)    REACTION: ABDOMINAL CRAMPS    Patient Active Problem List   Diagnosis Date Noted  . Benign hypertensive heart disease without heart failure 08/03/2010    Priority: High  . Hx of CABG 08/03/2010    Priority: Medium  . Intractable hiccoughs 08/03/2010    Priority: Medium  . Carotid stenosis, asymptomatic 09/07/2013  . Occlusion and stenosis of carotid artery with cerebral infarction 07/24/2013  . Atherosclerosis of native arteries of the extremities with rest pain 07/24/2013  . Cardiomyopathy,  ischemic 06/14/2013  . COPD (chronic obstructive pulmonary disease) 06/14/2013  . Hypokalemia 06/14/2013  . Acute on chronic systolic heart failure 06/13/2013  . NSTEMI (non-ST elevated myocardial infarction) 06/09/2013  . DOE (dyspnea on exertion) 05/14/2013  . Chronic systolic CHF (congestive heart failure) 11/20/2012  . PAD (peripheral artery disease) 11/20/2012  . Orthopnea 10/21/2012  . Hypersomnolence 09/11/2012  . Right arm pain 09/11/2012  . Dizziness and giddiness 05/23/2012  . Peripheral vascular disease, unspecified 03/14/2012  . Altered mental status 02/20/2012  . Hallucinations 02/20/2012  . Delirium 02/20/2012  . Chronic kidney disease, stage 3 02/19/2012  . Ischemic leg 02/18/2012  . Ischemic heart disease 01/08/2012  . Preventative health care 11/06/2011  . GERD (gastroesophageal reflux disease) 11/06/2011  . Nephrolithiasis 11/06/2011  . Carotid stenosis 11/06/2011  . Dyslipidemia 09/05/2011  . Vascular claudication 11/15/2010  . Pacemaker 09/05/2010  . Tobacco abuse 08/03/2010  . Cardiac pacemaker in situ 10/08/2008  . DIABETES MELLITUS, TYPE II 09/20/2008  . HYPERTENSION 09/20/2008  . CAD 09/20/2008  . BRADYCARDIA 09/20/2008  . 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 TIA 09/20/2008  . PSORIASIS 09/20/2008    History  Smoking status  . Current Every Day Smoker -- 0.50 packs/day for 57 years  . Types: Cigarettes  Smokeless tobacco  . Never Used    History  Alcohol Use  . Yes    Comment: 06/08/2013 "quit in 1994; drank on the weekends before that"    Family History  Problem Relation Age of Onset  . COPD Mother   . Diabetes Mother   . Heart disease Mother   . Hyperlipidemia Mother   . Hypertension Mother   . Heart attack Mother   . Coronary artery disease Father   . Heart disease Father     before age 61  . Hyperlipidemia Father   . Hypertension Father   . Heart attack Father     Review of Systems: Constitutional: no fever chills  diaphoresis or fatigue or change in weight.  Head and neck: no hearing loss, no epistaxis, no photophobia or visual disturbance. Respiratory: No cough, shortness of breath or wheezing. Cardiovascular: No chest pain peripheral edema, palpitations. Gastrointestinal: No abdominal distention, no abdominal pain, no change in bowel habits hematochezia or melena. Genitourinary: No dysuria, no frequency, no urgency, no nocturia. Musculoskeletal:No arthralgias, no back pain, no gait disturbance or myalgias. Neurological: No dizziness, no headaches, no numbness, no seizures, no syncope, no weakness, no tremors. Hematologic: No lymphadenopathy, no easy bruising. Psychiatric: No confusion, no hallucinations, no sleep disturbance.    Physical Exam: Filed Vitals:   10/28/13 1014  BP: 138/74  Pulse: 88  The patient appears to be in no distress.  Head and neck exam reveals that the pupils are equal and reactive.  The extraocular movements are full.  There is no scleral icterus.  Mouth and pharynx are benign.  No lymphadenopathy.  Soft left carotid bruit.  The jugular venous pressure is normal.  Thyroid is not enlarged or tender.  Chest reveals scattered rhonchi. Expansion of the chest is symmetrical.  Heart reveals no abnormal lift or heave.  First and second heart sounds are normal.  There is no  gallop rub or click.  Soft systolic ejection murmur at base.  The abdomen is soft and nontender.  Bowel sounds are normoactive.  There is no hepatosplenomegaly or mass.  There are no abdominal bruits.  Extremities reveal no phlebitis or edema.  Pedal pulses absent in left foot and he has a weak posterior tibial pulse palpable in the right foot.  There is no cyanosis or clubbing.  Neurologic exam is normal strength and no lateralizing weakness.  No sensory deficits.  Integument reveals no rash    Assessment / Plan: 1.  Ischemic heart disease traction fraction of 20-25%. 2. peripheral arterial occlusive  disease with claudication 3. carotid artery disease status post recent stent to left internal carotid 4. COPD 5. Dyslipidemia 6. Pacemaker 7. chronic hiccups occurring every 3 days or so 8.  Ongoing tobacco abuse  Plan: Continue current medication.  Check fasting lab work today.  Again urged him to quit smoking. Recheck in 3 months for office visit and basal metabolic panel

## 2013-10-28 NOTE — Assessment & Plan Note (Signed)
Patient had successful stenting of his left carotid artery in July 2015.  He has had no subsequent TIA symptoms.

## 2013-10-28 NOTE — Patient Instructions (Signed)
Will obtain labs today and call you with the results (lp/bmet/hfp)  Your physician recommends that you continue on your current medications as directed. Please refer to the Current Medication list given to you today.  Your physician recommends that you schedule a follow-up appointment in: 3 month ov/bmet

## 2013-10-28 NOTE — Progress Notes (Signed)
Quick Note:  Please report to patient. The recent labs are stable. Continue same medication and careful diet. Potassium remains low. Add KDur 20 meq one daily. Recheck BMET In 2 weeks. ______

## 2013-10-28 NOTE — Assessment & Plan Note (Signed)
The patient has bilateral claudication worse on the left.  He has absent left pedal pulses.  The patient continues to smoke about a pack of cigarettes a day

## 2013-10-28 NOTE — Assessment & Plan Note (Signed)
Patient remains on ezetimibe and atorvastatin for his lipidemia.  Lab work drawn today and is pending.  Dietary compliance has been poor.

## 2013-10-28 NOTE — Assessment & Plan Note (Signed)
The patient continues to have significant exertional dyspnea on minimal effort.  He rarely takes any sublingual nitroglycerin.  He took nitroglycerin last night and did not think it helped his dyspnea or chest discomfort.

## 2013-10-28 NOTE — Assessment & Plan Note (Signed)
Patient has a functioning dual-chamber pacemaker.  Last pacer check was 10/26/13

## 2013-10-29 ENCOUNTER — Telehealth: Payer: Self-pay

## 2013-10-29 DIAGNOSIS — E876 Hypokalemia: Secondary | ICD-10-CM

## 2013-10-29 NOTE — Telephone Encounter (Signed)
called to give pt lab results.lmtcb 

## 2013-10-29 NOTE — Telephone Encounter (Signed)
Message copied by Jarvis Newcomer on Thu Oct 29, 2013  9:47 AM ------      Message from: [name]      Created: Wed Oct 28, 2013  9:02 PM       Please report to patient.  The recent labs are stable. Continue same medication and careful diet. Potassium remains low.  Add KDur 20 meq one daily.  Recheck BMET  In 2 weeks. ------

## 2013-10-30 ENCOUNTER — Other Ambulatory Visit: Payer: Self-pay

## 2013-10-30 MED ORDER — POTASSIUM CHLORIDE CRYS ER 20 MEQ PO TBCR: 20.0000 meq | EXTENDED_RELEASE_TABLET | Freq: Every day | ORAL | Status: DC

## 2013-10-30 MED ORDER — NITROGLYCERIN 0.4 MG SL SUBL: 0.4000 mg | SUBLINGUAL_TABLET | SUBLINGUAL | Status: AC | PRN

## 2013-10-30 NOTE — Telephone Encounter (Signed)
Follow up  ° ° °Calling back for test results.   °

## 2013-10-30 NOTE — Telephone Encounter (Signed)
Patient's girlfriend, Junious Dresser, advised, verbalized understanding.

## 2013-11-03 ENCOUNTER — Telehealth: Payer: Self-pay | Admitting: Cardiology

## 2013-11-03 NOTE — Telephone Encounter (Signed)
New problem:    Per pt's wife called pt can not take the Potasium pill to big.  He would like something smaller, please give him a call.

## 2013-11-03 NOTE — Telephone Encounter (Signed)
Left message to call back  

## 2013-11-06 ENCOUNTER — Encounter: Payer: Self-pay | Admitting: Internal Medicine

## 2013-11-13 ENCOUNTER — Encounter: Payer: Self-pay | Admitting: Internal Medicine

## 2013-11-13 ENCOUNTER — Ambulatory Visit (INDEPENDENT_AMBULATORY_CARE_PROVIDER_SITE_OTHER): Payer: Medicare Other | Admitting: Internal Medicine

## 2013-11-13 ENCOUNTER — Other Ambulatory Visit (INDEPENDENT_AMBULATORY_CARE_PROVIDER_SITE_OTHER): Payer: Medicare Other

## 2013-11-13 ENCOUNTER — Other Ambulatory Visit: Payer: Medicare Other

## 2013-11-13 VITALS — BP 118/78 | HR 77 | Temp 97.9°F | Wt 163.8 lb

## 2013-11-13 DIAGNOSIS — I10 Essential (primary) hypertension: Secondary | ICD-10-CM

## 2013-11-13 DIAGNOSIS — N320 Bladder-neck obstruction: Secondary | ICD-10-CM

## 2013-11-13 DIAGNOSIS — E785 Hyperlipidemia, unspecified: Secondary | ICD-10-CM

## 2013-11-13 DIAGNOSIS — I2510 Atherosclerotic heart disease of native coronary artery without angina pectoris: Secondary | ICD-10-CM

## 2013-11-13 DIAGNOSIS — E119 Type 2 diabetes mellitus without complications: Secondary | ICD-10-CM

## 2013-11-13 DIAGNOSIS — B370 Candidal stomatitis: Secondary | ICD-10-CM

## 2013-11-13 LAB — HEMOGLOBIN A1C: Hgb A1c MFr Bld: 7.1 % — ABNORMAL HIGH (ref 4.6–6.5)

## 2013-11-13 MED ORDER — NYSTATIN 100000 UNIT/ML MT SUSP: 500000.0000 [IU] | Freq: Four times a day (QID) | OROMUCOSAL | Status: AC

## 2013-11-13 NOTE — Patient Instructions (Signed)
Please take all new medication as prescribed - the solution for the thrush  Please stop smoking  Please continue all other medications as before, and refills have been done if requested.  Please have the pharmacy call with any other refills you may need.  Please keep your appointments with your specialists as you may have planned  Please go to the LAB in the Basement (turn left off the elevator) for the tests to be done today  You will be contacted by phone if any changes need to be made immediately.  Otherwise, you will receive a letter about your results with an explanation, but please check with MyChart first.  Please remember to sign up for MyChart if you have not done so, as this will be important to you in the future with finding out test results, communicating by private email, and scheduling acute appointments online when needed.  Please return in 6 months, or sooner if needed

## 2013-11-13 NOTE — Progress Notes (Signed)
Subjective:    Patient ID: [name], male    DOB: [birth year], 68 y.o.   MRN: [id number]  HPI  Here to f/u with wife; overall doing ok,  Pt denies chest pain, increased sob or doe, wheezing, orthopnea, PND, increased LE swelling, palpitations, dizziness or syncope.  Pt denies polydipsia, polyuria, or low sugar symptoms such as weakness or confusion improved with po intake.  Pt denies new neurological symptoms such as new headache, or facial or extremity weakness or numbness.   Pt states overall good compliance with meds, has been trying to follow lower cholesterol, diabetic diet, with wt overall stable,  but little exercise however.  Also with loss of taste recent, ? Tongue rash, for several weeks to over a month. No tongue pain or swelling. Past Medical History  Diagnosis Date  . Hyperlipidemia   . Vitamin B12 deficiency   . Hypertensive heart disease 09/20/2008    Qualifier: Diagnosis of  By: Flonnie Overman    . CAD     a. s/p CABG;  b. NSTEMI 06/2013 - LHC:  LIMA-LAD patent, SVG-D1 with 99% subtotal occlusion, SVG-OM occluded, proximal to mid circumflex 60-70%, terminal OM 70-80%, both OM branches grafted were occluded, proximal RCA occluded, SVG-right PDA with mid to distal 80-90% and more distal 70%, RPL1 proximal 60% => s/p DES to S-Dx - c/b TIA - staged intervention of S-RPDA placed on hold  . BRADYCARDIA     s/p pacer  . 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 TIA 09/20/2008    Qualifier: Diagnosis of  By: Flonnie Overman    . PSORIASIS 09/20/2008    Qualifier: Diagnosis of  By: Flonnie Overman    . Intractable hiccoughs 08/03/2010  . Dyslipidemia 09/05/2011  . GERD (gastroesophageal reflux disease) 11/06/2011  . Nephrolithiasis 11/06/2011  . Carotid stenosis 11/06/2011    Bilat 60-80% 2010;  Carotid US (06/2013):  RICA 1-39%;  LICA > 80% -  needs L CEA in 6-8 weeks >>> s/p L carotid stent 09/2013 (Dr. Darrick Penna)   . Chronic systolic heart  failure   . DVT (deep venous thrombosis)   . Type II diabetes mellitus   . History of stomach ulcers   . Cerebellar stroke, acute ~ 2009    residual:  "hiccoughs" (06/09/2013)  . Mini stroke     "several/dr" (06/09/2013)  . Tobacco abuse   . PAD (peripheral artery disease)   . Ischemic cardiomyopathy     a. Echo (06/2013):  EF 20-25%, severe HK of anteroseptum, AK of anterolateral wall, some degree of AS, mild MR, mod LAE, mod TR   Past Surgical History  Procedure Laterality Date  . Insert / replace / remove pacemaker    . Femoral-femoral bypass graft  02/25/2012    Procedure: BYPASS GRAFT FEMORAL-FEMORAL ARTERY;  Surgeon: Fransisco Hertz, MD;  Location: [facility] OR;  Service: Vascular;  Laterality: N/A;  RIGHT TO LEFT Using 8mm x 30 cm Hemashield Graft  . Femoral-tibial bypass graft  02/25/2012    Procedure: BYPASS GRAFT FEMORAL-TIBIAL ARTERY;  Surgeon: Fransisco Hertz, MD;  Location: [facility] OR;  Service: Vascular;  Laterality: Left;  . Endarterectomy femoral  02/25/2012    Procedure: ENDARTERECTOMY FEMORAL;  Surgeon: Fransisco Hertz, MD;  Location: [facility] OR;  Service: Vascular;  Laterality: Bilateral;  . Patch angioplasty  02/25/2012    Procedure: PATCH ANGIOPLASTY;  Surgeon: Fransisco Hertz, MD;  Location: [facility] OR;  Service: Vascular;  Laterality: Bilateral;  using  1 cm x 6 cm vascu guard patch angioplasty  . Intraoperative arteriogram  02/25/2012    Procedure: INTRA OPERATIVE ARTERIOGRAM;  Surgeon: Fransisco Hertz, MD;  Location: [facility] OR;  Service: Vascular;  Laterality: Left;  . Coronary artery bypass graft      "CABG X5"  . Coronary angioplasty  09/18/2006    WELL PRESERVED LEFT VENTRICULAR SYSTOLIC FUNCTION. THERE APPEARS TBE SOME HYPOKINESIS OF THE POSTERIOR WALL. EF 60%  . Coronary angioplasty with stent placement      "multiple"  . Kidney stone surgery      "cut them out"    reports that he has been smoking Cigarettes.  He has a 28.5 pack-year smoking history. He has never used smokeless tobacco. He reports  that he drinks alcohol. He reports that he does not use illicit drugs. family history includes COPD in his mother; Coronary artery disease in his father; Diabetes in his mother; Heart attack in his father and mother; Heart disease in his father and mother; Hyperlipidemia in his father and mother; Hypertension in his father and mother. Allergies  Allergen Reactions  . Imdur [Isosorbide Mononitrate] Other (See Comments)    REACTION: HEADACHES   . Metformin And Related Other (See Comments)    REACTION: ABDOMINAL CRAMPS   Review of Systems  Constitutional: Negative for unusual diaphoresis or other sweats  HENT: Negative for ringing in ear Eyes: Negative for double vision or worsening visual disturbance.  Respiratory: Negative for choking and stridor.   Gastrointestinal: Negative for vomiting or other signifcant bowel change Genitourinary: Negative for hematuria or decreased urine volume.  Musculoskeletal: Negative for other MSK pain or swelling Skin: Negative for color change and worsening wound.  Neurological: Negative for tremors and numbness other than noted  Psychiatric/Behavioral: Negative for decreased concentration or agitation other than above       Objective:   Physical Exam BP 118/78  Pulse 77  Temp(Src) 97.9 F (36.6 C) (Oral)  Wt 163 lb 12 oz (74.277 kg)  SpO2 98% VS noted,  Constitutional: Pt appears well-developed, well-nourished.  HENT: Head: NCAT.  Tongue with faint whitish coating, NT, no swelling Right Ear: External ear normal.  Left Ear: External ear normal.  Eyes: . Pupils are equal, round, and reactive to light. Conjunctivae and EOM are normal Neck: Normal range of motion. Neck supple.  Cardiovascular: Normal rate and regular rhythm.   Pulmonary/Chest: Effort normal and breath sounds decr BS, no rales or wheezing.  Neurological: Pt is alert. Not confused , motor grossly intact Skin: Skin is warm. No rash Psychiatric: Pt behavior is normal. No agitation.      Assessment & Plan:

## 2013-11-13 NOTE — Progress Notes (Signed)
Pre visit review using our clinic review tool, if applicable. No additional management support is needed unless otherwise documented below in the visit note. 

## 2013-11-14 DIAGNOSIS — B370 Candidal stomatitis: Secondary | ICD-10-CM | POA: Insufficient documentation

## 2013-11-14 DIAGNOSIS — N320 Bladder-neck obstruction: Secondary | ICD-10-CM | POA: Insufficient documentation

## 2013-11-14 NOTE — Assessment & Plan Note (Signed)
Also for psa as he is due 

## 2013-11-14 NOTE — Assessment & Plan Note (Signed)
stable overall by history and exam, recent data reviewed with pt, and pt to continue medical treatment as before,  to f/u any worsening symptoms or concerns BP Readings from Last 3 Encounters:  11/13/13 118/78  10/28/13 138/74  09/30/13 112/60

## 2013-11-14 NOTE — Assessment & Plan Note (Signed)
stable overall by history and exam, recent data reviewed with pt, and pt to continue medical treatment as before,  to f/u any worsening symptoms or concerns Lab Results  Component Value Date   HGBA1C 7.7* 06/12/2013   For f/u labs

## 2013-11-14 NOTE — Assessment & Plan Note (Signed)
Mild to mod, for nystatin soln course,  to f/u any worsening symptoms or concerns 

## 2013-11-14 NOTE — Assessment & Plan Note (Signed)
stable overall by history and exam, recent data reviewed with pt, and pt to continue medical treatment as before,  to f/u any worsening symptoms or concerns Lab Results  Component Value Date   LDLCALC 99 10/28/2013

## 2013-11-16 LAB — PSA: PSA: 0.91 ng/mL (ref 0.10–4.00)

## 2013-11-29 ENCOUNTER — Other Ambulatory Visit: Payer: Self-pay | Admitting: Cardiology

## 2013-11-29 DIAGNOSIS — R42 Dizziness and giddiness: Secondary | ICD-10-CM

## 2013-12-09 NOTE — Telephone Encounter (Signed)
Still unable to reach

## 2014-01-06 ENCOUNTER — Ambulatory Visit: Payer: Medicare Other | Admitting: Cardiology

## 2014-01-25 ENCOUNTER — Encounter: Payer: Medicare Other | Admitting: *Deleted

## 2014-01-25 ENCOUNTER — Telehealth: Payer: Self-pay | Admitting: Cardiology

## 2014-01-25 NOTE — Telephone Encounter (Signed)
LMOVM reminding pt to send remote transmission.   

## 2014-01-26 ENCOUNTER — Encounter: Payer: Self-pay | Admitting: Cardiology

## 2014-02-11 ENCOUNTER — Encounter (HOSPITAL_COMMUNITY): Payer: Self-pay | Admitting: Vascular Surgery

## 2014-02-11 ENCOUNTER — Ambulatory Visit (INDEPENDENT_AMBULATORY_CARE_PROVIDER_SITE_OTHER): Payer: Medicare Other | Admitting: Internal Medicine

## 2014-02-11 VITALS — BP 140/90 | HR 75 | Temp 97.4°F | Ht 66.0 in | Wt 162.0 lb

## 2014-02-11 DIAGNOSIS — I10 Essential (primary) hypertension: Secondary | ICD-10-CM

## 2014-02-11 DIAGNOSIS — J441 Chronic obstructive pulmonary disease with (acute) exacerbation: Secondary | ICD-10-CM

## 2014-02-11 DIAGNOSIS — I2510 Atherosclerotic heart disease of native coronary artery without angina pectoris: Secondary | ICD-10-CM

## 2014-02-11 DIAGNOSIS — Z720 Tobacco use: Secondary | ICD-10-CM

## 2014-02-11 DIAGNOSIS — J209 Acute bronchitis, unspecified: Secondary | ICD-10-CM

## 2014-02-11 MED ORDER — LEVOFLOXACIN 250 MG PO TABS: 250.0000 mg | ORAL_TABLET | Freq: Every day | ORAL | Status: AC

## 2014-02-11 MED ORDER — METHYLPREDNISOLONE ACETATE 80 MG/ML IJ SUSP
80.0000 mg | Freq: Once | INTRAMUSCULAR | Status: AC
  Administered 2014-02-11: 80 mg via INTRAMUSCULAR

## 2014-02-11 MED ORDER — PREDNISONE 10 MG PO TABS
ORAL_TABLET | ORAL | Status: AC
Start: 2014-02-11 — End: ?

## 2014-02-11 MED ORDER — ALBUTEROL SULFATE HFA 108 (90 BASE) MCG/ACT IN AERS: 2.0000 | INHALATION_SPRAY | Freq: Four times a day (QID) | RESPIRATORY_TRACT | Status: AC | PRN

## 2014-02-11 MED ORDER — HYDROCODONE-HOMATROPINE 5-1.5 MG/5ML PO SYRP: 5.0000 mL | ORAL_SOLUTION | Freq: Four times a day (QID) | ORAL | Status: AC | PRN

## 2014-02-11 NOTE — Patient Instructions (Addendum)
You had the steroid shot today  Please take all new medication as prescribed - the antibiotic, cough medicine, prednisone, and inhaler  Please continue all other medications as before, and refills have been done if requested.  Please have the pharmacy call with any other refills you may need.  Please keep your appointments with your specialists as you may have planned  You will be contacted regarding the referral for: CT chest

## 2014-02-11 NOTE — Progress Notes (Signed)
Subjective:    Patient ID: [name], male    DOB: 1945-05-01, 68 y.o.   MRN: [id number]  HPI  Here with 2 wks gradual worsening sob/doe, mild prod cough now greenish sputum, wheezing, fatigue. Pt denies chest pain, orthopnea, PND, increased LE swelling, palpitations, dizziness or syncope.  Still smoking 1 ppd x 50 yrs, not ready to quit. Pt denies new neurological symptoms such as new headache, or facial or extremity weakness or numbness   Pt denies polydipsia, polyuria  For start low dose CT lung Ca screening;  Per CMS guidelines, I have determined the eligibility including patient age (37-80), and absence of any signs of lung cancer.  Specific calculation of number of pack years is documented.  Quit smoking years is documented.  Shared decision making engaged today, including a discussion of the benefits and harms of screening, discussion of need for followup with additional testing, risks of over-diagnosis, risk of false-positive screening examinations, and risk of radiation exposure.  Counseling done today on the importance of adherence to annual lunge cancer LDCT screening, importance of quit smoking and remaining quit, and tobacco cessation instructions given.  There is also discussion with patient regarding the impact of comorbidities, and patient states is able and willing to undergo diagnosis and treatment.   Past Medical History  Diagnosis Date  . Hyperlipidemia   . Vitamin B12 deficiency   . Hypertensive heart disease 09/20/2008    Qualifier: Diagnosis of  By: Flonnie Overman    . CAD     a. s/p CABG;  b. NSTEMI 06/2013 - [facility]:  LIMA-LAD patent, SVG-D1 with 99% subtotal occlusion, SVG-OM occluded, proximal to mid circumflex 60-70%, terminal OM 70-80%, both OM branches grafted were occluded, proximal RCA occluded, SVG-right PDA with mid to distal 80-90% and more distal 70%, RPL1 proximal 60% => s/p DES to S-Dx - c/b TIA - staged intervention of S-RPDA placed on hold  . BRADYCARDIA       s/p pacer  . 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 TIA 09/20/2008    Qualifier: Diagnosis of  By: Flonnie Overman    . PSORIASIS 09/20/2008    Qualifier: Diagnosis of  By: Flonnie Overman    . Intractable hiccoughs 08/03/2010  . Dyslipidemia 09/05/2011  . GERD (gastroesophageal reflux disease) 11/06/2011  . Nephrolithiasis 11/06/2011  . Carotid stenosis 11/06/2011    Bilat 60-80% 2010;  Carotid US (06/2013):  RICA 1-39%;  LICA > 80% -  needs L CEA in 6-8 weeks >>> s/p L carotid stent 09/2013 (Dr. Darrick Penna)   . Chronic systolic heart failure   . DVT (deep venous thrombosis)   . Type II diabetes mellitus   . History of stomach ulcers   . Cerebellar stroke, acute ~ 2009    residual:  "hiccoughs" (06/09/2013)  . Mini stroke     "several/dr" (06/09/2013)  . Tobacco abuse   . PAD (peripheral artery disease)   . Ischemic cardiomyopathy     a. Echo (06/2013):  EF 20-25%, severe HK of anteroseptum, AK of anterolateral wall, some degree of AS, mild MR, mod LAE, mod TR   Past Surgical History  Procedure Laterality Date  . Insert / replace / remove pacemaker    . Femoral-femoral bypass graft  02/25/2012    Procedure: BYPASS GRAFT FEMORAL-FEMORAL ARTERY;  Surgeon: Fransisco Hertz, MD;  Location: [facility] OR;  Service: Vascular;  Laterality: N/A;  RIGHT TO LEFT  Using 8mm x 30 cm Hemashield Graft  . Femoral-tibial bypass graft  02/25/2012    Procedure: BYPASS GRAFT FEMORAL-TIBIAL ARTERY;  Surgeon: Fransisco HertzBrian L Chen, MD;  [facility];  Service: Vascular;  Laterality: Left;  . Endarterectomy femoral  02/25/2012    Procedure: ENDARTERECTOMY FEMORAL;  Surgeon: Fransisco HertzBrian L Chen, MD;  [facility];  Service: Vascular;  Laterality: Bilateral;  . Patch angioplasty  02/25/2012    Procedure: PATCH ANGIOPLASTY;  Surgeon: Fransisco HertzBrian L Chen, MD;  [facility];  Service: Vascular;  Laterality: Bilateral;  using  1 cm x 6 cm vascu guard patch angioplasty  . Intraoperative arteriogram   02/25/2012    Procedure: INTRA OPERATIVE ARTERIOGRAM;  Surgeon: Fransisco HertzBrian L Chen, MD;  [facility];  Service: Vascular;  Laterality: Left;  . Coronary artery bypass graft      "CABG X5"  . Coronary angioplasty  09/18/2006    WELL PRESERVED LEFT VENTRICULAR SYSTOLIC FUNCTION. THERE APPEARS TBE SOME HYPOKINESIS OF THE POSTERIOR WALL. EF 60%  . Coronary angioplasty with stent placement      "multiple"  . Kidney stone surgery      "cut them out"  . Lower extremity angiogram N/A 02/19/2012    Procedure: LOWER EXTREMITY ANGIOGRAM;  Surgeon: Fransisco HertzBrian L Chen, MD;  [facility];  Service: Cardiovascular;  Laterality: N/A;  . Left heart catheterization with coronary/graft angiogram N/A 06/11/2013    Procedure: LEFT HEART CATHETERIZATION WITH Isabel CapriceORONARY/GRAFT ANGIOGRAM;  Surgeon: Marykay Lexavid W Harding, MD;  [facility];  Service: Cardiovascular;  Laterality: N/A;  . Carotid angiogram Left 07/30/2013    Procedure: CAROTID ANGIOGRAM;  Surgeon: Fransisco HertzBrian L Chen, MD;  [facility];  Service: Cardiovascular;  Laterality: Left;  . Arch aortogram  07/30/2013    Procedure: ARCH AORTOGRAM;  Surgeon: Fransisco HertzBrian L Chen, MD;  [facility];  Service: Cardiovascular;;  . Carotid stent insertion Left 09/07/2013    Procedure: CAROTID STENT INSERTION;  Surgeon: Sherren Kernsharles E Fields, MD;  [facility];  Service: Cardiovascular;  Laterality: Left;    reports that he has been smoking Cigarettes.  He has a 28.5 pack-year smoking history. He has never used smokeless tobacco. He reports that he drinks alcohol. He reports that he does not use illicit drugs. family history includes COPD in his mother; Coronary artery disease in his father; Diabetes in his mother; Heart attack in his father and mother; Heart disease in his father and mother; Hyperlipidemia in his father and mother; Hypertension in his father and mother. Allergies  Allergen Reactions  . Imdur [Isosorbide Mononitrate] Other (See Comments)     REACTION: HEADACHES   . Metformin And Related Other (See Comments)    REACTION: ABDOMINAL CRAMPS   Current Outpatient Prescriptions on File Prior to Visit  Medication Sig Dispense Refill  . amLODipine (NORVASC) 5 MG tablet Take 5 mg by mouth daily.     Marland Kitchen. aspirin EC 81 MG tablet Take 81 mg by mouth daily.    Marland Kitchen. atorvastatin (LIPITOR) 80 MG tablet Take 80 mg by mouth daily.    . carvedilol (COREG) 6.25 MG tablet Take 1 tablet (6.25 mg total) by mouth 2 (two) times daily with a meal. 60 tablet 11  . clopidogrel (PLAVIX) 75 MG tablet Take 75 mg by mouth daily with breakfast.    . ezetimibe (ZETIA) 10 MG tablet Take 10 mg by mouth daily.    . furosemide (LASIX) 40 MG tablet Take 1 tablet (40 mg  total) by mouth 2 (two) times daily. 60 tablet 11  . losartan (COZAAR) 100 MG tablet Take 1 tablet (100 mg total) by mouth daily. 30 tablet 11  . meclizine (ANTIVERT) 25 MG tablet Take 25 mg by mouth 3 (three) times daily as needed for dizziness.     . meclizine (ANTIVERT) 25 MG tablet TAKE 1 TABLET BY MOUTH EVERY 8 HOURS AS NEEDED FOR DIZZINESS 30 tablet 2  . nitroGLYCERIN (NITROSTAT) 0.4 MG SL tablet Place 1 tablet (0.4 mg total) under the tongue every 5 (five) minutes as needed. For chest pain 25 tablet 3  . nystatin (MYCOSTATIN) 100000 UNIT/ML suspension Take 5 mLs (500,000 Units total) by mouth 4 (four) times daily. 60 mL 0  . spironolactone (ALDACTONE) 25 MG tablet Take 0.5 tablets (12.5 mg total) by mouth daily. 15 tablet 5   No current facility-administered medications on file prior to visit.   Review of Systems  Constitutional: Negative for unusual diaphoresis or other sweats  HENT: Negative for ringing in ear Eyes: Negative for double vision or worsening visual disturbance.  Respiratory: Negative for choking and stridor.   Gastrointestinal: Negative for vomiting or other signifcant bowel change Genitourinary: Negative for hematuria or decreased urine volume.  Musculoskeletal: Negative for  other MSK pain or swelling Skin: Negative for color change and worsening wound.  Neurological: Negative for tremors and numbness other than noted  Psychiatric/Behavioral: Negative for decreased concentration or agitation other than above       Objective:   Physical Exam BP 140/90 mmHg  Pulse 75  Temp(Src) 97.4 F (36.3 C) (Oral)  Ht 5\' 6"  (1.676 m)  Wt 162 lb (73.483 kg)  BMI 26.16 kg/m2  SpO2 99% VS noted, mild distress Constitutional: Pt appears well-developed, well-nourished.  HENT: Head: NCAT.  Right Ear: External ear normal.  Left Ear: External ear normal.  Eyes: . Pupils are equal, round, and reactive to light. Conjunctivae and EOM are normal Neck: Normal range of motion. Neck supple.  Bilat tm's with mild erythema.  Max sinus areas non tender.  Pharynx with mild erythema, no exudate Cardiovascular: Normal rate and regular rhythm.   Pulmonary/Chest: Effort normal and breath sounds decreased with bilat wheeze.  Neurological: Pt is alert. Not confused , motor grossly intact Skin: Skin is warm. No rash Psychiatric: Pt behavior is normal. No agitation.     Assessment & Plan:

## 2014-02-12 ENCOUNTER — Telehealth: Payer: Self-pay | Admitting: Internal Medicine

## 2014-02-12 NOTE — Telephone Encounter (Signed)
emmi mailed  °

## 2014-02-14 NOTE — Assessment & Plan Note (Signed)
Mild to mod, for antibx course, predpack asd, depomedrol IM, cough med prn,  to f/u any worsening symptoms or concerns

## 2014-02-14 NOTE — Assessment & Plan Note (Signed)
stable overall by history and exam, recent data reviewed with pt, and pt to continue medical treatment as before,  to f/u any worsening symptoms or concerns BP Readings from Last 3 Encounters:  02/11/14 140/90  11/13/13 118/78  10/28/13 138/74

## 2014-02-14 NOTE — Assessment & Plan Note (Signed)
Mild to mod, for antibx course,  to f/u any worsening symptoms or concerns 

## 2014-02-14 NOTE — Assessment & Plan Note (Signed)
Chronic smoker - for Low dose chest CT yearly screening

## 2014-02-16 ENCOUNTER — Telehealth: Payer: Self-pay | Admitting: *Deleted

## 2014-02-16 NOTE — Telephone Encounter (Signed)
[facility] - Client TELEPHONE ADVICE RECORD [facility] Patient Name: Wilmer FloorROGER Shackett Gender: Male DOB: 1945-04-30 Age: 68 Y 11 M 16 D Return Phone Number: Address: [address] [place] [id number] Client [facility] - Client Client Site [facility] - Night Physician Oliver BarreJohn, James Contact Type Call Call Type Triage / Clinical Caller Name [place] Travis Relationship To Patient Other Return Phone Number Unavailable Chief Complaint DEATH - has occurred or is imminent or pending Initial Comment [facility] Needs [facility] death [facility]. CBN: [phone] Nurse Assessment Guidelines Guideline Title Affirmed Question Affirmed Notes Nurse Date/Time (Eastern Time) Disp. Time Lamount Cohen(Eastern Time) Disposition Final User November 04, 2013 10:43:42 PM Send to Urgent Rockney GheeQueue Haynes, Erika November 04, 2013 10:48:02 PM Called On-Call Provider Deretha Emoryhambers, RN, Judy November 04, 2013 10:49:16 PM Clinical Call Yes Deretha Emoryhambers, RN, Darel HongJudy After Care Instructions Given Call Event Type User Date / Time Description Paging DoctorName DoctorPhone DateTime Result/Outcome Notes Duncan Dullullo, Teresa [phone] November 04, 2013 10:48:02 PM Called On Call Provider - Reached Duncan Dullullo, Teresa November 04, 2013 10:48:55 PM Spoke with On Call - General informed of [facility] warm transferred her to [place]had Travis

## 2014-02-24 ENCOUNTER — Telehealth: Payer: Self-pay

## 2014-02-24 NOTE — Telephone Encounter (Signed)
Received Death certificate for Dr. Jonny RuizJohn to sign 02/24/14  [facility] 02/24/2014

## 2014-03-05 DEATH — deceased

## 2014-03-18 ENCOUNTER — Encounter (HOSPITAL_COMMUNITY): Payer: Self-pay | Admitting: Vascular Surgery

## 2014-04-02 ENCOUNTER — Other Ambulatory Visit (HOSPITAL_COMMUNITY): Payer: Medicare Other

## 2014-04-02 ENCOUNTER — Encounter (HOSPITAL_COMMUNITY): Payer: Medicare Other

## 2014-04-02 ENCOUNTER — Ambulatory Visit: Payer: Medicare Other | Admitting: Family

## 2014-08-23 IMAGING — CT CT HEAD W/O CM
1 series · 16 of 30 positions shown, 20 images · non-contrast
Comparison: Prior CT from 02/20/2012

CLINICAL DATA: stroke

EXAM:
CT HEAD WITHOUT CONTRAST
TECHNIQUE: Contiguous axial images were obtained from the base of the skull
through the vertex without intravenous contrast.

[Series 2: head 5.0 h30s · axial · 0.44mm/px · z∈[+1202,+1352]mm · 16 of 34 slices shown, 20 images]
[im 2/34  brain]
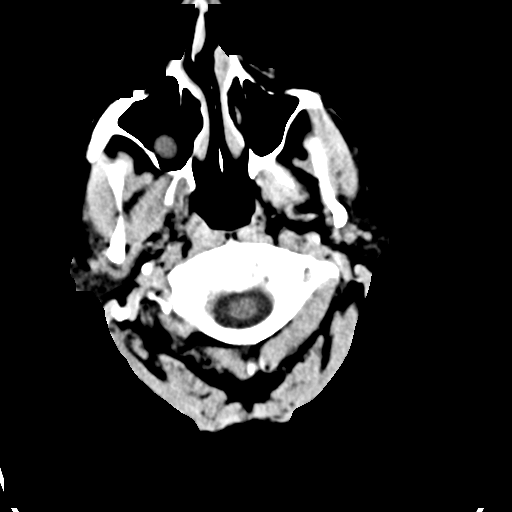
[im 2/34  bone]
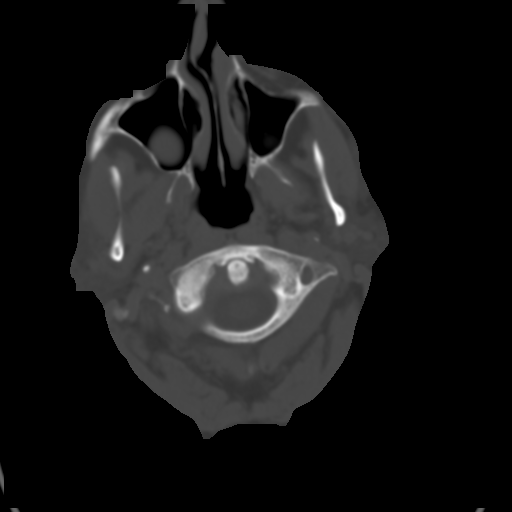
[im 4/34  brain]
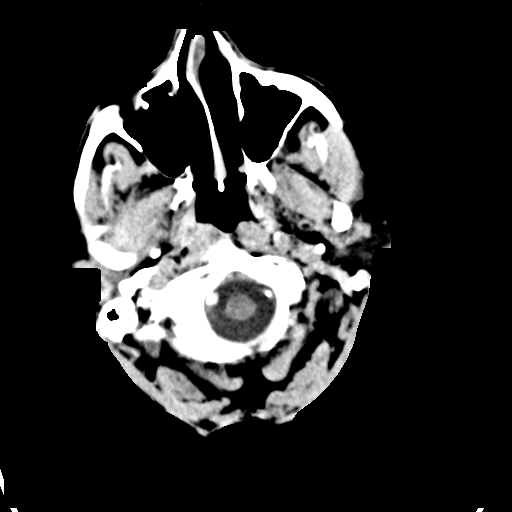
[im 6/34  brain]
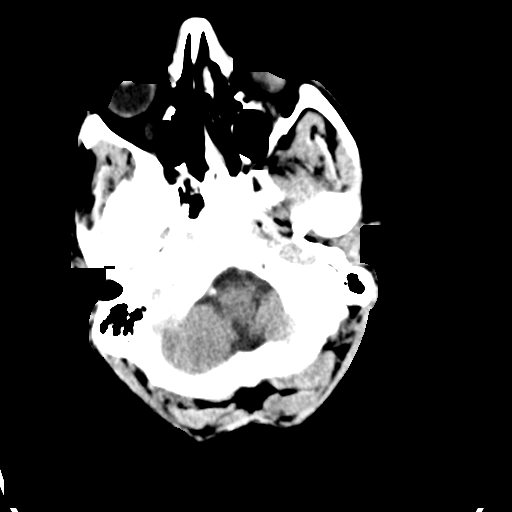
[im 8/34  brain]
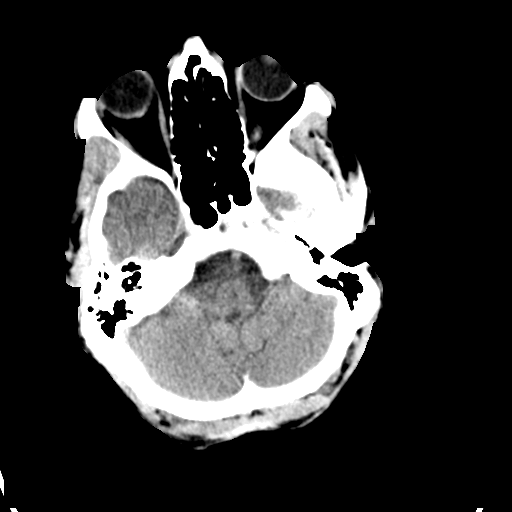
[im 10/34  brain]
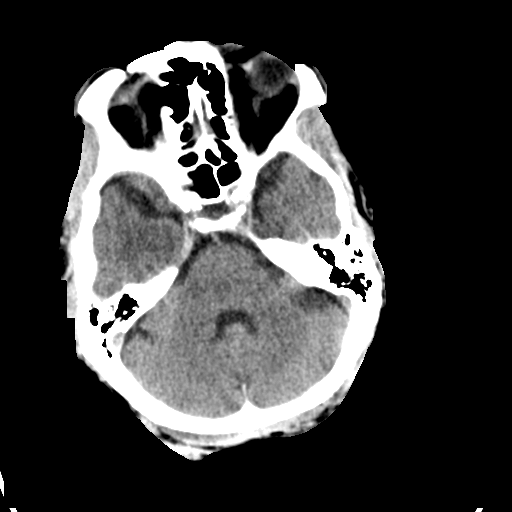
[im 10/34  bone]
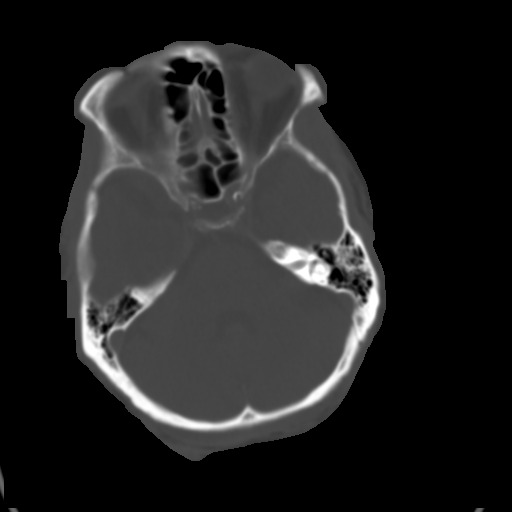
[im 12/34  brain]
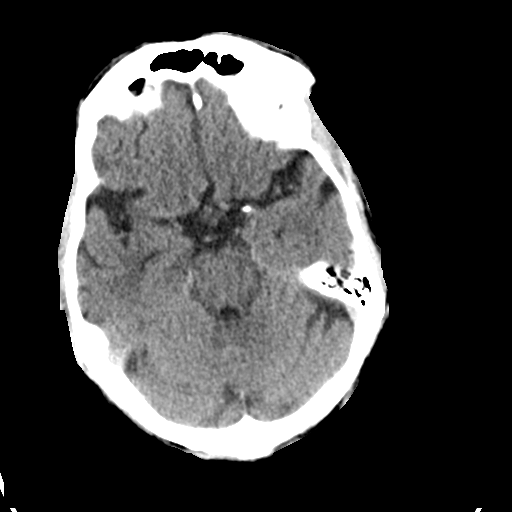
[im 14/34  brain]
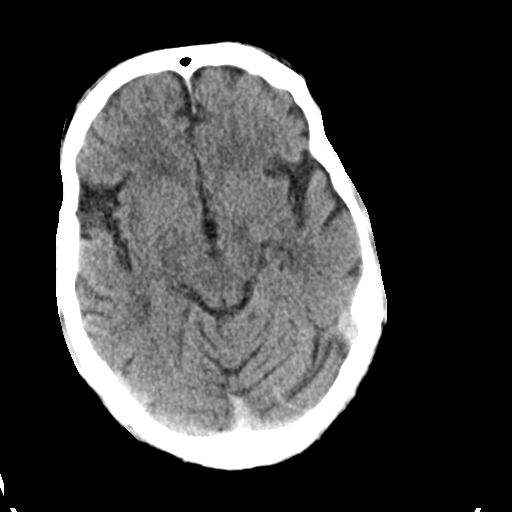
[im 16/34  brain]
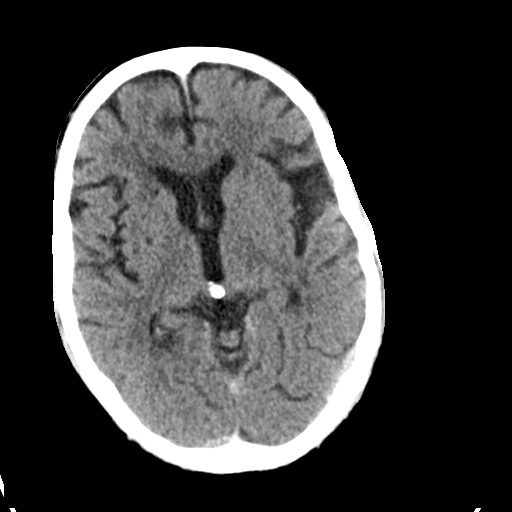
[im 18/34  brain]
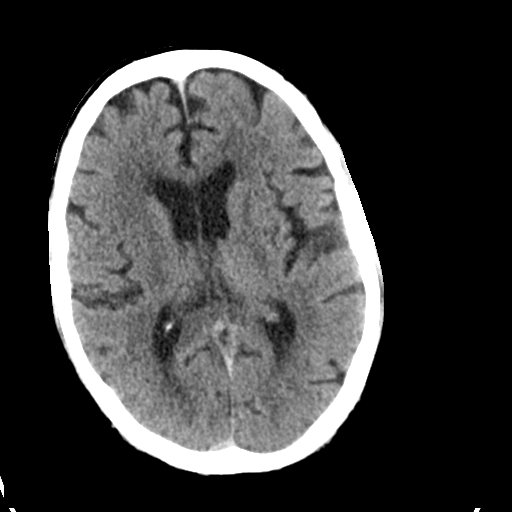
[im 18/34  bone]
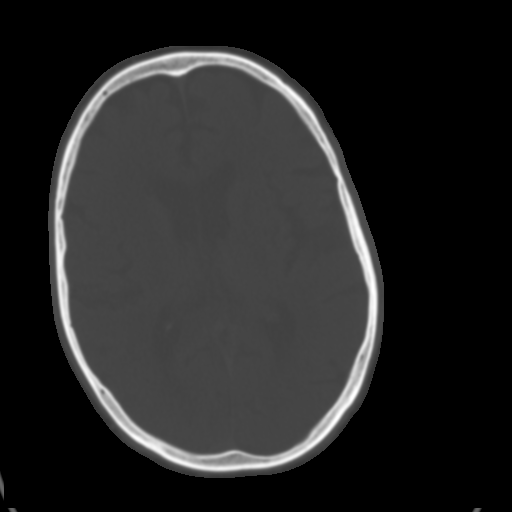
[im 20/34  brain]
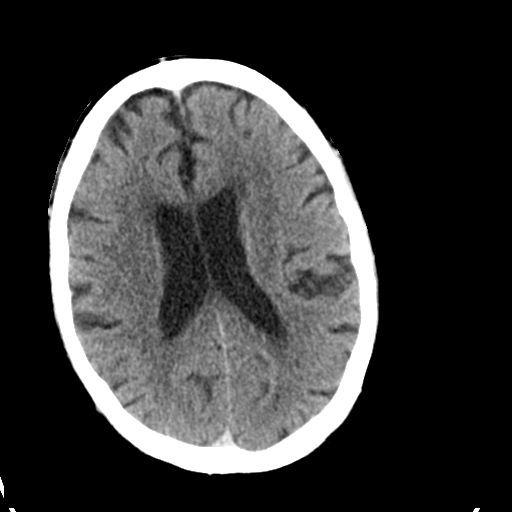
[im 22/34  brain]
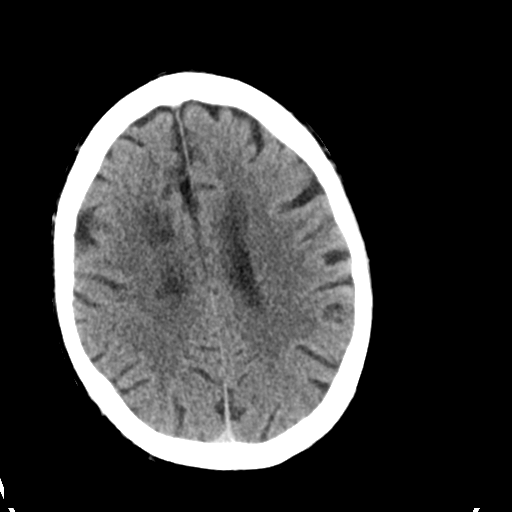
[im 24/34  brain]
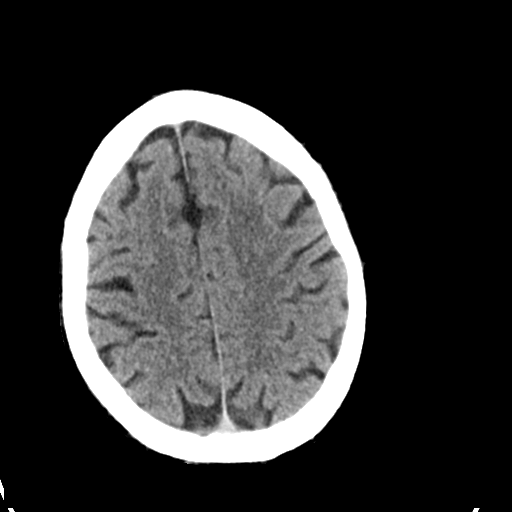
[im 26/34  brain]
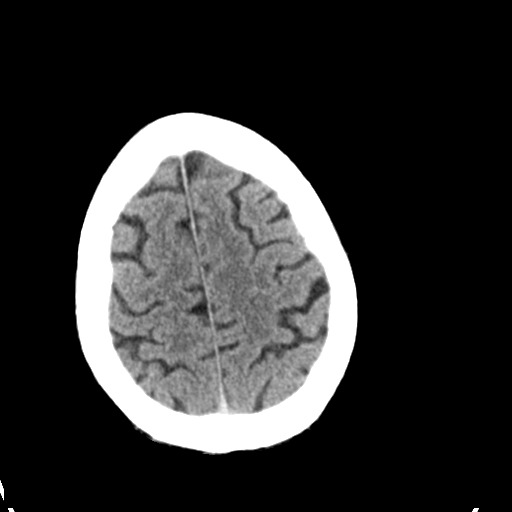
[im 26/34  bone]
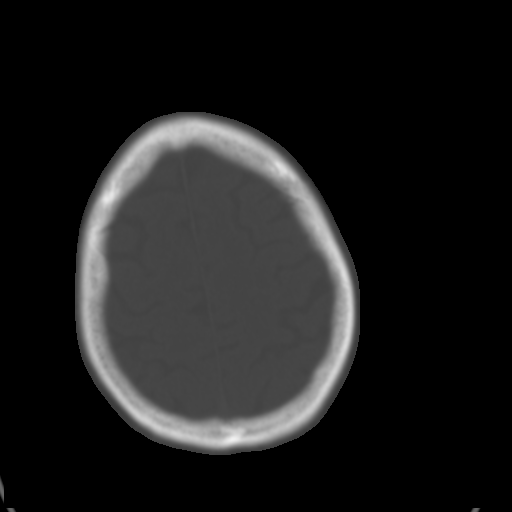
[im 28/34  brain]
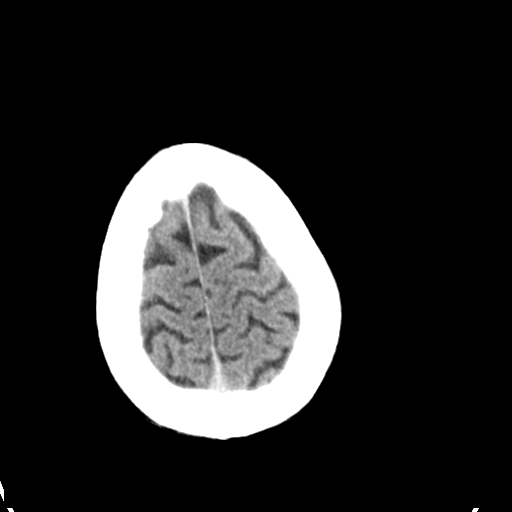
[im 30/34  brain]
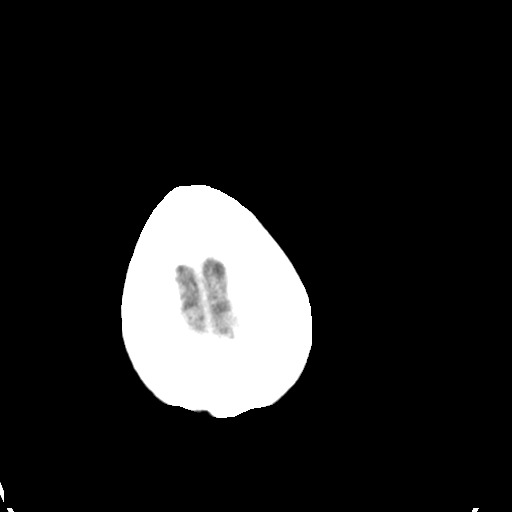
[im 32/34  brain]
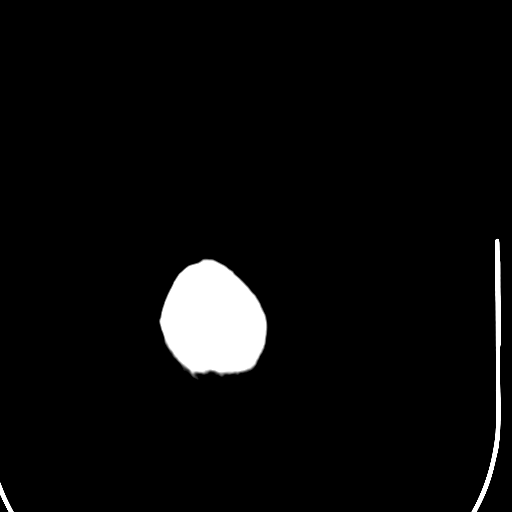

[16 of 30 positions shown; findings below may reference images not displayed]

FINDINGS: Mild generalized cerebral atrophy with chronic microvascular
ischemic disease is present, similar to prior.

There is no acute intracranial hemorrhage or infarct. No mass lesion
or midline shift. Gray-white matter differentiation is well
maintained. Ventricles are normal in size without evidence of
hydrocephalus. CSF containing spaces are within normal limits. No
extra-axial fluid collection.

The calvarium is intact.

Orbital soft tissues are within normal limits.

Probable retention cyst noted within the partially visualized
maxillary sinuses bilaterally. Scattered opacity noted within the
ethmoidal air cells bilaterally. No mastoid effusion.

Scalp soft tissues are unremarkable.
IMPRESSION: 1. No acute intracranial process.
2. Mild generalized cerebral atrophy with chronic microvascular
ischemic disease.
